# Patient Record
Sex: Female | Born: 1935 | Race: White | Hispanic: No | Smoking: Former smoker
Health system: Southern US, Community
[De-identification: ages and names within clinical notes are randomized; demographics above are authoritative.]

## PROBLEM LIST (undated history)

## (undated) DIAGNOSIS — R350 Frequency of micturition: Secondary | ICD-10-CM

## (undated) DIAGNOSIS — R112 Nausea with vomiting, unspecified: Secondary | ICD-10-CM

## (undated) DIAGNOSIS — T884XXA Failed or difficult intubation, initial encounter: Secondary | ICD-10-CM

## (undated) DIAGNOSIS — I10 Essential (primary) hypertension: Secondary | ICD-10-CM

## (undated) DIAGNOSIS — E079 Disorder of thyroid, unspecified: Secondary | ICD-10-CM

## (undated) DIAGNOSIS — Z9889 Other specified postprocedural states: Secondary | ICD-10-CM

## (undated) DIAGNOSIS — M1990 Unspecified osteoarthritis, unspecified site: Secondary | ICD-10-CM

## (undated) DIAGNOSIS — C801 Malignant (primary) neoplasm, unspecified: Secondary | ICD-10-CM

## (undated) DIAGNOSIS — K219 Gastro-esophageal reflux disease without esophagitis: Secondary | ICD-10-CM

## (undated) DIAGNOSIS — E039 Hypothyroidism, unspecified: Secondary | ICD-10-CM

## (undated) DIAGNOSIS — M810 Age-related osteoporosis without current pathological fracture: Secondary | ICD-10-CM

## (undated) HISTORY — PX: PAROTIDECTOMY: SUR1003

## (undated) HISTORY — PX: ABDOMINAL HYSTERECTOMY: SHX81

## (undated) HISTORY — DX: Unspecified osteoarthritis, unspecified site: M19.90

## (undated) HISTORY — PX: CAROTID BODY TUMOR EXCISION: SHX5156

## (undated) HISTORY — PX: COLONOSCOPY W/ POLYPECTOMY: SHX1380

## (undated) HISTORY — DX: Age-related osteoporosis without current pathological fracture: M81.0

## (undated) HISTORY — DX: Gastro-esophageal reflux disease without esophagitis: K21.9

## (undated) HISTORY — PX: EYE SURGERY: SHX253

## (undated) HISTORY — DX: Essential (primary) hypertension: I10

## (undated) HISTORY — DX: Disorder of thyroid, unspecified: E07.9

---

## 2000-02-19 ENCOUNTER — Other Ambulatory Visit: Admission: RE | Admit: 2000-02-19 | Discharge: 2000-02-19 | Payer: Self-pay | Admitting: Gastroenterology

## 2000-02-22 ENCOUNTER — Encounter (INDEPENDENT_AMBULATORY_CARE_PROVIDER_SITE_OTHER): Payer: Self-pay

## 2000-02-22 ENCOUNTER — Other Ambulatory Visit: Admission: RE | Admit: 2000-02-22 | Discharge: 2000-02-22 | Payer: Self-pay | Admitting: Gastroenterology

## 2000-11-16 ENCOUNTER — Other Ambulatory Visit: Admission: RE | Admit: 2000-11-16 | Discharge: 2000-11-16 | Payer: Self-pay | Admitting: Obstetrics and Gynecology

## 2001-01-23 ENCOUNTER — Other Ambulatory Visit: Admission: RE | Admit: 2001-01-23 | Discharge: 2001-01-23 | Payer: Self-pay | Admitting: Obstetrics and Gynecology

## 2001-01-23 ENCOUNTER — Encounter: Payer: Self-pay | Admitting: Obstetrics and Gynecology

## 2001-01-23 ENCOUNTER — Encounter: Admission: RE | Admit: 2001-01-23 | Discharge: 2001-01-23 | Payer: Self-pay | Admitting: Obstetrics and Gynecology

## 2001-08-11 ENCOUNTER — Other Ambulatory Visit: Admission: RE | Admit: 2001-08-11 | Discharge: 2001-08-11 | Payer: Self-pay | Admitting: *Deleted

## 2002-02-15 ENCOUNTER — Other Ambulatory Visit: Admission: RE | Admit: 2002-02-15 | Discharge: 2002-02-15 | Payer: Self-pay | Admitting: *Deleted

## 2002-12-26 ENCOUNTER — Encounter: Payer: Self-pay | Admitting: Gastroenterology

## 2003-01-16 ENCOUNTER — Encounter: Payer: Self-pay | Admitting: Gastroenterology

## 2003-03-05 ENCOUNTER — Other Ambulatory Visit: Admission: RE | Admit: 2003-03-05 | Discharge: 2003-03-05 | Payer: Self-pay | Admitting: *Deleted

## 2004-03-25 ENCOUNTER — Other Ambulatory Visit: Admission: RE | Admit: 2004-03-25 | Discharge: 2004-03-25 | Payer: Self-pay | Admitting: *Deleted

## 2004-05-10 HISTORY — PX: JOINT REPLACEMENT: SHX530

## 2004-07-16 ENCOUNTER — Other Ambulatory Visit: Admission: RE | Admit: 2004-07-16 | Discharge: 2004-07-16 | Payer: Self-pay | Admitting: *Deleted

## 2005-01-13 ENCOUNTER — Other Ambulatory Visit: Admission: RE | Admit: 2005-01-13 | Discharge: 2005-01-13 | Payer: Self-pay | Admitting: *Deleted

## 2005-01-18 ENCOUNTER — Inpatient Hospital Stay (HOSPITAL_COMMUNITY): Admission: RE | Admit: 2005-01-18 | Discharge: 2005-01-22 | Payer: Self-pay | Admitting: Orthopedic Surgery

## 2005-02-04 ENCOUNTER — Encounter: Payer: Self-pay | Admitting: Gastroenterology

## 2005-02-22 ENCOUNTER — Encounter: Admission: RE | Admit: 2005-02-22 | Discharge: 2005-04-23 | Payer: Self-pay | Admitting: Orthopedic Surgery

## 2005-08-04 ENCOUNTER — Other Ambulatory Visit: Admission: RE | Admit: 2005-08-04 | Discharge: 2005-08-04 | Payer: Self-pay | Admitting: *Deleted

## 2006-06-21 ENCOUNTER — Other Ambulatory Visit: Admission: RE | Admit: 2006-06-21 | Discharge: 2006-06-21 | Payer: Self-pay | Admitting: *Deleted

## 2006-07-05 ENCOUNTER — Ambulatory Visit: Payer: Self-pay | Admitting: Vascular Surgery

## 2007-03-29 ENCOUNTER — Encounter: Payer: Self-pay | Admitting: Gastroenterology

## 2007-07-12 ENCOUNTER — Other Ambulatory Visit: Admission: RE | Admit: 2007-07-12 | Discharge: 2007-07-12 | Payer: Self-pay | Admitting: *Deleted

## 2007-07-20 ENCOUNTER — Ambulatory Visit: Payer: Self-pay | Admitting: Surgery

## 2007-11-22 DIAGNOSIS — K219 Gastro-esophageal reflux disease without esophagitis: Secondary | ICD-10-CM | POA: Insufficient documentation

## 2007-11-22 DIAGNOSIS — Z8719 Personal history of other diseases of the digestive system: Secondary | ICD-10-CM | POA: Insufficient documentation

## 2007-11-22 DIAGNOSIS — K5730 Diverticulosis of large intestine without perforation or abscess without bleeding: Secondary | ICD-10-CM

## 2007-11-22 DIAGNOSIS — K2900 Acute gastritis without bleeding: Secondary | ICD-10-CM | POA: Insufficient documentation

## 2007-11-22 DIAGNOSIS — R1011 Right upper quadrant pain: Secondary | ICD-10-CM

## 2007-11-22 DIAGNOSIS — Z860100 Personal history of colon polyps, unspecified: Secondary | ICD-10-CM | POA: Insufficient documentation

## 2007-11-22 DIAGNOSIS — M810 Age-related osteoporosis without current pathological fracture: Secondary | ICD-10-CM | POA: Insufficient documentation

## 2007-11-22 DIAGNOSIS — D509 Iron deficiency anemia, unspecified: Secondary | ICD-10-CM

## 2007-11-22 DIAGNOSIS — K449 Diaphragmatic hernia without obstruction or gangrene: Secondary | ICD-10-CM

## 2007-11-22 DIAGNOSIS — M940 Chondrocostal junction syndrome [Tietze]: Secondary | ICD-10-CM | POA: Insufficient documentation

## 2007-11-22 DIAGNOSIS — N951 Menopausal and female climacteric states: Secondary | ICD-10-CM | POA: Insufficient documentation

## 2007-12-18 ENCOUNTER — Ambulatory Visit: Payer: Self-pay | Admitting: Gastroenterology

## 2007-12-18 DIAGNOSIS — R1319 Other dysphagia: Secondary | ICD-10-CM

## 2007-12-18 DIAGNOSIS — K828 Other specified diseases of gallbladder: Secondary | ICD-10-CM

## 2007-12-18 DIAGNOSIS — K649 Unspecified hemorrhoids: Secondary | ICD-10-CM

## 2007-12-20 ENCOUNTER — Telehealth: Payer: Self-pay | Admitting: Gastroenterology

## 2007-12-20 ENCOUNTER — Ambulatory Visit (HOSPITAL_COMMUNITY): Admission: RE | Admit: 2007-12-20 | Discharge: 2007-12-20 | Payer: Self-pay | Admitting: Gastroenterology

## 2007-12-22 ENCOUNTER — Ambulatory Visit: Payer: Self-pay | Admitting: Gastroenterology

## 2008-01-16 ENCOUNTER — Telehealth: Payer: Self-pay | Admitting: Gastroenterology

## 2008-01-18 ENCOUNTER — Encounter: Payer: Self-pay | Admitting: Gastroenterology

## 2008-08-14 ENCOUNTER — Ambulatory Visit (HOSPITAL_COMMUNITY): Admission: RE | Admit: 2008-08-14 | Discharge: 2008-08-15 | Payer: Self-pay | Admitting: Otolaryngology

## 2008-08-14 ENCOUNTER — Encounter (INDEPENDENT_AMBULATORY_CARE_PROVIDER_SITE_OTHER): Payer: Self-pay | Admitting: Otolaryngology

## 2009-03-06 ENCOUNTER — Ambulatory Visit (HOSPITAL_COMMUNITY): Admission: RE | Admit: 2009-03-06 | Discharge: 2009-03-06 | Payer: Self-pay | Admitting: Ophthalmology

## 2010-08-13 LAB — BASIC METABOLIC PANEL
Calcium: 9.4 mg/dL (ref 8.4–10.5)
Creatinine, Ser: 0.89 mg/dL (ref 0.4–1.2)
GFR calc Af Amer: >60 mL/min (ref >60)

## 2010-08-13 LAB — HEMOGLOBIN AND HEMATOCRIT, BLOOD
HCT: 34.8 % — ABNORMAL LOW (ref 36.0–46.0)
Hemoglobin: 11.9 g/dL — ABNORMAL LOW (ref 12.0–15.0)

## 2010-08-19 LAB — CBC
HCT: 37.4 % (ref 36.0–46.0)
Hemoglobin: 12.6 g/dL (ref 12.0–15.0)
MCHC: 33.9 g/dL (ref 30.0–36.0)
RBC: 4.17 MIL/uL (ref 3.87–5.11)
RDW: 14.7 % (ref 11.5–15.5)
WBC: 7.4 10*3/uL (ref 4.0–10.5)

## 2010-08-19 LAB — BASIC METABOLIC PANEL
BUN: 20 mg/dL (ref 6–23)
Calcium: 9.5 mg/dL (ref 8.4–10.5)
Creatinine, Ser: 0.98 mg/dL (ref 0.4–1.2)
GFR calc non Af Amer: 56 mL/min — ABNORMAL LOW (ref >60)

## 2010-09-22 NOTE — Op Note (Signed)
NAMEKRISTAIN, FILO                  ACCOUNT NO.:  [id number]   MEDICAL RECORD NO.:  [id number]          PATIENT TYPE:  AMB   LOCATION:  SDS                          FACILITY:  [facility]   PHYSICIAN:  Jefry H. Pollyann Kennedy, MD     DATE OF BIRTH:  Jul 26, 1933   DATE OF PROCEDURE:  DATE OF DISCHARGE:                               OPERATIVE REPORT   PREOPERATIVE DIAGNOSIS:  Left parotid mass.   POSTOPERATIVE DIAGNOSIS:  Left parotid mass.   PROCEDURE:  Left parotidectomy.   SURGEON:  Jefry H. Pollyann Kennedy, MD   ASSISTANT:  Gloris Manchester. Lazarus Salines, MD   ANESTHESIA:  General endotracheal anesthesia was used.   COMPLICATIONS:  No complications.   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  The tumor split out the lower division of the nerve,  but all branches were counted for and none were sacrificed.   REFERRING PHYSICIAN:  Molly Maduro Day, MD   HISTORY:  Several-month history of a left-sided facial mass that was  enlarging.  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  Risks, benefits, alternatives, and complications of the procedure was  explained to the patient who seemed to understand and agreed to surgery.   PROCEDURE:  The patient was taken to the operating room and placed on  the operating table in supine position.  Following induction of the  general endotracheal anesthesia, the table was turned, and the patient  was prepped and draped in the standard fashion for parotidectomy.  A  left preauricular incision was outlined with the marking pen with  continuation up around the lobule and down along the cervical skin  crease about 2 fingerbreadths below the angle of the mandible.  Electrocautery was used for skin incision and for the remainder of the  dissection.  Skin flap was brought anteriorly beyond the gland.  This  was secured in place with silk suture.  The lobule was  dissected off of  the gland as well and tacked back with the suture as well.  Great  auricular nerve was not preserved.  The parotid was dissected off of the  sternocleidomastoid muscle and off of the ear canal, cartilage, and  bone.  The main trunk of the facial nerve was identified and was  carefully followed out towards face using McCabe dissector and bipolar  cautery with scissor dissection.  The upper division was dissected  first, identifying 3 branches, and making sure that the gland was clear  of these branches prior to dissecting and bringing the specimen  inferiorly.  The lower division was as described above splayed out  partially by the tumor.  The lateral lobe of the gland was dissected out  first and then the tumor was dissected out.  All branches were followed  within the [facility] and dissected free of the gland and the tumor.  The  tumor was removed and sent for frozen section evaluation.  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  The  wound was checked for any bleeding with positive pressure ventilation  and bipolar cautery was used as needed for hemostasis.  The facial vein  was ligated between clamps and divided with silk ligature.  Facial nerve  was assessed using a battery operated stimulator and all branches had  good movement.  The wound was closed in layers using 4-0 chromic on the  deep layer and Dermabond on the skin.  A 7 [place] round JP drain was  left in the wound, taken through the inferior aspect of the incision,  secured in place with a nylon suture.  The patient was awakened from  anesthesia and extubated and transferred to recovery in stable  condition.      Jefry H. Pollyann Kennedy, MD  Electronically Signed     JHR/MEDQ  D:  08/14/2008  T:  08/15/2008  Job:  [id number]   cc:   Alfredia Client, MD

## 2010-09-25 NOTE — Op Note (Signed)
NAMELUJUANA, KAPLER                  ACCOUNT NO.:  [id number]   MEDICAL RECORD NO.:  [id number]          PATIENT TYPE:  INP   LOCATION:  5016                         FACILITY:  [facility]   PHYSICIAN:  Feliberto Gottron. Turner Daniels, M.D.   DATE OF BIRTH:  11/25/1933   DATE OF PROCEDURE:  01/18/2005  DATE OF DISCHARGE:                                 OPERATIVE REPORT   PREOPERATIVE DIAGNOSIS:  End-stage osteoarthritis of the right knee.   POSTOPERATIVE DIAGNOSIS:  End-stage osteoarthritis of the right knee.   PROCEDURE:  Right total knee arthroplasty using DePuy sigma RP cemented  components, number 2.5 right femur, number 3 tibia, 10 mm Sigma RP spacer  and a 38 mm patellar button.   SURGEON:  Feliberto Gottron. Turner Daniels, M.D.   ASSISTANT:  Skip Mayer, PA-C.   ANESTHETIC:  General endotracheal.   ESTIMATED BLOOD LOSS:  Minimal.   FLUID REPLACEMENT:  1500 mL crystalloid.   DRAINS PLACED:  Foley catheter and two medium hemostats.   TOURNIQUET TIME:  1 hour and 20 minutes   INDICATIONS FOR PROCEDURE:  77 year old woman followed in our office for end  stage arthritis of the right knee, primarily the patellofemoral joint also  the medial and lateral compartment with very prominent peripheral  osteophytes.  She has failed conservative treatment, anti-inflammatory  medicines, physical therapy, judicious use of narcotics and now desires  elective right total knee arthroplasty to decrease pain and increase  function.  Surgery discussed prior to surgical intervention and all  questions were answered.   DESCRIPTION OF PROCEDURE:  The patient identified by armband, taken the  operating room at [facility] main hospital where the appropriate anesthetic  monitors were attached and general endotracheal anesthesia was induced.  A  lateral post and a foot positioner applied to the table after first applying  a tourniquet high to the right thigh. The right lower extremity then prepped  and draped in the usual sterile fashion  from the ankle to the midthigh and  then using an Esmarch bandage, the limb was exsanguinated, the knee bent and  the tourniquet inflated 350 mmHg. Anterior midline incision about 20 cm in  length was then made from a handbreadth above the patella over the patella  and just distal to and medial to the tibial tubercle through the skin and  subcutaneous tissue. The transverse retinaculum over the patella was then  incised and reflected medially allowing medial parapatellar arthrotomy.  The  patella was everted, the prepatellar fat pad resected and the superficial  medial collateral ligament was elevated off the proximal tibia using the  electrocautery keeping it intact distally stripping over a distance of about  2 cm of the anterior to posterior towards the semimembranosus.  With the  patella everted, the knee was then hyperflexed allowing [place] remove some  exuberant peripheral osteophytes from about the femur and the notch area as  well as the patella which was down to bare bone and eroded and had very  large osteophytes. Once this had been accomplished the tibia was translated  anteriorly using a posterior medial Z  retractor a McHale retractor through  the notch and the lateral Homan retractor.  The proximal tibia was then  instrumented with the DePuy step drill followed by intramedullary rod and 0  degrees posterior slope cutting guide. This was pinned into place allowing  resection of 6 to 7 mm of bone medially and about 8 to 9 mm of bone  laterally. Posterior structures were protected with retractors and after the  tibial resection, we entered the distal femur 2 mm anterior to the PCL  origin with a step drill followed by the intramedullary rod and a 5 degrees  right distal femoral cutting guide set at 10 mm. This was pinned into place  along the epicondylar axis and the distal femoral cut accomplished.  We  measured for a number 2.5 DePuy Sigma RP distal femoral component and placed  the  distal femoral pins in neutral rotation. The chamfer cutting guide was  then brought up and the anterior, posterior and chamfer cuts accomplished  without difficulty followed by the Sigma RP box cut. The everted patella was  then grasped with a patellar cutting guide set at 15 which allowed [place] to  shave just a thin wafer of bone from where the erosion was of the patella  and performing a flat patellar cut. We sized for a 38 mm patellar button and  drilled.  At this point the knee was once again hyperflexed, the tibia  translated forward with a retractor as we sized for #3 tibial tray which was  then pinned into place followed by the smokestack and the reverse conical  ream.  The bullet and Delta fit keel punch was then hammered into place and  we performed trial with a right 2.5 femur, a #3 tibial baseplate, a 10-mm  Sigma RP spacer and 38 mm patellar button. All components fit nicely. The  knee did come to full extension and flexed about 130 degrees limited by  adipose tissue.  At this point the trial components were removed and all  bony surfaces were water picked clean and dried with suction and sponges.  Meanwhile a double batch of the DePuy high viscosity polymethyl methacrylate  cement with 1500 mg of Zinacef was mixed at the back table and applied to  all bony and metallic mating surfaces except for the posterior condyles of  the femur.  In order we then hammered into place the number 3 tibial  component and removed excess cement, the 2.5 right femoral component and  removed excess cement and 38 mm patellar button.  The rotating platform  bearing 10 mm was then inserted and the knee held in 5 degrees of flexion  with compression applied at the foot as the cement cured.  The wound was  then water picked clean once again.  Medium Hemovac drains were placed from  a lateral approach.  The parapatellar arthrotomy closed with running #1 Vicryl suture. The subcutaneous tissue  with 0 and 2-0  undyed Vicryl suture  and the skin with skin staples. A dressing of Xeroform, 4x4 dressing  sponges, Webril and Ace wrap applied. The patient was awakened and taken to  the recovery room without difficulty.      Feliberto Gottron. Turner Daniels, M.D.  Electronically Signed     FJR/MEDQ  D:  01/18/2005  T:  01/19/2005  Job:  [id number]

## 2010-09-25 NOTE — Discharge Summary (Signed)
Darlene Gregory, Darlene Gregory                  ACCOUNT NO.:  [id number]   MEDICAL RECORD NO.:  [id number]          PATIENT TYPE:  INP   LOCATION:  5016                         FACILITY:  [facility]   PHYSICIAN:  Feliberto Gottron. Turner Daniels, M.D.   DATE OF BIRTH:  10-14-1933   DATE OF ADMISSION:  01/18/2005  DATE OF DISCHARGE:  01/22/2005                                 DISCHARGE SUMMARY   PRIMARY DIAGNOSIS THIS ADMISSION:  End-stage degenerative joint disease of  the right knee.   SECONDARY DIAGNOSES:  1.  Urinary tract infection.  2.  History of diverticulitis.  3.  History of esophageal reflux.   HISTORY OF PRESENT ILLNESS:  The patient is a 77 year old woman followed by  Dr. Turner Daniels for end-stage arthritis of the right knee, primarily of the  patellofemoral joint as well as the medial and lateral compartments, with  very prominent peripheral osteophyte.  The patient failed conservative  treatment, anti-inflammatory medications, physical therapy, judicious use of  narcotics, and now desires elective right total knee arthroplasty to  decrease pain and increase function.  The surgery was discussed prior to  surgical intervention and all questions were answered and the risks and  benefits were discussed.   The patient is allergic to SULFA and PYRIDIUM.   Medications at the time of admission include omeprazole, Benicar, Fortical  and __________.   PAST MEDICAL HISTORY:  Usual childhood diseases as well as history of  hypertension, DJD, anemia, hiatal hernia.   SURGICAL HISTORY:  Hysterectomy, D&C, ORIF of both legs.  No difficulties  with GET.   SOCIAL HISTORY:  No tobacco, no ethanol, no IV drug abuse.  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   FAMILY HISTORY:  Her mother died at 95 with positive history of coronary  artery disease and hypertension.  Father died in his 26s with a history of  COPD.   REVIEW OF SYSTEMS:  Positive for glasses and difficulty swallowing.  Denies  any shortness of  breath, chest pain or recent illness.   PHYSICAL EXAMINATION:  VITAL SIGNS:  The patient's blood pressure is 160/90,  respirations are 18.  She is afebrile.  She is a 5 foot 4 inch, 180 pound  woman.  HEENT:  Head is normocephalic, atraumatic.  Ears:  TMs are clear.  Eyes:  Pupils equal, round, and reactive to light and accommodation.  Nose patent.  Throat benign.  NECK:  Supple, full range of motion.  CHEST:  Clear to auscultation and percussion.  CARDIAC:  Regular rate and rhythm, no murmurs.  ABDOMEN:  Soft, nontender, bowel sounds 2+.  EXTREMITIES:  Right knee range of motion 0-115 degrees with positive  crepitus, trace effusion.  Stable ligamentously, 10 degrees of varus  deformity.  SKIN:  No breaks in the skin.   X-rays showed bone-on-bone changes with some changes in all compartments.  Preoperative labs including CBC, CMET, chest x-ray, EKG, PT/PTT, were all  within normal limits except for sinus bradycardia in the EKG.   HOSPITAL COURSE:  On the day of admission the patient was taken to  the  operating room at [facility], where she underwent a right total knee  arthroplasty using DePuy Sigma components, a #2.5 right femur, #3 tibia, a  10 mm Sigma spacer and a 38 mm patellar button.  All components cemented,  and the patient was placed on perioperative antibiotics, placed on  postoperative Coumadin prophylaxis with a target INR of 1.5-2.  She had a  medium Hemovac, double-armed, placed into the knee and a Foley catheter  placed preoperatively.  She was placed on a postoperative PCA of Dilaudid  for pain control and physical therapy was begun in the postoperative care  area with CPM.  Postoperative day 1, the patient was awake and alert, no  nausea or vomiting, using incentive spirometer well.  Vital signs were  stable, hemoglobin 10.9.  She was afebrile.  PT 15.3.  Dressing was dry,  drain output was 250 mL.  PAS stockings in place.  She was otherwise stable  and  continued with physical therapy, weightbearing as tolerated with total  knee precautions.  Postoperative day 2, the patient was complaining of poor  appetite because of a sore throat but otherwise doing well.  Hemoglobin  10.7, INR 1.8, T-max 100.3, vital signs are stable, dressing is dry.  Drain  discontinued from the knee, Foley was discontinued as well.  Calf was soft  and nontender.  She continued with physical therapy.  PCA was discontinued  as well.  Postoperative day 3, the patient was feeling definite improvement.  T-max 99.2, hemoglobin 9.7, INR 1.7.  Dressing was dry, wound was benign.  Positive ecchymosis throughout the leg but otherwise calf was soft.  The  patient continued with physical therapy, continued with discharge planning.  Postoperative day 4, the patient was without complaints, was afebrile, vital  signs were stable.  She was alert and oriented.  INR of 1.8.  The dressing  was dry, wound was benign.  Calf was soft, nontender.  She was  neurovascularly intact.  She was stable medically and improved  orthopedically __________ postoperative course from her right total knee  arthroplasty.  She was discharged home in the care of her family, continued  with physical therapy with home health PT, home CPM, home health R.N.  through [facility], who will manage her Coumadin for the next two  weeks with a target INR of 1.5-2.   Medications at the time of discharge include Tylox and Coumadin,  prescriptions written for both.  She will resume her home medications.   Dressing changes daily.  Weightbearing as tolerated with walker and total  knee precautions.   Diet is regular.   She is to return to Dr. Turner Daniels for a follow-up check within one week, calling  [phone] for appointment, sooner if she should have any increase in  temperature, drainage from the wound or fevers greater than 101.      Laural Benes. Jannet Mantis.     Feliberto Gottron. Turner Daniels, M.D.  Electronically  Signed   JBR/MEDQ  D:  03/19/2005  T:  03/20/2005  Job:  [id number]

## 2012-05-09 ENCOUNTER — Other Ambulatory Visit: Payer: Self-pay | Admitting: Gynecology

## 2012-05-09 DIAGNOSIS — R921 Mammographic calcification found on diagnostic imaging of breast: Secondary | ICD-10-CM

## 2012-05-25 ENCOUNTER — Ambulatory Visit
Admission: RE | Admit: 2012-05-25 | Discharge: 2012-05-25 | Disposition: A | Discharge status: Home / Self Care | Payer: Medicare Other | Source: Ambulatory Visit | Attending: Gynecology | Admitting: Gynecology

## 2012-05-25 DIAGNOSIS — R921 Mammographic calcification found on diagnostic imaging of breast: Secondary | ICD-10-CM

## 2012-08-02 ENCOUNTER — Other Ambulatory Visit: Payer: Self-pay | Admitting: *Deleted

## 2012-08-02 MED ORDER — LEVOTHYROXINE SODIUM 25 MCG PO TABS: 25.0000 ug | ORAL_TABLET | Freq: Every day | ORAL | Status: DC

## 2012-08-03 ENCOUNTER — Other Ambulatory Visit: Payer: Self-pay

## 2012-08-03 MED ORDER — LEVOTHYROXINE SODIUM 25 MCG PO TABS: 25.0000 ug | ORAL_TABLET | Freq: Every day | ORAL | Status: DC

## 2012-08-17 ENCOUNTER — Ambulatory Visit: Payer: Medicare Other | Attending: Orthopedic Surgery | Admitting: Physical Therapy

## 2012-08-17 ENCOUNTER — Other Ambulatory Visit (INDEPENDENT_AMBULATORY_CARE_PROVIDER_SITE_OTHER): Payer: Medicare Other

## 2012-08-17 DIAGNOSIS — R5381 Other malaise: Secondary | ICD-10-CM | POA: Insufficient documentation

## 2012-08-17 DIAGNOSIS — E785 Hyperlipidemia, unspecified: Secondary | ICD-10-CM

## 2012-08-17 DIAGNOSIS — IMO0001 Reserved for inherently not codable concepts without codable children: Secondary | ICD-10-CM | POA: Insufficient documentation

## 2012-08-17 DIAGNOSIS — E039 Hypothyroidism, unspecified: Secondary | ICD-10-CM

## 2012-08-17 DIAGNOSIS — R7309 Other abnormal glucose: Secondary | ICD-10-CM

## 2012-08-17 DIAGNOSIS — M25559 Pain in unspecified hip: Secondary | ICD-10-CM | POA: Insufficient documentation

## 2012-08-17 DIAGNOSIS — I10 Essential (primary) hypertension: Secondary | ICD-10-CM

## 2012-08-17 LAB — COMPREHENSIVE METABOLIC PANEL
ALT: 11 U/L (ref 0–35)
AST: 12 U/L (ref 0–37)
Albumin: 4.6 g/dL (ref 3.5–5.2)
BUN: 20 mg/dL (ref 6–23)
Calcium: 10 mg/dL (ref 8.4–10.5)
Chloride: 100 mEq/L (ref 96–112)
Potassium: 3.9 mEq/L (ref 3.5–5.3)
Sodium: 138 mEq/L (ref 135–145)
Total Protein: 7.2 g/dL (ref 6.0–8.3)

## 2012-08-17 LAB — THYROID PANEL WITH TSH: T4, Total: 9.4 ug/dL (ref 5.0–12.5)

## 2012-08-17 NOTE — Progress Notes (Signed)
Patient came in for labs only.

## 2012-08-18 LAB — POCT GLYCOSYLATED HEMOGLOBIN (HGB A1C): Hemoglobin A1C: 5.8

## 2012-08-18 LAB — NMR LIPOPROFILE WITH LIPIDS
HDL Size: 10.1 nm (ref >=9.2)
HDL-C: 71 mg/dL (ref >=40)
LDL (calc): 84 mg/dL (ref <100)
LDL Size: 21.1 nm (ref >20.5)
LP-IR Score: <25 (ref <=45)
Large HDL-P: 14.9 umol/L (ref >=4.8)

## 2012-08-18 NOTE — Addendum Note (Signed)
Addended by: Orma Render F on: 08/18/2012 04:51 PM   Modules accepted: Orders

## 2012-08-21 NOTE — Progress Notes (Signed)
Patient aware of all labs 

## 2012-08-23 ENCOUNTER — Ambulatory Visit: Payer: Medicare Other | Admitting: Physical Therapy

## 2012-08-24 ENCOUNTER — Encounter: Payer: Self-pay | Admitting: Nurse Practitioner

## 2012-08-24 ENCOUNTER — Ambulatory Visit (INDEPENDENT_AMBULATORY_CARE_PROVIDER_SITE_OTHER): Payer: Medicare Other | Admitting: Nurse Practitioner

## 2012-08-24 VITALS — BP 146/74 | HR 63 | Temp 98.4°F | Ht 62.0 in | Wt 187.0 lb

## 2012-08-24 DIAGNOSIS — M810 Age-related osteoporosis without current pathological fracture: Secondary | ICD-10-CM

## 2012-08-24 DIAGNOSIS — E039 Hypothyroidism, unspecified: Secondary | ICD-10-CM

## 2012-08-24 DIAGNOSIS — I10 Essential (primary) hypertension: Secondary | ICD-10-CM

## 2012-08-24 DIAGNOSIS — D649 Anemia, unspecified: Secondary | ICD-10-CM

## 2012-08-24 MED ORDER — ALENDRONATE SODIUM 70 MG PO TABS: 70.0000 mg | ORAL_TABLET | ORAL | Status: DC

## 2012-08-24 NOTE — Progress Notes (Signed)
  Subjective:    Patient ID: Darlene Gregory, female    DOB: 12/10/1933, 79 y.o.   MRN: [id number]  Hypertension This is a chronic problem. The current episode started more than 1 year ago. The problem is unchanged. The problem is controlled. Pertinent negatives include no blurred vision, chest pain, palpitations, peripheral edema or shortness of breath. There are no associated agents to hypertension. There are no known risk factors for coronary artery disease. Past treatments include ACE inhibitors and diuretics. The current treatment provides moderate improvement. Compliance problems include diet and exercise.   Hypothyroidism  This ia a chronic problem. Patient currently on Levothyroxine . Doing well no C/O fatigue.  Pt is here today to discuss lab work and medications. Hemoglobin was 11.9 and Fortical has went up $40 and would like to change to something cheaper. Pt has no other complaints.   Review of Systems  Eyes: Negative for blurred vision.  Respiratory: Negative for shortness of breath.   Cardiovascular: Negative for chest pain and palpitations.  All other systems reviewed and are negative.       Objective:   Physical Exam  Constitutional: She is oriented to person, place, and time. She appears well-developed and well-nourished.  HENT:  Nose: Nose normal.  Mouth/Throat: Oropharynx is clear and moist.  Eyes: EOM are normal.  Neck: Trachea normal, normal range of motion and full passive range of motion without pain. Neck supple. No JVD present. Carotid bruit is not present. No thyromegaly present.  Cardiovascular: Normal rate, regular rhythm, normal heart sounds and intact distal pulses.  Exam reveals no gallop and no friction rub.   No murmur heard. Pulmonary/Chest: Effort normal and breath sounds normal.  Abdominal: Soft. Bowel sounds are normal. She exhibits no distension and no mass. There is no tenderness.  Musculoskeletal: Normal range of motion.  Lymphadenopathy:   She has no cervical adenopathy.  Neurological: She is alert and oriented to person, place, and time. She has normal reflexes.  Skin: Skin is warm and dry.  Psychiatric: She has a normal mood and affect. Her behavior is normal. Judgment and thought content normal.    BP 154/66  Pulse 63  Temp(Src) 98.4 F (36.9 C) (Oral)  Ht 5\' 2"  (1.575 m)  Wt 187 lb (84.823 kg)  BMI 34.19 kg/m2       Assessment & Plan:  Hypertension/ Hypothyroidism/ Osteoporosis/anemia  Low Na+ Diet  And exercise if can  Stop calcitrol spray- Alendronate Qwk as Rx  Labs reviewed at appointment  OTC multivitamin with iron.  Recheck HGB in 2 weeks  Mary-Margaret Daphine Deutscher, FNP

## 2012-08-24 NOTE — Patient Instructions (Addendum)

## 2012-08-24 NOTE — Addendum Note (Signed)
Addended by: Bennie Pierini on: 08/24/2012 04:24 PM   Modules accepted: Orders, Medications

## 2012-08-28 ENCOUNTER — Ambulatory Visit: Payer: Medicare Other | Admitting: Physical Therapy

## 2012-08-31 ENCOUNTER — Encounter: Payer: Medicare Other | Admitting: Physical Therapy

## 2012-09-01 ENCOUNTER — Ambulatory Visit: Payer: Medicare Other | Admitting: Physical Therapy

## 2012-09-04 ENCOUNTER — Ambulatory Visit: Payer: Medicare Other | Admitting: Physical Therapy

## 2012-09-08 ENCOUNTER — Other Ambulatory Visit (INDEPENDENT_AMBULATORY_CARE_PROVIDER_SITE_OTHER): Payer: Medicare Other

## 2012-09-08 DIAGNOSIS — D649 Anemia, unspecified: Secondary | ICD-10-CM

## 2012-09-08 NOTE — Progress Notes (Signed)
Patient came in for labs only.

## 2012-09-15 ENCOUNTER — Other Ambulatory Visit: Payer: Self-pay | Admitting: Family Medicine

## 2012-11-09 ENCOUNTER — Other Ambulatory Visit: Payer: Self-pay | Admitting: Family Medicine

## 2012-11-13 ENCOUNTER — Telehealth: Payer: Self-pay | Admitting: Family Medicine

## 2012-11-13 MED ORDER — LOSARTAN POTASSIUM-HCTZ 100-25 MG PO TABS: 1.0000 | ORAL_TABLET | Freq: Every day | ORAL | Status: DC

## 2012-11-13 NOTE — Telephone Encounter (Signed)
Appt given with MMM. Refilled BP x 1 because patient completely out

## 2012-11-16 ENCOUNTER — Ambulatory Visit (INDEPENDENT_AMBULATORY_CARE_PROVIDER_SITE_OTHER): Payer: Medicare Other | Admitting: Nurse Practitioner

## 2012-11-16 ENCOUNTER — Encounter: Payer: Self-pay | Admitting: Nurse Practitioner

## 2012-11-16 VITALS — BP 157/75 | HR 52 | Temp 97.4°F | Ht 62.0 in | Wt 184.0 lb

## 2012-11-16 DIAGNOSIS — M810 Age-related osteoporosis without current pathological fracture: Secondary | ICD-10-CM

## 2012-11-16 DIAGNOSIS — K219 Gastro-esophageal reflux disease without esophagitis: Secondary | ICD-10-CM

## 2012-11-16 DIAGNOSIS — R7309 Other abnormal glucose: Secondary | ICD-10-CM

## 2012-11-16 DIAGNOSIS — I10 Essential (primary) hypertension: Secondary | ICD-10-CM

## 2012-11-16 DIAGNOSIS — E039 Hypothyroidism, unspecified: Secondary | ICD-10-CM

## 2012-11-16 DIAGNOSIS — E785 Hyperlipidemia, unspecified: Secondary | ICD-10-CM

## 2012-11-16 DIAGNOSIS — D649 Anemia, unspecified: Secondary | ICD-10-CM

## 2012-11-16 LAB — POCT HEMOGLOBIN: Hemoglobin: 12.5 g/dL (ref 12.2–16.2)

## 2012-11-16 MED ORDER — MELOXICAM 7.5 MG PO TABS: 7.5000 mg | ORAL_TABLET | Freq: Every day | ORAL | Status: DC

## 2012-11-16 MED ORDER — LOSARTAN POTASSIUM-HCTZ 100-25 MG PO TABS: 1.0000 | ORAL_TABLET | Freq: Every day | ORAL | Status: DC

## 2012-11-16 MED ORDER — OMEPRAZOLE 40 MG PO CPDR: 40.0000 mg | DELAYED_RELEASE_CAPSULE | Freq: Every day | ORAL | Status: DC

## 2012-11-16 MED ORDER — LEVOTHYROXINE SODIUM 25 MCG PO TABS: 25.0000 ug | ORAL_TABLET | Freq: Every day | ORAL | Status: DC

## 2012-11-16 MED ORDER — ALENDRONATE SODIUM 70 MG PO TABS: 70.0000 mg | ORAL_TABLET | ORAL | Status: DC

## 2012-11-16 NOTE — Progress Notes (Signed)
Subjective:    Patient ID: Darlene Gregory, female    DOB: 1933/10/06, 79 y.o.   MRN: [id number]  HPI  Patient in today for follow up of chronic medical problems- She is doing quite well- Her hgb has been on and she has been on slowfe iron tablets which has caused constipation. SH ehas no other complaints today- It is actually to early for her to have her rountine lab work today so we will place a future order for her to come back and have done. Patient Active Problem List   Diagnosis Date Noted  . Essential hypertension, benign 08/24/2012  . HEMORRHOIDS 12/18/2007  . POLYP, GALLBLADDER 12/18/2007  . DYSPHAGIA 12/18/2007  . ANEMIA, IRON DEFICIENCY 11/22/2007  . GERD 11/22/2007  . GASTRITIS, ACUTE 11/22/2007  . HIATAL HERNIA 11/22/2007  . DIVERTICULOSIS, COLON 11/22/2007  . POSTMENOPAUSAL STATUS 11/22/2007  . OSTEOPOROSIS 11/22/2007  . COSTOCHONDRITIS 11/22/2007  . ABDOMINAL PAIN, RIGHT UPPER QUADRANT 11/22/2007  . COLONIC POLYPS, ADENOMATOUS, HX OF 11/22/2007  . SCHATZKI'S RING, HX OF 11/22/2007   Outpatient Encounter Prescriptions as of 11/16/2012  Medication Sig Dispense Refill  . alendronate (FOSAMAX) 70 MG tablet Take 1 tablet (70 mg total) by mouth every 7 (seven) days. Take with a full glass of water on an empty stomach.  4 tablet  11  . IRON PO Take by mouth.      . levothyroxine (LEVOTHROID) 25 MCG tablet Take 1 tablet (25 mcg total) by mouth daily.  90 tablet  2  . loratadine (CLARITIN) 10 MG tablet TAKE ONE TABLET BY MOUTH ONE TIME DAILY  90 tablet  1  . losartan-hydrochlorothiazide (HYZAAR) 100-25 MG per tablet Take 1 tablet by mouth daily.  30 tablet  0  . omeprazole (PRILOSEC) 40 MG capsule TAKE ONE CAPSULE BY MOUTH ONE TIME DAILY  30 capsule  0  . [DISCONTINUED] meloxicam (MOBIC) 15 MG tablet Take 15 mg by mouth daily.      . meloxicam (MOBIC) 7.5 MG tablet        No facility-administered encounter medications on file as of 11/16/2012.       Review of Systems   Constitutional: Negative for fever, activity change, appetite change and fatigue.  HENT: Negative.   Eyes: Negative.   Respiratory: Negative.   Cardiovascular: Negative.   Gastrointestinal: Negative.   Endocrine: Negative.   Genitourinary: Negative.   Musculoskeletal: Negative.   Allergic/Immunologic: Negative.   Neurological: Negative.   Hematological: Negative.   Psychiatric/Behavioral: Negative.        Objective:   Physical Exam  Constitutional: She is oriented to person, place, and time. She appears well-developed and well-nourished.  HENT:  Nose: Nose normal.  Mouth/Throat: Oropharynx is clear and moist.  Eyes: EOM are normal.  Neck: Trachea normal, normal range of motion and full passive range of motion without pain. Neck supple. No JVD present. Carotid bruit is not present. No thyromegaly present.  Cardiovascular: Normal rate, regular rhythm, normal heart sounds and intact distal pulses.  Exam reveals no gallop and no friction rub.   No murmur heard. Pulmonary/Chest: Effort normal and breath sounds normal.  Abdominal: Soft. Bowel sounds are normal. She exhibits no distension and no mass. There is no tenderness.  Musculoskeletal: Normal range of motion.  Lymphadenopathy:    She has no cervical adenopathy.  Neurological: She is alert and oriented to person, place, and time. She has normal reflexes.  Skin: Skin is warm and dry.  Psychiatric: She has a normal mood  and affect. Her behavior is normal. Judgment and thought content normal.    BP 157/75  Pulse 52  Temp(Src) 97.4 F (36.3 C) (Oral)  Ht 5\' 2"  (1.575 m)  Wt 184 lb (83.462 kg)  BMI 33.65 kg/m2       Assessment & Plan:   1. Anemia   2. Essential hypertension, benign   3. Unspecified hypothyroidism   4. Osteoporosis, unspecified   5. Other and unspecified hyperlipidemia   6. Other abnormal glucose    Orders Placed This Encounter  Procedures  . COMPLETE METABOLIC PANEL WITH GFR    Standing Status:  Future     Number of Occurrences:      Standing Expiration Date: 11/28/2012  . NMR Lipoprofile with Lipids    Standing Status: Future     Number of Occurrences:      Standing Expiration Date: 11/28/2012  . POCT hemoglobin   Meds ordered this encounter  Medications  . DISCONTD: meloxicam (MOBIC) 7.5 MG tablet    Sig:   . IRON PO    Sig: Take by mouth.  Marland Kitchen alendronate (FOSAMAX) 70 MG tablet    Sig: Take 1 tablet (70 mg total) by mouth every 7 (seven) days. Take with a full glass of water on an empty stomach.    Dispense:  12 tablet    Refill:  1    Order Specific Question:  Supervising Provider    Answer:  Ernestina Penna [[id number]]  . levothyroxine (LEVOTHROID) 25 MCG tablet    Sig: Take 1 tablet (25 mcg total) by mouth daily.    Dispense:  90 tablet    Refill:  1    Order Specific Question:  Supervising Provider    Answer:  Ernestina Penna [[id number]]  . losartan-hydrochlorothiazide (HYZAAR) 100-25 MG per tablet    Sig: Take 1 tablet by mouth daily.    Dispense:  90 tablet    Refill:  1    Order Specific Question:  Supervising Provider    Answer:  Ernestina Penna [[id number]]  . meloxicam (MOBIC) 7.5 MG tablet    Sig: Take 1 tablet (7.5 mg total) by mouth daily.    Dispense:  90 tablet    Refill:  1    Order Specific Question:  Supervising Provider    Answer:  Ernestina Penna [[id number]]  . omeprazole (PRILOSEC) 40 MG capsule    Sig: Take 1 capsule (40 mg total) by mouth daily.    Dispense:  90 capsule    Refill:  1    Order Specific Question:  Supervising Provider    Answer:  Deborra Medina   Continue all meds Labs to be done next week Diet and exercsie enciurgared fillow up in 3-4 months  Mary-Margaret Daphine Deutscher, FNP

## 2012-11-16 NOTE — Patient Instructions (Signed)

## 2012-11-24 ENCOUNTER — Other Ambulatory Visit: Payer: Medicare Other

## 2012-11-24 ENCOUNTER — Other Ambulatory Visit (INDEPENDENT_AMBULATORY_CARE_PROVIDER_SITE_OTHER): Payer: Medicare Other

## 2012-11-24 DIAGNOSIS — E785 Hyperlipidemia, unspecified: Secondary | ICD-10-CM

## 2012-11-24 DIAGNOSIS — I10 Essential (primary) hypertension: Secondary | ICD-10-CM

## 2012-11-24 LAB — COMPLETE METABOLIC PANEL WITH GFR
CO2: 30 mEq/L (ref 19–32)
Calcium: 9.5 mg/dL (ref 8.4–10.5)
Chloride: 104 mEq/L (ref 96–112)
Creat: 1.1 mg/dL (ref 0.50–1.10)
GFR, Est African American: 56 mL/min — ABNORMAL LOW
GFR, Est Non African American: 49 mL/min — ABNORMAL LOW
Glucose, Bld: 88 mg/dL (ref 70–99)
Total Bilirubin: 0.6 mg/dL (ref 0.3–1.2)
Total Protein: 6 g/dL (ref 6.0–8.3)

## 2012-11-24 NOTE — Progress Notes (Signed)
Patient came in for labs only.

## 2012-11-28 LAB — NMR LIPOPROFILE WITH LIPIDS
HDL Particle Number: 31.4 umol/L (ref >=30.5)
HDL Size: 9.9 nm (ref >=9.2)
HDL-C: 62 mg/dL (ref >=40)
LDL (calc): 68 mg/dL (ref <100)
LDL Size: 21.5 nm (ref >20.5)
Large HDL-P: 11.9 umol/L (ref >=4.8)
Small LDL Particle Number: 159 nmol/L (ref <=527)

## 2013-01-02 ENCOUNTER — Other Ambulatory Visit: Payer: Self-pay

## 2013-01-02 NOTE — Telephone Encounter (Signed)
Patient requesting this  Hasn't had since 2012 per pharmacy    Seen 11/16/12  Nothing noted about cough

## 2013-01-02 NOTE — Telephone Encounter (Signed)
NTBS to get codeine cough meds

## 2013-01-02 NOTE — Telephone Encounter (Signed)
NTBS.

## 2013-03-06 ENCOUNTER — Encounter: Payer: Self-pay | Admitting: Gastroenterology

## 2013-03-19 ENCOUNTER — Encounter: Payer: Self-pay | Admitting: *Deleted

## 2013-03-19 ENCOUNTER — Ambulatory Visit (AMBULATORY_SURGERY_CENTER): Payer: Medicare Other | Admitting: *Deleted

## 2013-03-19 ENCOUNTER — Telehealth: Payer: Self-pay | Admitting: *Deleted

## 2013-03-19 VITALS — Ht 63.5 in | Wt 186.8 lb

## 2013-03-19 DIAGNOSIS — Z86010 Personal history of colonic polyps: Secondary | ICD-10-CM

## 2013-03-19 MED ORDER — MOVIPREP 100 G PO SOLR: ORAL | Status: DC

## 2013-03-19 NOTE — Telephone Encounter (Signed)
Dr.  Jarold Motto: pt scheduled for recall colonoscopy 04/02/13.  She requests an EGD at time of colonoscopy.  She says she "feels like she has something in her throat".  She denies trouble swallowing or choking on her food.  No reflux symptoms.  Last EGD 2009 for chronic GERD.  Please advise.  Thanks, Olegario Messier

## 2013-03-19 NOTE — Telephone Encounter (Signed)
  John: pt is scheduled for colonoscopy and possible EGD on 11/24 with Dr Jarold Motto. She says that she had trouble with intubation with knee replacement in 2006. She was told she had a small airway. She had surgeries before 2006 and since 2006 with no problems with intubation.  Her phone number is: [phone] (M) if you want to talk with her.    Is she okay for L[facility]? Thanks, Olegario Messier

## 2013-03-19 NOTE — Progress Notes (Unsigned)
Patient ID: Darlene Gregory, female   DOB: 11-16-1933, 79 y.o.   MRN: [id number] John: pt is scheduled for colonoscopy and possible EGD on 11/24 with Dr Jarold Motto.  She says that she had trouble with intubation with knee replacement in 2006.  She was told she had a small airway.  She had surgeries before 2006 and since 2006 with no problems with intubation.  Is she okay for LEC?  Thanks, Olegario Messier

## 2013-03-19 NOTE — Telephone Encounter (Signed)
ok 

## 2013-03-20 ENCOUNTER — Encounter: Payer: Self-pay | Admitting: Gastroenterology

## 2013-03-21 NOTE — Telephone Encounter (Signed)
Pt scheduled for Propofol EGD/colon 11/24 Monday at 1:30.  Pt aware.  Updated prep instructions reviewed with pt

## 2013-03-22 NOTE — Telephone Encounter (Signed)
Phone call to patient, LM on VM okay for LEC. Reviewed time patient is to arrive and time of procedure.

## 2013-03-22 NOTE — Telephone Encounter (Signed)
Olegario Messier and French Ana,  I have reviewed this pt's chart and discussed her with Renae Fickle who will be providing her MAC.  She is approved for care at [facility].  Thanks,  Jonny Ruiz

## 2013-03-27 ENCOUNTER — Encounter: Payer: Self-pay | Admitting: Nurse Practitioner

## 2013-03-27 ENCOUNTER — Ambulatory Visit (INDEPENDENT_AMBULATORY_CARE_PROVIDER_SITE_OTHER): Payer: Medicare Other | Admitting: Nurse Practitioner

## 2013-03-27 VITALS — BP 156/66 | HR 61 | Temp 97.4°F | Ht 63.5 in | Wt 183.0 lb

## 2013-03-27 DIAGNOSIS — K219 Gastro-esophageal reflux disease without esophagitis: Secondary | ICD-10-CM

## 2013-03-27 DIAGNOSIS — K2900 Acute gastritis without bleeding: Secondary | ICD-10-CM

## 2013-03-27 DIAGNOSIS — M810 Age-related osteoporosis without current pathological fracture: Secondary | ICD-10-CM

## 2013-03-27 DIAGNOSIS — D649 Anemia, unspecified: Secondary | ICD-10-CM

## 2013-03-27 DIAGNOSIS — I10 Essential (primary) hypertension: Secondary | ICD-10-CM

## 2013-03-27 MED ORDER — HYDROCODONE-HOMATROPINE 5-1.5 MG/5ML PO SYRP: 5.0000 mL | ORAL_SOLUTION | Freq: Three times a day (TID) | ORAL | Status: DC | PRN

## 2013-03-27 MED ORDER — ALPRAZOLAM 0.25 MG PO TABS: 0.2500 mg | ORAL_TABLET | Freq: Two times a day (BID) | ORAL | Status: DC | PRN

## 2013-03-27 NOTE — Patient Instructions (Signed)

## 2013-03-27 NOTE — Progress Notes (Signed)
  Subjective:    Patient ID: Darlene Gregory, female    DOB: 1933-10-22, 79 y.o.   MRN: [id number]  Hypertension This is a chronic problem. The current episode started more than 1 year ago. The problem is unchanged. The problem is controlled. Pertinent negatives include no blurred vision, chest pain, palpitations, peripheral edema or shortness of breath. There are no associated agents to hypertension. There are no known risk factors for coronary artery disease. Past treatments include ACE inhibitors and diuretics. The current treatment provides moderate improvement. Compliance problems include diet and exercise.   Hypothyroidism  This ia a chronic problem. Patient currently on Levothyroxine . Doing well no C/O fatigue. Anemia Paient on slow feed iron tablet and it is expensive.- No constipation GAD Patient says that she is having episodes of anxiety and occasionally needs something to take the edge off.   Review of Systems  Eyes: Negative for blurred vision.  Respiratory: Negative for shortness of breath.   Cardiovascular: Negative for chest pain and palpitations.  All other systems reviewed and are negative.       Objective:   Physical Exam  Constitutional: She is oriented to person, place, and time. She appears well-developed and well-nourished.  HENT:  Nose: Nose normal.  Mouth/Throat: Oropharynx is clear and moist.  Eyes: EOM are normal.  Neck: Trachea normal, normal range of motion and full passive range of motion without pain. Neck supple. No JVD present. Carotid bruit is not present. No thyromegaly present.  Cardiovascular: Normal rate, regular rhythm, normal heart sounds and intact distal pulses.  Exam reveals no gallop and no friction rub.   No murmur heard. Pulmonary/Chest: Effort normal and breath sounds normal.  Abdominal: Soft. Bowel sounds are normal. She exhibits no distension and no mass. There is no tenderness.  Musculoskeletal: Normal range of motion.   Lymphadenopathy:    She has no cervical adenopathy.  Neurological: She is alert and oriented to person, place, and time. She has normal reflexes.  Skin: Skin is warm and dry.  Psychiatric: She has a normal mood and affect. Her behavior is normal. Judgment and thought content normal.    BP 156/66  Pulse 61  Temp(Src) 97.4 F (36.3 C) (Oral)  Ht 5' 3.5" (1.613 m)  Wt 183 lb (83.008 kg)  BMI 31.90 kg/m2       Assessment & Plan:   1. OSTEOPOROSIS   2. GERD   3. GASTRITIS, ACUTE   4. Essential hypertension, benign   5. Anemia    Orders Placed This Encounter  Procedures  . CMP14+EGFR  . NMR, lipoprofile  . Anemia Profile B   Meds ordered this encounter  Medications  . ALPRAZolam (XANAX) 0.25 MG tablet    Sig: Take 1 tablet (0.25 mg total) by mouth 2 (two) times daily as needed for anxiety.    Dispense:  20 tablet    Refill:  0    Order Specific Question:  Supervising Provider    Answer:  Deborra Medina    Continue all meds Labs pending Diet and exercise encouraged Health maintenance reviewed Follow up in 3 months  Mary-Margaret Daphine Deutscher, FNP

## 2013-03-30 LAB — ANEMIA PROFILE B
Basophils Absolute: 0 10*3/uL (ref 0.0–0.2)
Eos: 3 %
Ferritin: 42 ng/mL (ref 15–150)
HCT: 42.2 % (ref 34.0–46.6)
Iron Saturation: 33 % (ref 15–55)
Iron: 105 ug/dL (ref 35–155)
Lymphocytes Absolute: 1.6 10*3/uL (ref 0.7–3.1)
MCH: 32.7 pg (ref 26.6–33.0)
MCV: 100 fL — ABNORMAL HIGH (ref 79–97)
Monocytes Absolute: 0.5 10*3/uL (ref 0.1–0.9)
Monocytes: 7 %
Neutrophils Absolute: 4.4 10*3/uL (ref 1.4–7.0)
Platelets: 201 10*3/uL (ref 150–379)
RBC: 4.22 x10E6/uL (ref 3.77–5.28)
RDW: 13.8 % (ref 12.3–15.4)
TIBC: 314 ug/dL (ref 250–450)
UIBC: 209 ug/dL (ref 150–375)
Vitamin B-12: 812 pg/mL (ref 211–946)
WBC: 6.7 10*3/uL (ref 3.4–10.8)

## 2013-03-30 LAB — CMP14+EGFR
AST: 10 IU/L (ref 0–40)
Albumin/Globulin Ratio: 1.7 (ref 1.1–2.5)
Albumin: 4.1 g/dL (ref 3.5–4.8)
BUN: 18 mg/dL (ref 8–27)
Creatinine, Ser: 1.12 mg/dL — ABNORMAL HIGH (ref 0.57–1.00)
GFR calc Af Amer: 55 mL/min/{1.73_m2} — ABNORMAL LOW (ref >59)
GFR calc non Af Amer: 47 mL/min/{1.73_m2} — ABNORMAL LOW (ref >59)
Globulin, Total: 2.4 g/dL (ref 1.5–4.5)
Glucose: 98 mg/dL (ref 65–99)
Total Bilirubin: 0.5 mg/dL (ref 0.0–1.2)
Total Protein: 6.5 g/dL (ref 6.0–8.5)

## 2013-03-30 LAB — NMR, LIPOPROFILE
Cholesterol: 156 mg/dL (ref <200)
LDL Particle Number: 729 nmol/L (ref <1000)
LDL Size: 21.7 nm (ref >20.5)
Small LDL Particle Number: <90 nmol/L (ref <=527)
Triglycerides by NMR: 53 mg/dL (ref <150)

## 2013-03-30 LAB — SPECIMEN STATUS REPORT

## 2013-04-02 ENCOUNTER — Telehealth: Payer: Self-pay | Admitting: Nurse Practitioner

## 2013-04-02 ENCOUNTER — Ambulatory Visit (AMBULATORY_SURGERY_CENTER): Payer: Medicare Other | Admitting: Gastroenterology

## 2013-04-02 ENCOUNTER — Encounter: Payer: Self-pay | Admitting: Gastroenterology

## 2013-04-02 VITALS — BP 151/73 | HR 64 | Temp 96.9°F | Resp 28 | Ht 63.5 in | Wt 183.0 lb

## 2013-04-02 DIAGNOSIS — K317 Polyp of stomach and duodenum: Secondary | ICD-10-CM

## 2013-04-02 DIAGNOSIS — D126 Benign neoplasm of colon, unspecified: Secondary | ICD-10-CM

## 2013-04-02 DIAGNOSIS — D131 Benign neoplasm of stomach: Secondary | ICD-10-CM

## 2013-04-02 DIAGNOSIS — K219 Gastro-esophageal reflux disease without esophagitis: Secondary | ICD-10-CM

## 2013-04-02 DIAGNOSIS — R1310 Dysphagia, unspecified: Secondary | ICD-10-CM

## 2013-04-02 DIAGNOSIS — Z860100 Personal history of colon polyps, unspecified: Secondary | ICD-10-CM

## 2013-04-02 DIAGNOSIS — K2900 Acute gastritis without bleeding: Secondary | ICD-10-CM

## 2013-04-02 MED ORDER — SODIUM CHLORIDE 0.9 % IV SOLN: 500.0000 mL | INTRAVENOUS | Status: DC

## 2013-04-02 NOTE — Op Note (Signed)
[facility] [address] [place], [address]   ENDOSCOPY PROCEDURE REPORT  PATIENT: Darlene, Gregory  MR#: [id number] BIRTHDATE: [birth year] , 79  yrs. old GENDER: Female ENDOSCOPIST:David Hale Bogus, MD, [facility] REFERRED BY: PROCEDURE DATE:  04/02/2013 PROCEDURE:   EGD w/ biopsy and EGD w/ biopsy for H.pylori ASA CLASS:    Class II INDICATIONS: Chest pain, Epigastric pain, Nausea, and Follow up of esophageal reflux. MEDICATION: There was residual sedation effect present from prior procedure and Propofol (Diprivan) 80 mg IV TOPICAL ANESTHETIC:  DESCRIPTION OF PROCEDURE:   After the risks and benefits of the procedure were explained, informed consent was obtained.  The LB [id number]  endoscope was introduced through the mouth  and advanced to the second portion of the duodenum .  The instrument was slowly withdrawn as the mucosa was fully examined.      DUODENUM: The duodenal mucosa showed no abnormalities in the bulb and second portion of the duodenum.  STOMACH: An innumerable number of fungating semi-pedunculated polyps ranging between 3-56mm in size were found in the gastric body and entire examined stomach.  Multiple biopsies was performed.  Sample obtained for helicobacter pylori testing using PyloriTek test.  ESOPHAGUS: The mucosa of the esophagus appeared normal. Retroflexed views revealed a 5cm. hiatal hernia.    The scope was then withdrawn from the patient and the procedure completed.  COMPLICATIONS: There were no complications.   ENDOSCOPIC IMPRESSION: 1.   The duodenal mucosa showed no abnormalities in the bulb and second portion of the duodenum 2.   Innumerable number of semi-pedunculated polyps ranging between 3-36mm in size were found in the gastric body and entire examined stomach ...hyperplastic polyps,r/o H.pylori 3.   The mucosa of the esophagus appeared normal...large HH noted...treated GERD.  RECOMMENDATIONS: 1.  Await biopsy  results 2.  Continue PPI 3.  Continue current meds    _______________________________ eSigned:  Mardella Layman, MD, [facility] 04/02/2013 2:14 PM   antireflux   PATIENT NAME:  Darlene Gregory MR#: [id number]

## 2013-04-02 NOTE — Progress Notes (Signed)
Report to pacu rn, vss, bbs=clear 

## 2013-04-02 NOTE — Patient Instructions (Signed)

## 2013-04-02 NOTE — Op Note (Signed)
[facility] [address] [place], [address]   COLONOSCOPY PROCEDURE REPORT  PATIENT: Darlene Gregory, Darlene Gregory  MR#: [id number] BIRTHDATE: 01-22-1934 , 79  yrs. old GENDER: Female ENDOSCOPIST: Mardella Layman, MD, [facility] REFERRED BY: PROCEDURE DATE:  04/02/2013 PROCEDURE: First Screening Colonoscopy - Avg.  risk and is 50 yrs.  old or older - No.  Prior Negative Screening - Now for repeat screening. N/A  History of Adenoma - Now for follow-up colonoscopy & has been > or = to 3 yrs.  Yes hx of adenoma.  Has been 3 or more years since last colonoscopy.  Polyps Removed Today? Yes. ASA CLASS:   Class II INDICATIONS:Patient's personal history of adenomatous colon polyps.  MEDICATIONS: Propofol (Diprivan) 270 mg IV  DESCRIPTION OF PROCEDURE:   After the risks benefits and alternatives of the procedure were thoroughly explained, informed consent was obtained.  A digital rectal exam revealed no abnormalities of the rectum.   The LB [id number]  endoscope was introduced through the anus and advanced to the cecum, which was identified by both the appendix and ileocecal valve. No adverse events experienced.   The quality of the prep was adequate.  The instrument was then slowly withdrawn as the colon was fully examined.      COLON FINDINGS: There was moderate diverticulosis noted in the descending colon and sigmoid colon with associated muscular hypertrophy, luminal narrowing and inflammatory changes.   A small firm flat polyp was found in the transverse colon.  A polypectomy was performed using snare cautery.  The resection was complete and the polyp tissue was not retrieved.   A small flat polyp was found. in the base of a tic and was cold snare removed and not retrieved.A 1.2  cm cecal polyp was hot snare removed and recovered.      The time to cecum=3 minutes 53 seconds.  Withdrawal time=8 minutes 09 seconds.  The scope was withdrawn and the procedure  completed. COMPLICATIONS: There were no complications.  ENDOSCOPIC IMPRESSION: 1.   There was moderate diverticulosis noted in the descending colon and sigmoid colon 2.   Small flat polyp was found in the transverse colon; polypectomy was performed using snare cautery 3.   Small flat polyp was found in sigmoid 4.    Prominant cecal polyp snared and recovered  RECOMMENDATIONS: 1.  Await biopsy results 2.  Repeat colonoscopy in 5 years if polyp adenomatous; otherwise 10 years 3.  Continue current medications   eSigned:  Mardella Layman, MD, [facility] 04/02/2013 2:08 PM   cc:

## 2013-04-02 NOTE — Progress Notes (Signed)
Called to room to assist during endoscopic procedure.  Patient ID and intended procedure confirmed with present staff. Received instructions for my participation in the procedure from the performing physician.  

## 2013-04-02 NOTE — Progress Notes (Signed)
Patient did not experience any of the following events: a burn prior to discharge; a fall within the facility; wrong site/side/patient/procedure/implant event; or a hospital transfer or hospital admission upon discharge from the facility. (G8907) Patient did not have preoperative order for IV antibiotic SSI prophylaxis. (G8918)  

## 2013-04-03 ENCOUNTER — Telehealth: Payer: Self-pay

## 2013-04-03 NOTE — Telephone Encounter (Signed)
  Follow up Call-  Call back number 04/02/2013  Post procedure Call Back phone  # [phone]  Permission to leave phone message Yes     Patient questions:  Do you have a fever, pain , or abdominal swelling? no Pain Score  0 *  Have you tolerated food without any problems? yes  Have you been able to return to your normal activities? yes  Do you have any questions about your discharge instructions: Diet   no Medications  no Follow up visit  no  Do you have questions or concerns about your Care? no  Actions: * If pain score is 4 or above: No action needed, pain <4.

## 2013-04-04 ENCOUNTER — Encounter: Payer: Self-pay | Admitting: Gastroenterology

## 2013-04-09 ENCOUNTER — Encounter: Payer: Self-pay | Admitting: Gastroenterology

## 2013-04-12 ENCOUNTER — Telehealth: Payer: Self-pay | Admitting: Gastroenterology

## 2013-04-13 NOTE — Telephone Encounter (Signed)
Pt informed of path results and informed her letter is on the way; pt stated understanding.

## 2013-06-06 ENCOUNTER — Other Ambulatory Visit: Payer: Self-pay | Admitting: Nurse Practitioner

## 2013-06-28 ENCOUNTER — Other Ambulatory Visit: Payer: Self-pay | Admitting: Nurse Practitioner

## 2013-07-06 ENCOUNTER — Telehealth: Payer: Self-pay | Admitting: Nurse Practitioner

## 2013-07-06 NOTE — Telephone Encounter (Signed)
Rescheduled patient appt

## 2013-07-13 ENCOUNTER — Ambulatory Visit: Payer: Medicare Other | Admitting: Nurse Practitioner

## 2013-07-18 ENCOUNTER — Ambulatory Visit (INDEPENDENT_AMBULATORY_CARE_PROVIDER_SITE_OTHER): Payer: Medicare Other | Admitting: Nurse Practitioner

## 2013-07-18 ENCOUNTER — Encounter: Payer: Self-pay | Admitting: Nurse Practitioner

## 2013-07-18 VITALS — BP 158/71 | HR 59 | Temp 97.9°F | Ht 62.5 in | Wt 185.0 lb

## 2013-07-18 DIAGNOSIS — D509 Iron deficiency anemia, unspecified: Secondary | ICD-10-CM

## 2013-07-18 DIAGNOSIS — I10 Essential (primary) hypertension: Secondary | ICD-10-CM

## 2013-07-18 DIAGNOSIS — M810 Age-related osteoporosis without current pathological fracture: Secondary | ICD-10-CM

## 2013-07-18 DIAGNOSIS — E039 Hypothyroidism, unspecified: Secondary | ICD-10-CM

## 2013-07-18 DIAGNOSIS — K219 Gastro-esophageal reflux disease without esophagitis: Secondary | ICD-10-CM

## 2013-07-18 NOTE — Patient Instructions (Signed)

## 2013-07-18 NOTE — Progress Notes (Signed)
  Subjective:    Patient ID: Darlene Gregory, female    DOB: October 31, 1933, 80 y.o.   MRN: [id number]  Patient presents today for follow-up of chronic medical problems. Denies no complaints or any changes.   Hypertension This is a chronic problem. The current episode started more than 1 year ago. The problem is unchanged. The problem is controlled. Pertinent negatives include no blurred vision, chest pain, palpitations, peripheral edema or shortness of breath. There are no associated agents to hypertension. There are no known risk factors for coronary artery disease. Past treatments include ACE inhibitors and diuretics. The current treatment provides moderate improvement. Compliance problems include diet and exercise.   Monitors BP at least once a week, usually 150/78. Considering returning to [employer] class at [facility]. Hypothyroidism  This ia a chronic problem. Patient currently on Levothyroxine 25Mcg.  Anemia Paient on slow feed iron tablet .- No constipation.  GAD Patient states she has not had any episodes of anxiety recently but when she does she works through them. Has not had Xanax at all.   Review of Systems  Constitutional: Negative for fatigue.  Eyes: Negative for blurred vision.  Respiratory: Negative for shortness of breath.   Cardiovascular: Negative for chest pain and palpitations.  Gastrointestinal: Negative for constipation.  Psychiatric/Behavioral: Negative for sleep disturbance.  All other systems reviewed and are negative.      Objective:   Physical Exam  Constitutional: She is oriented to person, place, and time. She appears well-developed and well-nourished.  HENT:  Nose: Nose normal.  Mouth/Throat: Oropharynx is clear and moist.  Cerumen impaction bil   Eyes: EOM are normal.  Neck: Trachea normal, normal range of motion and full passive range of motion without pain. Neck supple. No JVD present. Carotid bruit is not present. No thyromegaly present.   Cardiovascular: Normal rate, regular rhythm and normal heart sounds.  Exam reveals no gallop and no friction rub.   No murmur heard. Pulmonary/Chest: Effort normal and breath sounds normal.  Abdominal: Soft. Bowel sounds are normal. She exhibits no distension and no mass. There is no tenderness.  Musculoskeletal: Normal range of motion.  Lymphadenopathy:    She has no cervical adenopathy.  Neurological: She is alert and oriented to person, place, and time. She has normal reflexes.  Skin: Skin is warm and dry.  Psychiatric: She has a normal mood and affect. Her behavior is normal. Judgment and thought content normal.    BP 158/71  Pulse 59  Temp(Src) 97.9 F (36.6 C) (Oral)  Ht 5' 2.5" (1.588 m)  Wt 185 lb (83.915 kg)  BMI 33.28 kg/m2       Assessment & Plan:   1. OSTEOPOROSIS   2. ANEMIA, IRON DEFICIENCY   3. GERD   4. Essential hypertension, benign   5. Unspecified hypothyroidism    Orders Placed This Encounter  Procedures  . CMP14+EGFR  . NMR, lipoprofile  . Thyroid Panel With TSH  . Anemia Profile B   Labs pending Health maintenance reviewed Diet and exercise encouraged Continue all meds Follow up  In [place], [place]

## 2013-07-20 LAB — ANEMIA PROFILE B
BASOS ABS: 0 10*3/uL (ref 0.0–0.2)
Basos: 1 %
EOS: 3 %
Eosinophils Absolute: 0.2 10*3/uL (ref 0.0–0.4)
Ferritin: 50 ng/mL (ref 15–150)
HCT: 41.6 % (ref 34.0–46.6)
Hemoglobin: 13.8 g/dL (ref 11.1–15.9)
IRON SATURATION: 41 % (ref 15–55)
Immature Grans (Abs): 0 10*3/uL (ref 0.0–0.1)
Immature Granulocytes: 0 %
Iron: 140 ug/dL (ref 35–155)
LYMPHS ABS: 1.5 10*3/uL (ref 0.7–3.1)
LYMPHS: 25 %
MCH: 31.9 pg (ref 26.6–33.0)
MCHC: 33.2 g/dL (ref 31.5–35.7)
MCV: 96 fL (ref 79–97)
MONOCYTES: 9 %
Monocytes Absolute: 0.5 10*3/uL (ref 0.1–0.9)
Neutrophils Absolute: 3.8 10*3/uL (ref 1.4–7.0)
Neutrophils Relative %: 62 %
Platelets: 185 10*3/uL (ref 150–379)
RBC: 4.32 x10E6/uL (ref 3.77–5.28)
RDW: 13.4 % (ref 12.3–15.4)
RETIC CT PCT: 1 % (ref 0.6–2.6)
TIBC: 345 ug/dL (ref 250–450)
UIBC: 205 ug/dL (ref 150–375)
Vitamin B-12: 900 pg/mL (ref 211–946)
WBC: 6.1 10*3/uL (ref 3.4–10.8)

## 2013-07-20 LAB — NMR, LIPOPROFILE
Cholesterol: 180 mg/dL (ref <200)
HDL CHOLESTEROL BY NMR: 71 mg/dL (ref >=40)
HDL Particle Number: 34.5 umol/L (ref >=30.5)
LDL Particle Number: 948 nmol/L (ref <1000)
LDL Size: 21.6 nm (ref >20.5)
LDLC SERPL CALC-MCNC: 96 mg/dL (ref <100)
Small LDL Particle Number: <90 nmol/L (ref <=527)
Triglycerides by NMR: 63 mg/dL (ref <150)

## 2013-07-20 LAB — CMP14+EGFR
ALT: 17 IU/L (ref 0–32)
AST: 19 IU/L (ref 0–40)
Albumin/Globulin Ratio: 1.8 (ref 1.1–2.5)
Albumin: 4.2 g/dL (ref 3.5–4.8)
Alkaline Phosphatase: 100 IU/L (ref 39–117)
BUN/Creatinine Ratio: 15 (ref 11–26)
BUN: 16 mg/dL (ref 8–27)
CALCIUM: 9.7 mg/dL (ref 8.7–10.3)
CHLORIDE: 103 mmol/L (ref 97–108)
CO2: 29 mmol/L (ref 18–29)
CREATININE: 1.05 mg/dL — AB (ref 0.57–1.00)
GFR calc Af Amer: 59 mL/min/{1.73_m2} — ABNORMAL LOW (ref >59)
GFR calc non Af Amer: 51 mL/min/{1.73_m2} — ABNORMAL LOW (ref >59)
Globulin, Total: 2.3 g/dL (ref 1.5–4.5)
Glucose: 98 mg/dL (ref 65–99)
POTASSIUM: 4.9 mmol/L (ref 3.5–5.2)
SODIUM: 146 mmol/L — AB (ref 134–144)
Total Bilirubin: 0.6 mg/dL (ref 0.0–1.2)
Total Protein: 6.5 g/dL (ref 6.0–8.5)

## 2013-07-20 LAB — THYROID PANEL WITH TSH
FREE THYROXINE INDEX: 2.3 (ref 1.2–4.9)
T3 Uptake Ratio: 31 % (ref 24–39)
T4 TOTAL: 7.5 ug/dL (ref 4.5–12.0)
TSH: 4.54 u[IU]/mL — AB (ref 0.450–4.500)

## 2013-08-06 ENCOUNTER — Other Ambulatory Visit: Payer: Self-pay | Admitting: Nurse Practitioner

## 2013-09-05 ENCOUNTER — Other Ambulatory Visit: Payer: Self-pay | Admitting: Nurse Practitioner

## 2013-09-26 ENCOUNTER — Other Ambulatory Visit: Payer: Self-pay | Admitting: Nurse Practitioner

## 2013-11-01 ENCOUNTER — Other Ambulatory Visit: Payer: Self-pay | Admitting: Nurse Practitioner

## 2013-11-05 ENCOUNTER — Ambulatory Visit (INDEPENDENT_AMBULATORY_CARE_PROVIDER_SITE_OTHER): Payer: Medicare Other | Admitting: Nurse Practitioner

## 2013-11-05 ENCOUNTER — Ambulatory Visit: Payer: Medicare Other | Admitting: Nurse Practitioner

## 2013-11-05 ENCOUNTER — Encounter: Payer: Self-pay | Admitting: Nurse Practitioner

## 2013-11-05 VITALS — BP 124/90 | HR 56 | Temp 97.9°F | Ht 62.5 in | Wt 186.6 lb

## 2013-11-05 DIAGNOSIS — Z0000 Encounter for general adult medical examination without abnormal findings: Secondary | ICD-10-CM

## 2013-11-05 DIAGNOSIS — Z23 Encounter for immunization: Secondary | ICD-10-CM

## 2013-11-05 DIAGNOSIS — I10 Essential (primary) hypertension: Secondary | ICD-10-CM

## 2013-11-05 DIAGNOSIS — D509 Iron deficiency anemia, unspecified: Secondary | ICD-10-CM

## 2013-11-05 MED ORDER — HYDROCODONE-HOMATROPINE 5-1.5 MG/5ML PO SYRP: 5.0000 mL | ORAL_SOLUTION | Freq: Three times a day (TID) | ORAL | Status: DC | PRN

## 2013-11-05 NOTE — Addendum Note (Signed)
Addended by: Chevis Pretty on: 11/05/2013 09:21 AM   Modules accepted: Orders

## 2013-11-05 NOTE — Progress Notes (Signed)
  Subjective:    Patient ID: Darlene Gregory, female    DOB: 22-Sep-1933, 80 y.o.   MRN: [id number]  Patient in today for annual physical exam for medicare. She also needs a wellness visit that will need to be scheduled at a later time with our clinical pharmacist- [facility] is doing well today without complaints.  Hypertension This is a chronic problem. The current episode started more than 1 year ago. The problem is unchanged. The problem is controlled. Pertinent negatives include no blurred vision, chest pain, palpitations, peripheral edema or shortness of breath. There are no associated agents to hypertension. There are no known risk factors for coronary artery disease. Past treatments include ACE inhibitors and diuretics. The current treatment provides moderate improvement. Compliance problems include diet and exercise.   Monitors BP at least once a week, usually 150/78. Considering returning to [employer] class at [facility]. Hypothyroidism  This ia a chronic problem. Patient currently on Levothyroxine 25Mcg.  Anemia Paient on slow feed iron tablet .- No constipation.  GAD Patient states she has not had any episodes of anxiety recently but when she does she works through them. Has not had Xanax at all.   Review of Systems  Constitutional: Negative for fatigue.  Eyes: Negative for blurred vision.  Respiratory: Negative for shortness of breath.   Cardiovascular: Negative for chest pain and palpitations.  Gastrointestinal: Negative for constipation.  Psychiatric/Behavioral: Negative for sleep disturbance.  All other systems reviewed and are negative.     Objective:   Physical Exam  Constitutional: She is oriented to person, place, and time. She appears well-developed and well-nourished.  HENT:  Nose: Nose normal.  Mouth/Throat: Oropharynx is clear and moist.  Cerumen impaction bil   Eyes: EOM are normal.  Neck: Trachea normal, normal range of motion and full passive range of motion without  pain. Neck supple. No JVD present. Carotid bruit is not present. No thyromegaly present.  Cardiovascular: Normal rate, regular rhythm and normal heart sounds.  Exam reveals no gallop and no friction rub.   No murmur heard. Pulmonary/Chest: Effort normal and breath sounds normal.  Abdominal: Soft. Bowel sounds are normal. She exhibits no distension and no mass. There is no tenderness.  Musculoskeletal: Normal range of motion.  Lymphadenopathy:    She has no cervical adenopathy.  Neurological: She is alert and oriented to person, place, and time. She has normal reflexes.  Skin: Skin is warm and dry.  Psychiatric: She has a normal mood and affect. Her behavior is normal. Judgment and thought content normal.    BP 124/90  Pulse 56  Temp(Src) 97.9 F (36.6 C) (Oral)  Ht 5' 2.5" (1.588 m)  Wt 186 lb 9.6 oz (84.641 kg)  BMI 33.56 kg/m2  EKG- normal-[name], FNP      Assessment & Plan:   1. Encounter for annual physical exam   2. Essential hypertension, benign   3. OSTEOPOROSIS   4. Gastroesophageal reflux disease without esophagitis   5. ANEMIA, IRON DEFICIENCY    Orders Placed This Encounter  Procedures  . Tdap vaccine greater than or equal to 7yo IM  . CMP14+EGFR  . NMR, lipoprofile  . Anemia Profile B  . EKG 12-Lead   Patient to make appointment with clinical pharmacist for wellness visit. Labs pending Health maintenance reviewed Diet and exercise encouraged Continue all meds Follow up  In 3 months   [place], FNP

## 2013-11-06 LAB — ANEMIA PROFILE B
BASOS: 1 %
Basophils Absolute: 0 10*3/uL (ref 0.0–0.2)
EOS: 4 %
Eosinophils Absolute: 0.2 10*3/uL (ref 0.0–0.4)
Ferritin: 59 ng/mL (ref 15–150)
HCT: 41 % (ref 34.0–46.6)
HEMOGLOBIN: 13.6 g/dL (ref 11.1–15.9)
IMMATURE GRANS (ABS): 0 10*3/uL (ref 0.0–0.1)
Immature Granulocytes: 0 %
Iron Saturation: 25 % (ref 15–55)
Iron: 81 ug/dL (ref 35–155)
LYMPHS: 27 %
Lymphocytes Absolute: 1.4 10*3/uL (ref 0.7–3.1)
MCH: 32.1 pg (ref 26.6–33.0)
MCHC: 33.2 g/dL (ref 31.5–35.7)
MCV: 97 fL (ref 79–97)
Monocytes Absolute: 0.5 10*3/uL (ref 0.1–0.9)
Monocytes: 10 %
Neutrophils Absolute: 3.1 10*3/uL (ref 1.4–7.0)
Neutrophils Relative %: 58 %
Platelets: 184 10*3/uL (ref 150–379)
RBC: 4.24 x10E6/uL (ref 3.77–5.28)
RDW: 13.3 % (ref 12.3–15.4)
RETIC CT PCT: 1 % (ref 0.6–2.6)
TIBC: 326 ug/dL (ref 250–450)
UIBC: 245 ug/dL (ref 150–375)
Vitamin B-12: 767 pg/mL (ref 211–946)
WBC: 5.3 10*3/uL (ref 3.4–10.8)

## 2013-11-06 LAB — CMP14+EGFR
ALK PHOS: 94 IU/L (ref 39–117)
ALT: 14 IU/L (ref 0–32)
AST: 19 IU/L (ref 0–40)
Albumin/Globulin Ratio: 2.1 (ref 1.1–2.5)
Albumin: 4.4 g/dL (ref 3.5–4.8)
BILIRUBIN TOTAL: 0.5 mg/dL (ref 0.0–1.2)
BUN / CREAT RATIO: 18 (ref 11–26)
BUN: 21 mg/dL (ref 8–27)
CO2: 26 mmol/L (ref 18–29)
Calcium: 9.8 mg/dL (ref 8.7–10.3)
Chloride: 101 mmol/L (ref 97–108)
Creatinine, Ser: 1.16 mg/dL — ABNORMAL HIGH (ref 0.57–1.00)
GFR calc non Af Amer: 45 mL/min/{1.73_m2} — ABNORMAL LOW (ref >59)
GFR, EST AFRICAN AMERICAN: 52 mL/min/{1.73_m2} — AB (ref >59)
Globulin, Total: 2.1 g/dL (ref 1.5–4.5)
Glucose: 91 mg/dL (ref 65–99)
POTASSIUM: 4.5 mmol/L (ref 3.5–5.2)
Sodium: 142 mmol/L (ref 134–144)
Total Protein: 6.5 g/dL (ref 6.0–8.5)

## 2013-11-06 LAB — NMR, LIPOPROFILE
CHOLESTEROL: 173 mg/dL (ref 100–199)
HDL Cholesterol by NMR: 72 mg/dL (ref >39)
HDL Particle Number: 34.2 umol/L (ref >=30.5)
LDL Particle Number: 871 nmol/L (ref <1000)
LDL SIZE: 21.7 nm (ref >20.5)
LDLC SERPL CALC-MCNC: 89 mg/dL (ref 0–99)
Small LDL Particle Number: 155 nmol/L (ref <=527)
Triglycerides by NMR: 61 mg/dL (ref 0–149)

## 2013-11-23 ENCOUNTER — Ambulatory Visit: Payer: Medicare Other

## 2013-11-30 ENCOUNTER — Other Ambulatory Visit: Payer: Self-pay | Admitting: Nurse Practitioner

## 2013-12-19 ENCOUNTER — Encounter: Payer: Self-pay | Admitting: Gastroenterology

## 2013-12-28 ENCOUNTER — Other Ambulatory Visit: Payer: Self-pay | Admitting: Nurse Practitioner

## 2014-01-08 ENCOUNTER — Telehealth: Payer: Self-pay | Admitting: *Deleted

## 2014-01-08 MED ORDER — ALPRAZOLAM 0.25 MG PO TABS: 0.2500 mg | ORAL_TABLET | Freq: Two times a day (BID) | ORAL | Status: DC | PRN

## 2014-01-08 NOTE — Telephone Encounter (Signed)
Received request from pharmacy for a refill on alprazolam 0.25mg  tab. Take one tablet po BID prn. Do not seen on current med list. Please advise.  If approved please route to pool B so nurse can phone in to [place]

## 2014-01-08 NOTE — Telephone Encounter (Signed)
Please call in xanax 0.25 1 po BID prn #[phone]  with 0 refills

## 2014-02-01 ENCOUNTER — Other Ambulatory Visit: Payer: Self-pay | Admitting: *Deleted

## 2014-02-01 MED ORDER — LEVOTHYROXINE SODIUM 25 MCG PO TABS: ORAL_TABLET | ORAL | Status: DC

## 2014-02-01 NOTE — Telephone Encounter (Signed)
Patient NTBS for follow up and lab work  

## 2014-02-01 NOTE — Telephone Encounter (Signed)
Last tsh was 07/2013

## 2014-02-13 ENCOUNTER — Other Ambulatory Visit: Payer: Self-pay | Admitting: Orthopedic Surgery

## 2014-02-13 ENCOUNTER — Encounter: Payer: Self-pay | Admitting: Nurse Practitioner

## 2014-02-13 ENCOUNTER — Ambulatory Visit (INDEPENDENT_AMBULATORY_CARE_PROVIDER_SITE_OTHER): Payer: Medicare Other | Admitting: Nurse Practitioner

## 2014-02-13 VITALS — BP 186/60 | HR 60 | Temp 97.8°F | Ht 62.5 in | Wt 186.0 lb

## 2014-02-13 DIAGNOSIS — E039 Hypothyroidism, unspecified: Secondary | ICD-10-CM | POA: Insufficient documentation

## 2014-02-13 DIAGNOSIS — Z23 Encounter for immunization: Secondary | ICD-10-CM

## 2014-02-13 DIAGNOSIS — M810 Age-related osteoporosis without current pathological fracture: Secondary | ICD-10-CM

## 2014-02-13 DIAGNOSIS — E038 Other specified hypothyroidism: Secondary | ICD-10-CM

## 2014-02-13 DIAGNOSIS — I10 Essential (primary) hypertension: Secondary | ICD-10-CM

## 2014-02-13 DIAGNOSIS — K219 Gastro-esophageal reflux disease without esophagitis: Secondary | ICD-10-CM

## 2014-02-13 DIAGNOSIS — K649 Unspecified hemorrhoids: Secondary | ICD-10-CM

## 2014-02-13 DIAGNOSIS — D509 Iron deficiency anemia, unspecified: Secondary | ICD-10-CM

## 2014-02-13 DIAGNOSIS — E034 Atrophy of thyroid (acquired): Secondary | ICD-10-CM

## 2014-02-13 LAB — POCT CBC
GRANULOCYTE PERCENT: 65.9 % (ref 37–80)
HEMATOCRIT: 41.1 % (ref 37.7–47.9)
HEMOGLOBIN: 13.1 g/dL (ref 12.2–16.2)
Lymph, poc: 1.5 (ref 0.6–3.4)
MCH, POC: 29.6 pg (ref 27–31.2)
MCHC: 31.9 g/dL (ref 31.8–35.4)
MCV: 92.7 fL (ref 80–97)
MPV: 7.7 fL (ref 0–99.8)
POC GRANULOCYTE: 3.5 (ref 2–6.9)
POC LYMPH %: 28.8 % (ref 10–50)
Platelet Count, POC: 170 10*3/uL (ref 142–424)
RBC: 4.4 M/uL (ref 4.04–5.48)
RDW, POC: 13.1 %
WBC: 5.3 10*3/uL (ref 4.6–10.2)

## 2014-02-13 NOTE — Progress Notes (Signed)
  Subjective:    Patient ID: Darlene Gregory, female    DOB: 01-Sep-1933, 80 y.o.   MRN: [id number]  Patient is here for chronic disease follow up. No acute complaint today.   Hypertension This is a chronic problem. The current episode started more than 1 year ago. The problem is unchanged. The problem is controlled. Pertinent negatives include no blurred vision, chest pain, palpitations, peripheral edema or shortness of breath. There are no associated agents to hypertension. There are no known risk factors for coronary artery disease. Past treatments include ACE inhibitors and diuretics. The current treatment provides moderate improvement. Compliance problems include diet and exercise.   Monitors BP at least once a week, usually 150/78. Considering returning to [employer] class at [facility] Hypothyroidism  This ia a chronic problem. Patient currently on Levothyroxine 25Mcg.  Anemia Paient on slow feed iron tablet .- No constipation.  GAD Patient states she has not had any episodes of anxiety recently but when she does she works through them. Has not had Xanax at all.   *She has a left knee replacement schedule for Friday 02/22/14.   Review of Systems  Constitutional: Negative for fatigue.  Eyes: Negative for blurred vision.  Respiratory: Negative for shortness of breath.   Cardiovascular: Negative for chest pain and palpitations.  Gastrointestinal: Negative for constipation.  Psychiatric/Behavioral: Negative for sleep disturbance.  All other systems reviewed and are negative.     Objective:   Physical Exam  Constitutional: She is oriented to person, place, and time. She appears well-developed and well-nourished.  HENT:  Nose: Nose normal.  Mouth/Throat: Oropharynx is clear and moist.  Cerumen impaction bil   Eyes: EOM are normal.  Neck: Trachea normal, normal range of motion and full passive range of motion without pain. Neck supple. No JVD present. Carotid bruit is not present. No  thyromegaly present.  Cardiovascular: Normal rate, regular rhythm and normal heart sounds.  Exam reveals no gallop and no friction rub.   No murmur heard. Pulmonary/Chest: Effort normal and breath sounds normal.  Abdominal: Soft. Bowel sounds are normal. She exhibits no distension and no mass. There is no tenderness.  Musculoskeletal: Normal range of motion.  Lymphadenopathy:    She has no cervical adenopathy.  Neurological: She is alert and oriented to person, place, and time. She has normal reflexes.  Skin: Skin is warm and dry.  Psychiatric: She has a normal mood and affect. Her behavior is normal. Judgment and thought content normal.    BP 186/60  Pulse 60  Temp(Src) 97.8 F (36.6 C) (Oral)  Ht 5' 2.5" (1.588 m)  Wt 186 lb (84.369 kg)  BMI 33.46 kg/m2       Assessment & Plan:   1. Osteoporosis  2. Hemorrhoids, unspecified hemorrhoid type  3. Gastroesophageal reflux disease without esophagitis  4. Essential hypertension, benign - NMR, lipoprofile - CMP14+EGFR  5. Hypothyroidism due to acquired atrophy of thyroid - Thyroid Panel With TSH  6. Iron deficiency anemia - POCT CBC    Labs pending Health maintenance reviewed Diet and exercise encouraged Continue all meds Follow up  In 3 months   [place], FNP

## 2014-02-13 NOTE — Patient Instructions (Signed)

## 2014-02-14 ENCOUNTER — Telehealth: Payer: Self-pay | Admitting: Nurse Practitioner

## 2014-02-14 ENCOUNTER — Other Ambulatory Visit: Payer: Self-pay | Admitting: Nurse Practitioner

## 2014-02-14 LAB — CMP14+EGFR
A/G RATIO: 2 (ref 1.1–2.5)
ALK PHOS: 95 IU/L (ref 39–117)
ALT: 14 IU/L (ref 0–32)
AST: 16 IU/L (ref 0–40)
Albumin: 4.3 g/dL (ref 3.5–4.8)
BILIRUBIN TOTAL: 0.6 mg/dL (ref 0.0–1.2)
BUN/Creatinine Ratio: 17 (ref 11–26)
BUN: 20 mg/dL (ref 8–27)
CO2: 27 mmol/L (ref 18–29)
Calcium: 9.5 mg/dL (ref 8.7–10.3)
Chloride: 96 mmol/L — ABNORMAL LOW (ref 97–108)
Creatinine, Ser: 1.21 mg/dL — ABNORMAL HIGH (ref 0.57–1.00)
GFR, EST AFRICAN AMERICAN: 50 mL/min/{1.73_m2} — AB (ref >59)
GFR, EST NON AFRICAN AMERICAN: 43 mL/min/{1.73_m2} — AB (ref >59)
GLUCOSE: 96 mg/dL (ref 65–99)
Globulin, Total: 2.2 g/dL (ref 1.5–4.5)
Potassium: 4.3 mmol/L (ref 3.5–5.2)
SODIUM: 138 mmol/L (ref 134–144)
Total Protein: 6.5 g/dL (ref 6.0–8.5)

## 2014-02-14 LAB — THYROID PANEL WITH TSH
Free Thyroxine Index: 3 (ref 1.2–4.9)
T3 Uptake Ratio: 32 % (ref 24–39)
T4, Total: 9.3 ug/dL (ref 4.5–12.0)
TSH: 5.34 u[IU]/mL — AB (ref 0.450–4.500)

## 2014-02-14 LAB — NMR, LIPOPROFILE
Cholesterol: 154 mg/dL (ref 100–199)
HDL CHOLESTEROL BY NMR: 77 mg/dL (ref >39)
HDL PARTICLE NUMBER: 31.5 umol/L (ref >=30.5)
LDL Particle Number: 585 nmol/L (ref <1000)
LDL Size: 21.1 nm (ref >20.5)
LDLC SERPL CALC-MCNC: 70 mg/dL (ref 0–99)
LP-IR Score: <25 (ref <=45)
Triglycerides by NMR: 36 mg/dL (ref 0–149)

## 2014-02-14 MED ORDER — LEVOTHYROXINE SODIUM 50 MCG PO TABS: 50.0000 ug | ORAL_TABLET | Freq: Every day | ORAL | Status: DC

## 2014-02-14 NOTE — Telephone Encounter (Signed)
Disregard message to take levothyroxine every othe rday- had to increase dose to 26mcg daily and recheck in 6 weeks- rx sent to pharmacy

## 2014-02-15 ENCOUNTER — Telehealth: Payer: Self-pay | Admitting: Nurse Practitioner

## 2014-02-15 NOTE — Telephone Encounter (Signed)
Labs printed and placed up front.

## 2014-02-15 NOTE — Telephone Encounter (Signed)
Left detailed message with instructions.

## 2014-02-21 ENCOUNTER — Telehealth: Payer: Self-pay | Admitting: Nurse Practitioner

## 2014-02-21 NOTE — Telephone Encounter (Signed)
Patient was confused about levothyroxine dose.  TSH was elevated and dose was increase to 37mcg daily.  Patient understands.  Will recheck thyroid panel in 6-8 weeks.  Patient is having knee surgery in 6 weeks.  She states that home health will probably come out for therapy and we might be able to order blood draw.  Otherwise will check when patient about to come in.

## 2014-02-25 ENCOUNTER — Telehealth: Payer: Self-pay | Admitting: *Deleted

## 2014-02-25 NOTE — Telephone Encounter (Signed)
Aware. 

## 2014-02-27 ENCOUNTER — Other Ambulatory Visit: Payer: Self-pay | Admitting: Nurse Practitioner

## 2014-02-28 ENCOUNTER — Encounter (HOSPITAL_COMMUNITY): Payer: Self-pay | Admitting: Pharmacy Technician

## 2014-03-07 ENCOUNTER — Other Ambulatory Visit: Payer: Self-pay | Admitting: Nurse Practitioner

## 2014-03-08 ENCOUNTER — Encounter (HOSPITAL_COMMUNITY)
Admission: RE | Admit: 2014-03-08 | Discharge: 2014-03-08 | Disposition: A | Discharge status: Home / Self Care | Payer: Medicare Other | Source: Ambulatory Visit | Attending: Orthopedic Surgery | Admitting: Orthopedic Surgery

## 2014-03-08 ENCOUNTER — Encounter (HOSPITAL_COMMUNITY): Payer: Self-pay

## 2014-03-08 ENCOUNTER — Ambulatory Visit (HOSPITAL_COMMUNITY)
Admission: RE | Admit: 2014-03-08 | Discharge: 2014-03-08 | Disposition: A | Discharge status: Home / Self Care | Payer: Medicare Other | Source: Ambulatory Visit | Attending: Orthopedic Surgery | Admitting: Orthopedic Surgery

## 2014-03-08 DIAGNOSIS — M129 Arthropathy, unspecified: Secondary | ICD-10-CM | POA: Diagnosis not present

## 2014-03-08 DIAGNOSIS — Z01818 Encounter for other preprocedural examination: Secondary | ICD-10-CM

## 2014-03-08 HISTORY — DX: Failed or difficult intubation, initial encounter: T88.4XXA

## 2014-03-08 HISTORY — DX: Hypothyroidism, unspecified: E03.9

## 2014-03-08 HISTORY — DX: Frequency of micturition: R35.0

## 2014-03-08 LAB — CBC WITH DIFFERENTIAL/PLATELET
BASOS PCT: 1 % (ref 0–1)
Basophils Absolute: 0 10*3/uL (ref 0.0–0.1)
EOS ABS: 0.1 10*3/uL (ref 0.0–0.7)
Eosinophils Relative: 2 % (ref 0–5)
HCT: 40 % (ref 36.0–46.0)
HEMOGLOBIN: 13.5 g/dL (ref 12.0–15.0)
Lymphocytes Relative: 27 % (ref 12–46)
Lymphs Abs: 1.6 10*3/uL (ref 0.7–4.0)
MCH: 31.2 pg (ref 26.0–34.0)
MCHC: 33.8 g/dL (ref 30.0–36.0)
MCV: 92.4 fL (ref 78.0–100.0)
Monocytes Absolute: 0.5 10*3/uL (ref 0.1–1.0)
Monocytes Relative: 8 % (ref 3–12)
NEUTROS PCT: 62 % (ref 43–77)
Neutro Abs: 3.7 10*3/uL (ref 1.7–7.7)
Platelets: 179 10*3/uL (ref 150–400)
RBC: 4.33 MIL/uL (ref 3.87–5.11)
RDW: 12.7 % (ref 11.5–15.5)
WBC: 6 10*3/uL (ref 4.0–10.5)

## 2014-03-08 LAB — COMPREHENSIVE METABOLIC PANEL
ALBUMIN: 3.9 g/dL (ref 3.5–5.2)
ALT: 13 U/L (ref 0–35)
ANION GAP: 12 (ref 5–15)
AST: 17 U/L (ref 0–37)
Alkaline Phosphatase: 94 U/L (ref 39–117)
BUN: 20 mg/dL (ref 6–23)
CO2: 27 mEq/L (ref 19–32)
CREATININE: 1.05 mg/dL (ref 0.50–1.10)
Calcium: 9.6 mg/dL (ref 8.4–10.5)
Chloride: 100 mEq/L (ref 96–112)
GFR calc Af Amer: 57 mL/min — ABNORMAL LOW (ref >90)
GFR calc non Af Amer: 50 mL/min — ABNORMAL LOW (ref >90)
GLUCOSE: 91 mg/dL (ref 70–99)
Potassium: 4.2 mEq/L (ref 3.7–5.3)
Sodium: 139 mEq/L (ref 137–147)
TOTAL PROTEIN: 7 g/dL (ref 6.0–8.3)
Total Bilirubin: 0.5 mg/dL (ref 0.3–1.2)

## 2014-03-08 LAB — ABO/RH: ABO/RH(D): A NEG

## 2014-03-08 LAB — SURGICAL PCR SCREEN
MRSA, PCR: NEGATIVE
STAPHYLOCOCCUS AUREUS: NEGATIVE

## 2014-03-08 LAB — PROTIME-INR
INR: 1.01 (ref 0.00–1.49)
Prothrombin Time: 13.4 seconds (ref 11.6–15.2)

## 2014-03-08 LAB — TYPE AND SCREEN
ABO/RH(D): A NEG
ANTIBODY SCREEN: NEGATIVE

## 2014-03-08 LAB — APTT: aPTT: 36 seconds (ref 24–37)

## 2014-03-09 LAB — URINE CULTURE

## 2014-03-11 ENCOUNTER — Encounter (HOSPITAL_COMMUNITY): Payer: Self-pay

## 2014-03-11 NOTE — Progress Notes (Signed)
Anesthesia Chart Review:  Patient is a 80 year old female scheduled for left TKA on 03/13/14 by Dr. Mayer Camel.  Case is posted for [facility].  She has a history of DIFFICULT INTUBATION.    Other history includes non-smoker, HTN, hypothyroidism, GERD, osteoporosis, arthritis, carotid body tumor excision, right TKA 01/18/05, left parotidectomy 08/14/08. PCP is listed as Darlene Pretty, FNP.  In regards to DIFFICULT INTUBATION history: According to 01/18/05 anesthesia records: DL with [facility] ?] unable to visualize cords. DL with glidescope with III view unable to insert intubating stylet, inserted fast track LMA with blind passage of 7.5 ETT.  Per anesthesiologist Dr. Al Corpus remarks, "Used glidescope -- almost too anterior to see base of glottic opening -- unable to pass stylet."  Awake intubation after topical anesthesia recommended at that time.  However, on 08/14/08, she underwent intravenous induction, mask ventilation without difficulty, direct laryngoscopy with glide scope, grade III view to insert 7.0 ETT.    EKG on 11/05/13 (PCP) is documented as "normal" by Darlene Done, FNP; however, I am currently unable to view image in Epic.  I have contacted medical records in hopes that can scan or fax a copy of that EKG prior to her surgery date.  Preoperative CXR and labs noted.   Based on currently available information, I anticipate that she can proceed as planned.  Chart left for nurse follow-up regarding her 11/05/13 EKG and to have me review if meets anesthesia criteria.  Definitive anesthesia plan following evaluation on the day of surgery.  Darlene Gregory Short Stay Gregory/Anesthesiology Phone [phone] 03/11/2014 12:13 PM

## 2014-03-12 MED ORDER — CEFAZOLIN SODIUM-DEXTROSE 2-3 GM-% IV SOLR
2.0000 g | INTRAVENOUS | Status: AC
  Administered 2014-03-13: 2 g via INTRAVENOUS
  Filled 2014-03-12: qty 50

## 2014-03-12 NOTE — H&P (Signed)
TOTAL KNEE ADMISSION H&P  Patient is being admitted for left total knee arthroplasty.  Subjective:  Chief Complaint:left knee pain.  HPI: Darlene Gregory, 80 y.o. female, has a history of pain and functional disability in the left knee due to arthritis and has failed non-surgical conservative treatments for greater than 12 weeks to includeNSAID's and/or analgesics, corticosteriod injections, viscosupplementation injections, use of assistive devices, weight reduction as appropriate and activity modification.  Onset of symptoms was gradual, starting 5 years ago with gradually worsening course since that time. The patient noted no past surgery on the left knee(s).  Patient currently rates pain in the left knee(s) at 10 out of 10 with activity. Patient has night pain, worsening of pain with activity and weight bearing, pain that interferes with activities of daily living, pain with passive range of motion, crepitus and joint swelling.  Patient has evidence of joint space narrowing by imaging studies.  There is no active infection.  Patient Active Problem List   Diagnosis Date Noted  . Hypothyroidism 02/13/2014  . Essential hypertension, benign 08/24/2012  . Hemorrhoids 12/18/2007  . POLYP, GALLBLADDER 12/18/2007  . DYSPHAGIA 12/18/2007  . Iron deficiency anemia 11/22/2007  . GERD 11/22/2007  . GASTRITIS, ACUTE 11/22/2007  . HIATAL HERNIA 11/22/2007  . DIVERTICULOSIS, COLON 11/22/2007  . POSTMENOPAUSAL STATUS 11/22/2007  . Osteoporosis 11/22/2007  . COSTOCHONDRITIS 11/22/2007  . ABDOMINAL PAIN, RIGHT UPPER QUADRANT 11/22/2007  . COLONIC POLYPS, ADENOMATOUS, HX OF 11/22/2007  . SCHATZKI'S RING, HX OF 11/22/2007   Past Medical History  Diagnosis Date  . Hypertension   . DJD (degenerative joint disease)   . GERD (gastroesophageal reflux disease)   . Osteoporosis   . Thyroid disease   . Frequent urination   . Hypothyroidism   . Difficult intubation     "small trachea"; 01/18/05: glidescope  but unable to pass stylet -->fast track LMA with blind passage for ETT, consider awake intubation; 08/14/08: IV induction and DL with glidesecope for ETT    Past Surgical History  Procedure Laterality Date  . Abdominal hysterectomy    . Carotid body tumor excision    . Joint replacement Right 2006    knee  . Parotidectomy Left   . Colonoscopy w/ polypectomy    . Eye surgery Bilateral     cataract removed    No prescriptions prior to admission   Allergies  Allergen Reactions  . Hyoscyamine Sulfate     REACTION: rash  . Phenazopyridine Hcl Rash  . Sulfonamide Derivatives Rash    History  Substance Use Topics  . Smoking status: Never Smoker   . Smokeless tobacco: Never Used  . Alcohol Use: No    Family History  Problem Relation Age of Onset  . Heart disease Mother   . Emphysema Father      Review of Systems  Constitutional: Positive for weight loss.  HENT: Negative.   Eyes: Negative.   Respiratory: Negative.   Cardiovascular: Negative.   Gastrointestinal: Negative.   Genitourinary: Positive for frequency.  Musculoskeletal: Positive for joint pain.  Skin: Negative.   Neurological: Negative.   Endo/Heme/Allergies: Negative.   Psychiatric/Behavioral: Negative.     Objective:  Physical Exam  Constitutional: She is oriented to person, place, and time. She appears well-developed and well-nourished.  HENT:  Head: Normocephalic and atraumatic.  Eyes: Pupils are equal, round, and reactive to light.  Neck: Normal range of motion. Neck supple.  Cardiovascular: Normal rate and regular rhythm.   Respiratory: Effort normal.  Musculoskeletal:  She exhibits tenderness.  She is 5 foot 3 inches and weighs 185 pounds.  Today, the patient has good strength good range of motion in the right knee.  No noticeable effusion.  The patient's left knee has minimal swelling.  She has obvious crepitus with range of motion.  She has a valgus deformity.  She has a range from 10 to 110.  No  instability.  Her calves are soft and nontender.  She is neurovascularly intact distally.  Neurological: She is alert and oriented to person, place, and time.  Skin: Skin is warm and dry.  Psychiatric: She has a normal mood and affect. Her behavior is normal. Judgment and thought content normal.    Vital signs in last 24 hours:    Labs:   Estimated body mass index is 33.46 kg/(m^2) as calculated from the following:   Height as of 02/13/14: 5' 2.5" (1.588 m).   Weight as of 02/13/14: 84.369 kg (186 lb).   Imaging Review Multiple views of the left knee and ankle are reviewed.  These are brought in from an outside source.  Patient has end-stage arthritis of the left knee with a valgus deformity.  She has bone-on-bone arthritis of the lateral compartment and also the patellofemoral joint.  Patient's x-rays do show an old proximal fibula fracture.  The patient's ankle x-rays do show a healed distal tibia fracture.  Assessment/Plan:  End stage arthritis, left knee   The patient history, physical examination, clinical judgment of the provider and imaging studies are consistent with end stage degenerative joint disease of the left knee(s) and total knee arthroplasty is deemed medically necessary. The treatment options including medical management, injection therapy arthroscopy and arthroplasty were discussed at length. The risks and benefits of total knee arthroplasty were presented and reviewed. The risks due to aseptic loosening, infection, stiffness, patella tracking problems, thromboembolic complications and other imponderables were discussed. The patient acknowledged the explanation, agreed to proceed with the plan and consent was signed. Patient is being admitted for inpatient treatment for surgery, pain control, PT, OT, prophylactic antibiotics, VTE prophylaxis, progressive ambulation and ADL's and discharge planning. The patient is planning to be discharged to skilled nursing facility

## 2014-03-13 ENCOUNTER — Encounter (HOSPITAL_COMMUNITY)
Admission: RE | Disposition: A | Discharge status: Home Health | Payer: Self-pay | Source: Ambulatory Visit | Attending: Orthopedic Surgery

## 2014-03-13 ENCOUNTER — Inpatient Hospital Stay (HOSPITAL_COMMUNITY)
Admission: RE | Admit: 2014-03-13 | Discharge: 2014-03-15 | DRG: 470 | Disposition: A | Discharge status: Home Health | Payer: Medicare Other | Source: Ambulatory Visit | Attending: Orthopedic Surgery | Admitting: Orthopedic Surgery

## 2014-03-13 ENCOUNTER — Inpatient Hospital Stay (HOSPITAL_COMMUNITY): Payer: Medicare Other | Admitting: Anesthesiology

## 2014-03-13 ENCOUNTER — Inpatient Hospital Stay (HOSPITAL_COMMUNITY): Payer: Medicare Other | Admitting: Vascular Surgery

## 2014-03-13 ENCOUNTER — Encounter (HOSPITAL_COMMUNITY): Payer: Self-pay | Admitting: *Deleted

## 2014-03-13 DIAGNOSIS — Z8249 Family history of ischemic heart disease and other diseases of the circulatory system: Secondary | ICD-10-CM | POA: Diagnosis not present

## 2014-03-13 DIAGNOSIS — D62 Acute posthemorrhagic anemia: Secondary | ICD-10-CM | POA: Diagnosis not present

## 2014-03-13 DIAGNOSIS — M810 Age-related osteoporosis without current pathological fracture: Secondary | ICD-10-CM | POA: Diagnosis present

## 2014-03-13 DIAGNOSIS — I10 Essential (primary) hypertension: Secondary | ICD-10-CM | POA: Diagnosis present

## 2014-03-13 DIAGNOSIS — E039 Hypothyroidism, unspecified: Secondary | ICD-10-CM | POA: Diagnosis present

## 2014-03-13 DIAGNOSIS — Z825 Family history of asthma and other chronic lower respiratory diseases: Secondary | ICD-10-CM | POA: Diagnosis not present

## 2014-03-13 DIAGNOSIS — M1712 Unilateral primary osteoarthritis, left knee: Principal | ICD-10-CM | POA: Diagnosis present

## 2014-03-13 DIAGNOSIS — M25562 Pain in left knee: Secondary | ICD-10-CM | POA: Diagnosis present

## 2014-03-13 DIAGNOSIS — K219 Gastro-esophageal reflux disease without esophagitis: Secondary | ICD-10-CM | POA: Diagnosis present

## 2014-03-13 DIAGNOSIS — M1710 Unilateral primary osteoarthritis, unspecified knee: Secondary | ICD-10-CM | POA: Diagnosis present

## 2014-03-13 HISTORY — PX: TOTAL KNEE ARTHROPLASTY: SHX125

## 2014-03-13 SURGERY — ARTHROPLASTY, KNEE, TOTAL
Anesthesia: General | Site: Knee | Laterality: Left

## 2014-03-13 MED ORDER — DEXAMETHASONE SODIUM PHOSPHATE 4 MG/ML IJ SOLN
INTRAMUSCULAR | Status: DC | PRN
  Administered 2014-03-13: 4 mg via INTRAVENOUS

## 2014-03-13 MED ORDER — DOCUSATE SODIUM 100 MG PO CAPS
100.0000 mg | ORAL_CAPSULE | Freq: Two times a day (BID) | ORAL | Status: DC
  Administered 2014-03-13 – 2014-03-15 (×4): 100 mg via ORAL
  Filled 2014-03-13 (×5): qty 1

## 2014-03-13 MED ORDER — METOCLOPRAMIDE HCL 10 MG PO TABS: 5.0000 mg | ORAL_TABLET | Freq: Three times a day (TID) | ORAL | Status: DC | PRN

## 2014-03-13 MED ORDER — FENTANYL CITRATE 0.05 MG/ML IJ SOLN
INTRAMUSCULAR | Status: DC | PRN
  Administered 2014-03-13 (×5): 25 ug via INTRAVENOUS
  Administered 2014-03-13: 50 ug via INTRAVENOUS
  Administered 2014-03-13: 25 ug via INTRAVENOUS
  Administered 2014-03-13: 50 ug via INTRAVENOUS
  Administered 2014-03-13: 25 ug via INTRAVENOUS

## 2014-03-13 MED ORDER — ONDANSETRON HCL 4 MG/2ML IJ SOLN
INTRAMUSCULAR | Status: AC
  Filled 2014-03-13: qty 2

## 2014-03-13 MED ORDER — PROPOFOL 10 MG/ML IV BOLUS
INTRAVENOUS | Status: DC | PRN
  Administered 2014-03-13: 200 mg via INTRAVENOUS

## 2014-03-13 MED ORDER — KCL IN DEXTROSE-NACL 20-5-0.45 MEQ/L-%-% IV SOLN
INTRAVENOUS | Status: DC
  Administered 2014-03-13 – 2014-03-14 (×2): via INTRAVENOUS
  Filled 2014-03-13 (×7): qty 1000

## 2014-03-13 MED ORDER — ZOLPIDEM TARTRATE 5 MG PO TABS: 5.0000 mg | ORAL_TABLET | Freq: Every evening | ORAL | Status: DC | PRN

## 2014-03-13 MED ORDER — PHENOL 1.4 % MT LIQD: 1.0000 | OROMUCOSAL | Status: DC | PRN

## 2014-03-13 MED ORDER — KCL IN DEXTROSE-NACL 20-5-0.2 MEQ/L-%-% IV SOLN
INTRAVENOUS | Status: DC
  Filled 2014-03-13 (×2): qty 1000

## 2014-03-13 MED ORDER — METHOCARBAMOL 1000 MG/10ML IJ SOLN: 500.0000 mg | Freq: Four times a day (QID) | INTRAVENOUS | Status: DC | PRN

## 2014-03-13 MED ORDER — LIDOCAINE HCL (CARDIAC) 10 MG/ML IV SOLN
INTRAVENOUS | Status: DC | PRN
  Administered 2014-03-13: 100 mg via INTRAVENOUS

## 2014-03-13 MED ORDER — HYDROMORPHONE HCL 1 MG/ML IJ SOLN
INTRAMUSCULAR | Status: AC
  Administered 2014-03-13: 0.5 mg via INTRAVENOUS
  Filled 2014-03-13: qty 1

## 2014-03-13 MED ORDER — GLYCOPYRROLATE 0.2 MG/ML IJ SOLN
INTRAMUSCULAR | Status: AC
  Filled 2014-03-13: qty 1

## 2014-03-13 MED ORDER — PANTOPRAZOLE SODIUM 40 MG PO TBEC
80.0000 mg | DELAYED_RELEASE_TABLET | Freq: Every day | ORAL | Status: DC
  Administered 2014-03-14 – 2014-03-15 (×2): 80 mg via ORAL
  Filled 2014-03-13 (×2): qty 2

## 2014-03-13 MED ORDER — GLYCOPYRROLATE 0.2 MG/ML IJ SOLN
INTRAMUSCULAR | Status: DC | PRN
  Administered 2014-03-13: 0.2 mg via INTRAVENOUS

## 2014-03-13 MED ORDER — HYDROMORPHONE HCL 1 MG/ML IJ SOLN
0.5000 mg | INTRAMUSCULAR | Status: DC | PRN
  Administered 2014-03-13: 1 mg via INTRAVENOUS
  Filled 2014-03-13: qty 1

## 2014-03-13 MED ORDER — ASPIRIN EC 325 MG PO TBEC: 325.0000 mg | DELAYED_RELEASE_TABLET | Freq: Two times a day (BID) | ORAL | Status: DC

## 2014-03-13 MED ORDER — SENNOSIDES-DOCUSATE SODIUM 8.6-50 MG PO TABS: 1.0000 | ORAL_TABLET | Freq: Every evening | ORAL | Status: DC | PRN

## 2014-03-13 MED ORDER — FENTANYL CITRATE 0.05 MG/ML IJ SOLN
INTRAMUSCULAR | Status: AC
  Filled 2014-03-13: qty 5

## 2014-03-13 MED ORDER — ONDANSETRON HCL 4 MG/2ML IJ SOLN
INTRAMUSCULAR | Status: DC | PRN
  Administered 2014-03-13: 4 mg via INTRAVENOUS

## 2014-03-13 MED ORDER — OXYCODONE-ACETAMINOPHEN 5-325 MG PO TABS: 1.0000 | ORAL_TABLET | ORAL | Status: DC | PRN

## 2014-03-13 MED ORDER — BUPIVACAINE LIPOSOME 1.3 % IJ SUSP
INTRAMUSCULAR | Status: DC | PRN
  Administered 2014-03-13: 20 mL

## 2014-03-13 MED ORDER — ASPIRIN EC 325 MG PO TBEC
325.0000 mg | DELAYED_RELEASE_TABLET | Freq: Every day | ORAL | Status: DC
  Administered 2014-03-14 – 2014-03-15 (×2): 325 mg via ORAL
  Filled 2014-03-13 (×3): qty 1

## 2014-03-13 MED ORDER — ONDANSETRON HCL 4 MG/2ML IJ SOLN
INTRAMUSCULAR | Status: AC
  Administered 2014-03-13: 4 mg via INTRAVENOUS
  Filled 2014-03-13: qty 2

## 2014-03-13 MED ORDER — LACTATED RINGERS IV SOLN
INTRAVENOUS | Status: DC
  Administered 2014-03-13 (×2): via INTRAVENOUS

## 2014-03-13 MED ORDER — LOSARTAN POTASSIUM 50 MG PO TABS
100.0000 mg | ORAL_TABLET | Freq: Every day | ORAL | Status: DC
  Administered 2014-03-14 – 2014-03-15 (×2): 100 mg via ORAL
  Filled 2014-03-13 (×2): qty 2

## 2014-03-13 MED ORDER — HYDROMORPHONE HCL 1 MG/ML IJ SOLN
0.2500 mg | INTRAMUSCULAR | Status: DC | PRN
  Administered 2014-03-13 (×2): 0.5 mg via INTRAVENOUS

## 2014-03-13 MED ORDER — METHOCARBAMOL 500 MG PO TABS
500.0000 mg | ORAL_TABLET | Freq: Four times a day (QID) | ORAL | Status: DC | PRN
  Administered 2014-03-13 – 2014-03-14 (×2): 500 mg via ORAL
  Filled 2014-03-13 (×2): qty 1

## 2014-03-13 MED ORDER — CEFUROXIME SODIUM 1.5 G IJ SOLR
INTRAMUSCULAR | Status: DC | PRN
  Administered 2014-03-13: 1.5 g

## 2014-03-13 MED ORDER — HYDROCHLOROTHIAZIDE 25 MG PO TABS
25.0000 mg | ORAL_TABLET | Freq: Every day | ORAL | Status: DC
  Administered 2014-03-14 – 2014-03-15 (×2): 25 mg via ORAL
  Filled 2014-03-13 (×2): qty 1

## 2014-03-13 MED ORDER — ALUM & MAG HYDROXIDE-SIMETH 200-200-20 MG/5ML PO SUSP: 30.0000 mL | ORAL | Status: DC | PRN

## 2014-03-13 MED ORDER — TIZANIDINE HCL 2 MG PO CAPS: 2.0000 mg | ORAL_CAPSULE | Freq: Three times a day (TID) | ORAL | Status: DC

## 2014-03-13 MED ORDER — HYDRALAZINE HCL 20 MG/ML IJ SOLN
INTRAMUSCULAR | Status: DC | PRN
  Administered 2014-03-13: 5 mg via INTRAVENOUS
  Administered 2014-03-13: 10 mg via INTRAVENOUS

## 2014-03-13 MED ORDER — TRANEXAMIC ACID 100 MG/ML IV SOLN
1000.0000 mg | INTRAVENOUS | Status: AC
  Administered 2014-03-13: 1000 mg via INTRAVENOUS
  Filled 2014-03-13: qty 10

## 2014-03-13 MED ORDER — BUPIVACAINE LIPOSOME 1.3 % IJ SUSP: 20.0000 mL | Freq: Once | INTRAMUSCULAR | Status: DC

## 2014-03-13 MED ORDER — ONDANSETRON HCL 4 MG PO TABS: 4.0000 mg | ORAL_TABLET | Freq: Four times a day (QID) | ORAL | Status: DC | PRN

## 2014-03-13 MED ORDER — LEVOTHYROXINE SODIUM 50 MCG PO TABS
50.0000 ug | ORAL_TABLET | Freq: Every day | ORAL | Status: DC
  Administered 2014-03-14 – 2014-03-15 (×2): 50 ug via ORAL
  Filled 2014-03-13 (×3): qty 1

## 2014-03-13 MED ORDER — ONDANSETRON HCL 4 MG/2ML IJ SOLN
4.0000 mg | Freq: Four times a day (QID) | INTRAMUSCULAR | Status: DC | PRN
  Administered 2014-03-13: 4 mg via INTRAVENOUS

## 2014-03-13 MED ORDER — DEXAMETHASONE SODIUM PHOSPHATE 4 MG/ML IJ SOLN
INTRAMUSCULAR | Status: AC
  Filled 2014-03-13: qty 1

## 2014-03-13 MED ORDER — ACETAMINOPHEN 325 MG PO TABS: 650.0000 mg | ORAL_TABLET | Freq: Four times a day (QID) | ORAL | Status: DC | PRN

## 2014-03-13 MED ORDER — 0.9 % SODIUM CHLORIDE (POUR BTL) OPTIME
TOPICAL | Status: DC | PRN
  Administered 2014-03-13: 1000 mL

## 2014-03-13 MED ORDER — BUPIVACAINE LIPOSOME 1.3 % IJ SUSP
20.0000 mL | INTRAMUSCULAR | Status: AC
  Administered 2014-03-13: 20 mL
  Filled 2014-03-13: qty 20

## 2014-03-13 MED ORDER — ACETAMINOPHEN 650 MG RE SUPP
650.0000 mg | Freq: Four times a day (QID) | RECTAL | Status: DC | PRN
Start: 2014-03-13 — End: 2014-03-15

## 2014-03-13 MED ORDER — MIDAZOLAM HCL 2 MG/2ML IJ SOLN
INTRAMUSCULAR | Status: AC
  Filled 2014-03-13: qty 2

## 2014-03-13 MED ORDER — LOSARTAN POTASSIUM-HCTZ 100-25 MG PO TABS: 1.0000 | ORAL_TABLET | Freq: Every day | ORAL | Status: DC

## 2014-03-13 MED ORDER — OXYCODONE HCL 5 MG PO TABS
5.0000 mg | ORAL_TABLET | ORAL | Status: DC | PRN
  Administered 2014-03-13 – 2014-03-14 (×4): 5 mg via ORAL
  Administered 2014-03-15: 10 mg via ORAL
  Filled 2014-03-13 (×2): qty 1
  Filled 2014-03-13: qty 2
  Filled 2014-03-13 (×2): qty 1

## 2014-03-13 MED ORDER — ALPRAZOLAM 0.25 MG PO TABS: 0.2500 mg | ORAL_TABLET | Freq: Two times a day (BID) | ORAL | Status: DC | PRN

## 2014-03-13 MED ORDER — SODIUM CHLORIDE 0.9 % IV SOLN: 1000.0000 mg | INTRAVENOUS | Status: AC

## 2014-03-13 MED ORDER — CEFUROXIME SODIUM 1.5 G IJ SOLR
INTRAMUSCULAR | Status: AC
  Filled 2014-03-13: qty 1.5

## 2014-03-13 MED ORDER — METOCLOPRAMIDE HCL 5 MG/ML IJ SOLN
5.0000 mg | Freq: Three times a day (TID) | INTRAMUSCULAR | Status: DC | PRN
  Administered 2014-03-13: 10 mg via INTRAVENOUS
  Filled 2014-03-13: qty 2

## 2014-03-13 MED ORDER — MENTHOL 3 MG MT LOZG: 1.0000 | LOZENGE | OROMUCOSAL | Status: DC | PRN

## 2014-03-13 MED ORDER — FENTANYL CITRATE 0.05 MG/ML IJ SOLN
INTRAMUSCULAR | Status: AC
  Administered 2014-03-13: 100 ug
  Filled 2014-03-13: qty 2

## 2014-03-13 MED ORDER — SODIUM CHLORIDE 0.9 % IJ SOLN
INTRAMUSCULAR | Status: DC | PRN
  Administered 2014-03-13: 40 mL via INTRAVENOUS

## 2014-03-13 MED ORDER — BISACODYL 5 MG PO TBEC: 5.0000 mg | DELAYED_RELEASE_TABLET | Freq: Every day | ORAL | Status: DC | PRN

## 2014-03-13 MED ORDER — SODIUM CHLORIDE 0.9 % IR SOLN
Status: DC | PRN
  Administered 2014-03-13: 1

## 2014-03-13 MED ORDER — DIPHENHYDRAMINE HCL 12.5 MG/5ML PO ELIX: 12.5000 mg | ORAL_SOLUTION | ORAL | Status: DC | PRN

## 2014-03-13 MED ORDER — ONDANSETRON HCL 4 MG/2ML IJ SOLN: 4.0000 mg | Freq: Once | INTRAMUSCULAR | Status: DC | PRN

## 2014-03-13 MED ORDER — DIPHENHYDRAMINE HCL 25 MG PO CAPS: 25.0000 mg | ORAL_CAPSULE | Freq: Every day | ORAL | Status: DC

## 2014-03-13 MED ORDER — FLEET ENEMA 7-19 GM/118ML RE ENEM: 1.0000 | ENEMA | Freq: Once | RECTAL | Status: AC | PRN

## 2014-03-13 SURGICAL SUPPLY — 61 items
BANDAGE ELASTIC 6 VELCRO ST LF (GAUZE/BANDAGES/DRESSINGS) ×3 IMPLANT
BANDAGE ESMARK 6X9 LF (GAUZE/BANDAGES/DRESSINGS) ×1 IMPLANT
BLADE SAG 18X100X1.27 (BLADE) ×3 IMPLANT
BLADE SAW SGTL 13X75X1.27 (BLADE) ×3 IMPLANT
BLADE SURG ROTATE 9660 (MISCELLANEOUS) IMPLANT
BNDG ELASTIC 6X10 VLCR STRL LF (GAUZE/BANDAGES/DRESSINGS) ×3 IMPLANT
BNDG ESMARK 6X9 LF (GAUZE/BANDAGES/DRESSINGS) ×3
BOWL SMART MIX CTS (DISPOSABLE) ×3 IMPLANT
CAPT RP KNEE ×3 IMPLANT
CEMENT HV SMART SET (Cement) ×6 IMPLANT
COVER SURGICAL LIGHT HANDLE (MISCELLANEOUS) ×3 IMPLANT
CUFF TOURNIQUET SINGLE 34IN LL (TOURNIQUET CUFF) IMPLANT
CUFF TOURNIQUET SINGLE 44IN (TOURNIQUET CUFF) IMPLANT
DRAPE EXTREMITY T 121X128X90 (DRAPE) ×3 IMPLANT
DRAPE IMP U-DRAPE 54X76 (DRAPES) ×3 IMPLANT
DRAPE U-SHAPE 47X51 STRL (DRAPES) ×3 IMPLANT
DURAPREP 26ML APPLICATOR (WOUND CARE) ×6 IMPLANT
ELECT REM PT RETURN 9FT ADLT (ELECTROSURGICAL) ×3
ELECTRODE REM PT RTRN 9FT ADLT (ELECTROSURGICAL) ×1 IMPLANT
EVACUATOR 1/8 PVC DRAIN (DRAIN) ×3 IMPLANT
GAUZE SPONGE 4X4 12PLY STRL (GAUZE/BANDAGES/DRESSINGS) ×6 IMPLANT
GAUZE XEROFORM 1X8 LF (GAUZE/BANDAGES/DRESSINGS) ×3 IMPLANT
GLOVE BIO SURGEON STRL SZ7.5 (GLOVE) ×3 IMPLANT
GLOVE BIO SURGEON STRL SZ8.5 (GLOVE) ×3 IMPLANT
GLOVE BIOGEL PI IND STRL 8 (GLOVE) ×1 IMPLANT
GLOVE BIOGEL PI IND STRL 9 (GLOVE) ×1 IMPLANT
GLOVE BIOGEL PI INDICATOR 8 (GLOVE) ×2
GLOVE BIOGEL PI INDICATOR 9 (GLOVE) ×2
GOWN STRL REUS W/ TWL LRG LVL3 (GOWN DISPOSABLE) ×1 IMPLANT
GOWN STRL REUS W/ TWL XL LVL3 (GOWN DISPOSABLE) ×2 IMPLANT
GOWN STRL REUS W/TWL LRG LVL3 (GOWN DISPOSABLE) ×2
GOWN STRL REUS W/TWL XL LVL3 (GOWN DISPOSABLE) ×4
HANDPIECE INTERPULSE COAX TIP (DISPOSABLE) ×2
HOOD PEEL AWAY FACE SHEILD DIS (HOOD) ×6 IMPLANT
KIT BASIN OR (CUSTOM PROCEDURE TRAY) ×3 IMPLANT
KIT ROOM TURNOVER OR (KITS) ×3 IMPLANT
MANIFOLD NEPTUNE II (INSTRUMENTS) ×3 IMPLANT
NDL SAFETY ECLIPSE 18X1.5 (NEEDLE) IMPLANT
NEEDLE 22X1 1/2 (OR ONLY) (NEEDLE) ×3 IMPLANT
NEEDLE HYPO 18GX1.5 SHARP (NEEDLE)
NEEDLE SPNL 18GX3.5 QUINCKE PK (NEEDLE) IMPLANT
NS IRRIG 1000ML POUR BTL (IV SOLUTION) ×3 IMPLANT
PACK TOTAL JOINT (CUSTOM PROCEDURE TRAY) ×3 IMPLANT
PACK UNIVERSAL I (CUSTOM PROCEDURE TRAY) ×3 IMPLANT
PAD ARMBOARD 7.5X6 YLW CONV (MISCELLANEOUS) ×6 IMPLANT
PADDING CAST COTTON 6X4 STRL (CAST SUPPLIES) ×3 IMPLANT
SET HNDPC FAN SPRY TIP SCT (DISPOSABLE) ×1 IMPLANT
SPONGE GAUZE 4X4 12PLY STER LF (GAUZE/BANDAGES/DRESSINGS) ×3 IMPLANT
STAPLER VISISTAT 35W (STAPLE) ×3 IMPLANT
SUCTION FRAZIER TIP 10 FR DISP (SUCTIONS) ×3 IMPLANT
SUT VIC AB 0 CTX 36 (SUTURE) ×2
SUT VIC AB 0 CTX36XBRD ANTBCTR (SUTURE) ×1 IMPLANT
SUT VIC AB 1 CTX 36 (SUTURE) ×2
SUT VIC AB 1 CTX36XBRD ANBCTR (SUTURE) ×1 IMPLANT
SUT VIC AB 2-0 CT1 27 (SUTURE) ×4
SUT VIC AB 2-0 CT1 TAPERPNT 27 (SUTURE) ×2 IMPLANT
SYR 30ML LL (SYRINGE) ×3 IMPLANT
SYR 50ML LL SCALE MARK (SYRINGE) ×3 IMPLANT
TOWEL OR 17X24 6PK STRL BLUE (TOWEL DISPOSABLE) ×3 IMPLANT
TOWEL OR 17X26 10 PK STRL BLUE (TOWEL DISPOSABLE) ×3 IMPLANT
WATER STERILE IRR 1000ML POUR (IV SOLUTION) ×6 IMPLANT

## 2014-03-13 NOTE — Op Note (Signed)
PATIENT ID:      Darlene Gregory  MRN:     [id number] DOB/AGE:    [birth year] / 80 y.o.       OPERATIVE REPORT    DATE OF PROCEDURE:  03/13/2014       PREOPERATIVE DIAGNOSIS:   LEFT KNEE ARTHRITIS       Estimated body mass index is 32.43 kg/(m^2) as calculated from the following:   Height as of 03/08/14: 5' 3.5" (1.613 m).   Weight as of this encounter: 84.369 kg (186 lb).                                                        POSTOPERATIVE DIAGNOSIS:   LEFT KNEE ARTHRITIS                                                                       PROCEDURE:  Procedure(s): LEFT TOTAL KNEE ARTHROPLASTY Using Depuy Sigma RP implants #3L Femur, #3Tibia, 66mm Sigma RP bearing, 38 Patella     SURGEON: Darlene Gregory    ASSISTANT:   Darlene Gregory   (Present and scrubbed throughout the case, critical for assistance with exposure, retraction, instrumentation, and closure.)         ANESTHESIA: GET, ACB, Exparel  DRAINS: 2 medium hemovac in knee   TOURNIQUET TIME: 29HBZ   COMPLICATIONS:  None     SPECIMENS: None   INDICATIONS FOR PROCEDURE: The patient has  LEFT KNEE ARTHRITIS , valgus deformities, XR shows bone on bone arthritis. Patient has failed all conservative measures including anti-inflammatory medicines, narcotics, attempts at  exercise and weight loss, cortisone injections and viscosupplementation.  Risks and benefits of surgery have been discussed, questions answered.   DESCRIPTION OF PROCEDURE: The patient identified by armband, received  IV antibiotics, in the holding area at [facility]. Patient taken to the operating room, appropriate anesthetic  monitors were attached, and general endotracheal anesthesia induced with  the patient in supine position, Foley catheter was inserted. Tourniquet  applied high to the operative thigh. Lateral post and foot positioner  applied to the table, the lower extremity was then prepped and draped  in usual sterile fashion from the ankle to  the tourniquet. Time-out procedure was performed. The limb was wrapped with an Esmarch bandage and the tourniquet inflated to 350 mmHg. We began the operation by making the anterior midline incision starting at handbreadth above the patella going over the patella 1 cm medial to and  4 cm distal to the tibial tubercle. Small bleeders in the skin and the  subcutaneous tissue identified and cauterized. Transverse retinaculum was incised and reflected medially and a medial parapatellar arthrotomy was accomplished. the patella was everted and theprepatellar fat pad resected. The superficial medial collateral  ligament was then elevated from anterior to posterior along the proximal  flare of the tibia and anterior half of the menisci resected. The knee was hyperflexed exposing bone on bone arthritis. Peripheral and notch osteophytes as well as the cruciate ligaments were then resected. We continued to  work our  way around posteriorly along the proximal tibia, and externally  rotated the tibia subluxing it out from underneath the femur. A McHale  retractor was placed through the notch and a lateral Hohmann retractor  placed, and we then drilled through the proximal tibia in line with the  axis of the tibia followed by an intramedullary guide rod and 2-degree  posterior slope cutting guide. The tibial cutting guide was pinned into place  allowing resection of 8 mm of bone medially and about 2 mm of bone  laterally because of her varus deformity. Satisfied with the tibial resection, we then  entered the distal femur 2 mm anterior to the PCL origin with the  intramedullary guide rod and applied the distal femoral cutting guide  set at 43mm, with 5 degrees of valgus. This was pinned along the  epicondylar axis. At this point, the distal femoral cut was accomplished without difficulty. We then sized for a #3L femoral component and pinned the guide in 0 degrees of external rotation.The chamfer cutting guide was  pinned into place. The anterior, posterior, and chamfer cuts were accomplished without difficulty followed by  the box cutting guide and the box cut. We also removed posterior osteophytes from the posterior femoral condyles. At this  time, the knee was brought into full extension. We checked our  extension and flexion gaps and found them symmetric at 54mm.  The patella thickness measured at 25 mm. We set the cutting guide at 15 and removed the posterior 10 mm  of the patella, sized for a 38 button and drilled the lollipop. The knee  was then once again hyperflexed exposing the proximal tibia. We sized for a #3 tibial base plate, applied the smokestack and the conical reamer followed by the the Delta fin keel punch. We then hammered into place the Sigma RP trial femoral component, inserted a 10-mm trial bearing, trial patellar button, and took the knee through range of motion from 0-130 degrees. No thumb pressure was required for patellar  tracking. At this point, all trial components were removed, a double batch of DePuy HV cement with 1500 mg of Zinacef was mixed and applied to all bony metallic mating surfaces except for the posterior condyles of the femur itself. In order, we  hammered into place the tibial tray and removed excess cement, the femoral component and removed excess cement, a 10-mm Sigma RP bearing  was inserted, and the knee brought to full extension with compression.  The patellar button was clamped into place, and excess cement  removed. While the cement cured the wound was irrigated out with normal saline solution pulse lavage, and medium Hemovac drains were placed from an anterolateral  approach. Ligament stability and patellar tracking were checked and found to be excellent. The parapatellar arthrotomy was closed with  running #1 Vicryl suture. The subcutaneous tissue with 0 and 2-0 undyed  Vicryl suture, and the skin with skin staples. A dressing of Xeroform,  4 x 4, dressing  sponges, Webril, and Ace wrap applied. The patient  awakened, extubated, and taken to recovery room without difficulty.   Darlene Gregory 03/13/2014, 1:39 PM  PATIENT ID:      Darlene Gregory  MRN:     [id number] DOB/AGE:    04-Sep-1933 / 80 y.o.

## 2014-03-13 NOTE — Anesthesia Postprocedure Evaluation (Signed)
  Anesthesia Post-op Note  Patient: Darlene Gregory  Procedure(s) Performed: Procedure(s): LEFT TOTAL KNEE ARTHROPLASTY (Left)  Patient Location: PACU  Anesthesia Type: General   Level of Consciousness: awake, alert  and oriented  Airway and Oxygen Therapy: Patient Spontanous Breathing  Post-op Pain: mild  Post-op Assessment: Post-op Vital signs reviewed  Post-op Vital Signs: Reviewed  Last Vitals:  Filed Vitals:   03/13/14 1548  BP: 145/58  Pulse: 80  Temp:   Resp: 10    Complications: No apparent anesthesia complications

## 2014-03-13 NOTE — Anesthesia Procedure Notes (Addendum)
Procedure Name: LMA Insertion Date/Time: 03/13/2014 12:28 PM Performed by: Carola Frost Pre-anesthesia Checklist: Patient identified, Timeout performed, Emergency Drugs available, Suction available and Patient being monitored Patient Re-evaluated:Patient Re-evaluated prior to inductionOxygen Delivery Method: Circle system utilized Preoxygenation: Pre-oxygenation with 100% oxygen Intubation Type: IV induction LMA: LMA inserted LMA Size: 4.0 Placement Confirmation: breath sounds checked- equal and bilateral and positive ETCO2 Tube secured with: Tape Dental Injury: Teeth and Oropharynx as per pre-operative assessment    Anesthesia Regional Block:  Adductor canal block  Pre-Anesthetic Checklist: ,, timeout performed, Correct Patient, Correct Site, Correct Laterality, Correct Procedure, Correct Position, site marked, Risks and benefits discussed,  Surgical consent,  Pre-op evaluation,  At surgeon's request and post-op pain management  Laterality: Left  Prep: chloraprep and alcohol swabs       Needles:  Injection technique: Single-shot  Needle Type: Stimulator Needle - 80        Needle insertion depth: 6 cm   Additional Needles: Adductor canal block Narrative:  Start time: 03/13/2014 12:00 PM End time: 03/13/2014 12:10 PM Injection made incrementally with aspirations every 5 mL.  Performed by: Personally   Additional Notes: Pt accepts procedure w/ risks. 20 cc 0.5% Marcaine w/ epi w/ mild difficulty w/o discomfort. GES

## 2014-03-13 NOTE — Plan of Care (Signed)
Problem: Consults Goal: Diagnosis- Total Joint Replacement Primary Total Knee  Problem: Phase I Progression Outcomes Goal: CMS/Neurovascular status WDL Outcome: Completed/Met Date Met:  03/13/14 Goal: Pain controlled with appropriate interventions Outcome: Completed/Met Date Met:  03/13/14

## 2014-03-13 NOTE — Anesthesia Preprocedure Evaluation (Addendum)
Anesthesia Evaluation  Patient identified by MRN, date of birth, ID band Patient awake    Reviewed: Allergy & Precautions, H&P , NPO status , Patient's Chart, lab work & pertinent test results  History of Anesthesia Complications (+) DIFFICULT AIRWAY  Airway        Dental   Pulmonary          Cardiovascular hypertension, Pt. on medications     Neuro/Psych    GI/Hepatic GERD-  Medicated,  Endo/Other  Hypothyroidism   Renal/GU Renal disease: Gfr 55.     Musculoskeletal  (+) Arthritis -,   Abdominal   Peds  Hematology  (+) anemia , H/H 13/40   Anesthesia Other Findings   Reproductive/Obstetrics                            Anesthesia Physical Anesthesia Plan  ASA: III  Anesthesia Plan: General   Post-op Pain Management:    Induction: Intravenous  Airway Management Planned: LMA and Oral ETT  Additional Equipment:   Intra-op Plan:   Post-operative Plan:   Informed Consent: I have reviewed the patients History and Physical, chart, labs and discussed the procedure including the risks, benefits and alternatives for the proposed anesthesia with the patient or authorized representative who has indicated his/her understanding and acceptance.     Plan Discussed with: Anesthesiologist, CRNA and Surgeon  Anesthesia Plan Comments: (Hx of Difficult airway 2006, but successful intubation 2010 with Glide and 7.0 tube.  Can use LMA, or I be glad to intubate.  Reported easy mask vent 2010)       Anesthesia Quick Evaluation

## 2014-03-13 NOTE — Transfer of Care (Signed)
Immediate Anesthesia Transfer of Care Note  Patient: Darlene Gregory  Procedure(s) Performed: Procedure(s): LEFT TOTAL KNEE ARTHROPLASTY (Left)  Patient Location: PACU  Anesthesia Type:GA combined with regional for post-op pain  Level of Consciousness: awake and alert   Airway & Oxygen Therapy: Patient Spontanous Breathing and Patient connected to nasal cannula oxygen  Post-op Assessment: Report given to PACU RN, Post -op Vital signs reviewed and stable and Patient moving all extremities X 4  Post vital signs: Reviewed and stable  Complications: No apparent anesthesia complications

## 2014-03-13 NOTE — Progress Notes (Signed)
Orthopedic Tech Progress Note Patient Details:  Darlene Gregory 1933/12/20 [id number] Start time 3:40 pm CPM Left Knee CPM Left Knee: On Left Knee Flexion (Degrees): 40 Left Knee Extension (Degrees): 0 Additional Comments: applied ohf to bed   Braulio Bosch 03/13/2014, 3:59 PM

## 2014-03-14 ENCOUNTER — Encounter (HOSPITAL_COMMUNITY): Payer: Self-pay | Admitting: Orthopedic Surgery

## 2014-03-14 LAB — CBC
HCT: 33.5 % — ABNORMAL LOW (ref 36.0–46.0)
Hemoglobin: 11.3 g/dL — ABNORMAL LOW (ref 12.0–15.0)
MCH: 31.7 pg (ref 26.0–34.0)
MCHC: 33.7 g/dL (ref 30.0–36.0)
MCV: 93.8 fL (ref 78.0–100.0)
Platelets: 164 10*3/uL (ref 150–400)
RBC: 3.57 MIL/uL — ABNORMAL LOW (ref 3.87–5.11)
RDW: 13.1 % (ref 11.5–15.5)
WBC: 9.6 10*3/uL (ref 4.0–10.5)

## 2014-03-14 LAB — BASIC METABOLIC PANEL
ANION GAP: 11 (ref 5–15)
BUN: 18 mg/dL (ref 6–23)
CALCIUM: 8.7 mg/dL (ref 8.4–10.5)
CO2: 26 mEq/L (ref 19–32)
CREATININE: 0.97 mg/dL (ref 0.50–1.10)
Chloride: 102 mEq/L (ref 96–112)
GFR calc non Af Amer: 55 mL/min — ABNORMAL LOW (ref >90)
GFR, EST AFRICAN AMERICAN: 63 mL/min — AB (ref >90)
Glucose, Bld: 180 mg/dL — ABNORMAL HIGH (ref 70–99)
Potassium: 4.5 mEq/L (ref 3.7–5.3)
Sodium: 139 mEq/L (ref 137–147)

## 2014-03-14 NOTE — Evaluation (Signed)
Physical Therapy Evaluation Patient Details Name: Darlene Gregory MRN: [id number] DOB: [birth year] Today's Date: 03/14/2014   History of Present Illness  s/p L TKA  Clinical Impression  Pt presents with the below listed impairments and will benefit from skilled PT intervention to increase functional independence with mobility. Plan to d/c home with husband to provide A as needed and HHPT for follow up. Pt overall min to mod A for transfers at this time using RW. Pt expressed desire to have a SPC at d/c but educated on level of A at this time and energy conservation and recommending RW from evaluation this AM. Pt verbalized understanding.    Follow Up Recommendations Home health PT;Supervision for mobility/OOB    Equipment Recommendations  None recommended by PT (Pt already has RW per report)    Recommendations for Other Services OT consult     Precautions / Restrictions Precautions Precautions: Fall Restrictions Weight Bearing Restrictions: Yes LLE Weight Bearing: Weight bearing as tolerated      Mobility  Bed Mobility Overal bed mobility: Needs Assistance Bed Mobility: Supine to Sit     Supine to sit: HOB elevated;Min assist     General bed mobility comments: cues for technique and encouragement to do as much as possible with LLE  Transfers Overall transfer level: Needs assistance Equipment used: Rolling walker (2 wheeled) Transfers: Sit to/from Stand Sit to Stand: Mod assist;Min assist         General transfer comment: mod A initially for sit to stand from bed and min A from [facility]. Cues for hand placement and technique. Encouraged WB through LLE as transferring  Ambulation/Gait Ambulation/Gait assistance: Min assist Ambulation Distance (Feet): 4 Feet Assistive device: Rolling walker (2 wheeled) Gait Pattern/deviations: Step-to pattern;Decreased step length - right;Decreased stance time - left     General Gait Details: Only performed short distance gait to the  recliner this AM  Stairs            Wheelchair Mobility    Modified Rankin (Stroke Patients Only)       Balance Overall balance assessment: Needs assistance   Sitting balance-Leahy Scale: Good       Standing balance-Leahy Scale: Poor Standing balance comment: requires UE support to maintain balance                             Pertinent Vitals/Pain Pain Assessment: 0-10 Pain Score: 5  Pain Location: L knee Pain Descriptors / Indicators: Operative site guarding;Aching Pain Intervention(s): Monitored during session;Other (comment) (RN to give pain medication after therapy)    Home Living Family/patient expects to be discharged to:: Private residence Living Arrangements: Spouse/significant other Available Help at Discharge: Family;Available 24 hours/day Type of Home: House Home Access: Stairs to enter Entrance Stairs-Rails: None Entrance Stairs-Number of Steps: 1 Home Layout: One level Home Equipment: Walker - 2 wheels      Prior Function Level of Independence: Independent               Hand Dominance        Extremity/Trunk Assessment   Upper Extremity Assessment: Defer to OT evaluation           Lower Extremity Assessment: LLE deficits/detail   LLE Deficits / Details: post op pain and weakness; decreased knee ROM and edema noted  Cervical / Trunk Assessment: Normal  Communication   Communication: No difficulties  Cognition Arousal/Alertness: Awake/alert Behavior During Therapy: WFL for tasks assessed/performed Overall Cognitive  Status: Within Functional Limits for tasks assessed (extra time and cues needed for functional mobility)                      General Comments General comments (skin integrity, edema, etc.): educated on elevation and use of ice for LLE edema control and importance of ROM exercises for both flexion and extension. Used extra towels under ankle until knee foam ordered.    Exercises Total Joint  Exercises Ankle Circles/Pumps: AROM;Strengthening;Both;15 reps Quad Sets: Left;Strengthening;10 reps Straight Leg Raises: Strengthening;Left;10 reps;AAROM Long Arc Quad: AROM;Strengthening;Left;15 reps Knee Flexion: AROM;Strengthening;Left;15 reps Goniometric ROM: ~60 degrees seated EOB      Assessment/Plan    PT Assessment Patient needs continued PT services  PT Diagnosis Abnormality of gait;Acute pain   PT Problem List Decreased strength;Decreased range of motion;Decreased activity tolerance;Decreased balance;Decreased mobility;Decreased knowledge of use of DME;Pain  PT Treatment Interventions DME instruction;Gait training;Stair training;Functional mobility training;Therapeutic activities;Therapeutic exercise;Balance training;Neuromuscular re-education;Patient/family education;Modalities   PT Goals (Current goals can be found in the Care Plan section) Acute Rehab PT Goals Patient Stated Goal: go home PT Goal Formulation: With patient Time For Goal Achievement: 03/21/14 Potential to Achieve Goals: Good    Frequency 7X/week (BID)   Barriers to discharge        Co-evaluation               End of Session Equipment Utilized During Treatment: Gait belt Activity Tolerance: Patient tolerated treatment well Patient left: in chair;with call bell/phone within reach Nurse Communication: Mobility status;Weight bearing status;Other (comment) (Pt able to urinate on [facility])         Time: 9211-9417 PT Time Calculation (min): 29 min   Charges:   PT Evaluation $Initial PT Evaluation Tier I: 1 Procedure PT Treatments $Therapeutic Exercise: 8-22 mins   PT G Codes:          Allayne Gitelman 03/14/2014, 9:24 AM

## 2014-03-14 NOTE — Plan of Care (Signed)
Problem: Consults Goal: Total Joint Replacement Patient Education See Patient Education Module for education specifics.  Outcome: Completed/Met Date Met:  03/14/14 Goal: Skin Care Protocol Initiated - if Braden Score 18 or less If consults are not indicated, leave blank or document N/A  Outcome: Completed/Met Date Met:  03/14/14 Goal: Nutrition Consult-if indicated Outcome: Not Applicable Date Met:  45/03/88 Goal: Diabetes Guidelines if Diabetic/Glucose > 140 If diabetic or lab glucose is > 140 mg/dl - Initiate Diabetes/Hyperglycemia Guidelines & Document Interventions  Outcome: Not Applicable Date Met:  82/80/03  Problem: Phase I Progression Outcomes Goal: Dangle or out of bed evening of surgery Outcome: Completed/Met Date Met:  03/14/14

## 2014-03-14 NOTE — Progress Notes (Signed)
Patient ID: Darlene Gregory, female   DOB: 05-09-1934, 80 y.o.   MRN: [id number] PATIENT ID: Darlene Gregory  MRN: [id number]  DOB/AGE:  [birth year] / 80 y.o.  1 Day Post-Op Procedure(s) (LRB): LEFT TOTAL KNEE ARTHROPLASTY (Left)    PROGRESS NOTE Subjective: Patient is alert, oriented, x1 Nausea, no Vomiting, yes passing gas, no Bowel Movement. Taking PO well, eating eggs and bacon. Denies SOB, Chest or Calf Pain. Using Incentive Spirometer, PAS in place. Ambulate WBAT, CPM 0-40 Patient reports pain as 2 on 0-10 scale and 3 on 0-10 scale  .    Objective: Vital signs in last 24 hours: Filed Vitals:   03/14/14 0000 03/14/14 0131 03/14/14 0333 03/14/14 0538  BP:  134/67  136/61  Pulse:  79  86  Temp:  97.8 F (36.6 C)  98.4 F (36.9 C)  TempSrc:      Resp: 14 16 12 14   Height:      Weight:      SpO2: 100% 99% 98% 99%      Intake/Output from previous day: I/O last 3 completed shifts: In: 3718.8 [P.O.:450; I.V.:2593.8; Other:675] Out: 481 [Urine:550; Drains:275; Blood:50]   Intake/Output this shift:     LABORATORY DATA:  Recent Labs  03/14/14 0500  WBC 9.6  HGB 11.3*  HCT 33.5*  PLT 164  NA 139  K 4.5  CL 102  CO2 26  BUN 18  CREATININE 0.97  GLUCOSE 180*  CALCIUM 8.7    Examination: Neurologically intact ABD soft Neurovascular intact Sensation intact distally Intact pulses distally Dorsiflexion/Plantar flexion intact Incision: dressing C/D/I No cellulitis present Compartment soft} Blood and plasma separated in drain indicating minimal recent drainage, drain pulled without difficulty.     Assessment:   1 Day Post-Op Procedure(s) (LRB): LEFT TOTAL KNEE ARTHROPLASTY (Left) ADDITIONAL DIAGNOSIS: Expected Acute Blood Loss Anemia, Hypertension  Plan: PT/OT WBAT, CPM 5/hrs day until ROM 0-90 degrees, then D/C CPM DVT Prophylaxis:  SCDx72hrs, ASA 325 mg BID x 2 weeks DISCHARGE PLAN: Home, when passes PT DISCHARGE NEEDS: HHPT, HHRN, CPM, Walker and 3-in-1 comode  seat     Laveda Demedeiros J 03/14/2014, 7:59 AM

## 2014-03-14 NOTE — Care Management Note (Signed)
CARE MANAGEMENT NOTE 03/14/2014  Patient:  CORINN, STOLTZFUS   Account Number:  [id number]  Date Initiated:  03/14/2014  Documentation initiated by:  Ricki Miller  Subjective/Objective Assessment:   80 yr old female admitted with left knee arthritis. Patient had a left total knee arthroplasty.     Action/Plan:   Case manager spoke with patient and her husband concerning home health and DME needs. Choice offered. Referral called to Amy, [place] Liaison. Patient has 3in1.   Anticipated DC Date:  03/15/2014   Anticipated DC Plan:  [place]  CM consult      [facility] [place] Choice  HOME HEALTH  DURABLE MEDICAL EQUIPMENT   Choice offered to / List presented to:  C-1 Patient   DME arranged  [place]      DME agency  [employer]     [place] arranged  HH-2 PT      [place].   Status of service:  Completed, signed off Medicare Important Message given?  NA - LOS <3 / Initial given by admissions (If response is "NO", the following Medicare IM given date fields will be blank) Date Medicare IM given:   Medicare IM given by:   Date Additional Medicare IM given:   Additional Medicare IM given by:    Discharge Disposition:  [place]  Per UR Regulation:  Reviewed for med. necessity/level of care/duration of stay  If discussed at [place] of Stay Meetings, dates discussed:

## 2014-03-14 NOTE — Progress Notes (Signed)
Utilization review completed.  

## 2014-03-14 NOTE — Evaluation (Signed)
Occupational Therapy Evaluation Patient Details Name: Darlene Gregory MRN: [id number] DOB: Jan 15, 1934 Today's Date: 03/14/2014    History of Present Illness s/p L TKA   Clinical Impression   Pt was functioning independently prior to admission.  She had a R TKA last year and is aware of what to expect.  Pt requires mod assist to stand from low surface of the recliner and min from the [facility].  Instructed pt in multiple uses of 3 in 1 and in use of AE for LB bathing and dressing.  Pt with plans to wait to shower until she is able to stand as it is very small, will practice with HHPT.  Pt will rely on her husband as she did with her last TKA for assist with LB bathing and dressing until she is able to resume on her own.  No further OT needs.     Follow Up Recommendations  No OT follow up    Equipment Recommendations  None recommended by OT    Recommendations for Other Services       Precautions / Restrictions Precautions Precautions: Fall Restrictions Weight Bearing Restrictions: Yes LLE Weight Bearing: Weight bearing as tolerated      Mobility Bed Mobility Pt up in chair.     Transfers Overall transfer level: Needs assistance Equipment used: Rolling walker (2 wheeled) Transfers: Sit to/from [facility] Sit to Stand: Mod assist;Min assist Stand pivot transfers: Min assist       General transfer comment: mod A initially for sit to stand from recliner and min A from [facility]. Cues for hand placement and technique. Encouraged WB through LLE as transferring    Balance Overall balance assessment: Needs assistance   Sitting balance-Leahy Scale: Good       Standing balance-Leahy Scale: Poor Standing balance comment: requires UE support to maintain balance                            ADL Overall ADL's : Needs assistance/impaired Eating/Feeding: Independent;Sitting   Grooming: Wash/dry hands;Wash/dry face;Brushing hair;Sitting;Set up   Upper Body  Bathing: Set up;Sitting   Lower Body Bathing: Moderate assistance;Sit to/from stand   Upper Body Dressing : Set up;Sitting   Lower Body Dressing: Moderate assistance;Sit to/from stand   Toilet Transfer: Minimal assistance;Cueing for sequencing;Stand-pivot;BSC;RW   Toileting- Clothing Manipulation and Hygiene: Supervision/safety;Sitting/lateral lean       Functional mobility during ADLs: Minimal assistance;Rolling walker       Vision                     Perception     Praxis      Pertinent Vitals/Pain Pain Assessment: Faces Pain Score: 5  Faces Pain Scale: Hurts little more Pain Location: L knee Pain Descriptors / Indicators: Sore Pain Intervention(s): Monitored during session;Limited activity within patient's tolerance;Repositioned;Premedicated before session     Hand Dominance Right   Extremity/Trunk Assessment Upper Extremity Assessment Upper Extremity Assessment: Overall WFL for tasks assessed   Lower Extremity Assessment Lower Extremity Assessment: Defer to PT evaluation LLE Deficits / Details: post op pain and weakness; decreased knee ROM and edema noted   Cervical / Trunk Assessment Cervical / Trunk Assessment: Normal   Communication Communication Communication: No difficulties   Cognition Arousal/Alertness: Awake/alert Behavior During Therapy: WFL for tasks assessed/performed Overall Cognitive Status: Within Functional Limits for tasks assessed  General Comments       Exercises Exercises: Total Joint     Shoulder Instructions      Home Living Family/patient expects to be discharged to:: Private residence Living Arrangements: Spouse/significant other Available Help at Discharge: Family;Available 24 hours/day Type of Home: House Home Access: Stairs to enter [employer] of Steps: 1 Entrance Stairs-Rails: None Home Layout: One level     Bathroom Shower/Tub: Occupational psychologist:  Standard Bathroom Accessibility: Yes How Accessible: Accessible via walker Home Equipment: Bedside commode          Prior Functioning/Environment Level of Independence: Independent             OT Diagnosis:     OT Problem List:     OT Treatment/Interventions:      OT Goals(Current goals can be found in the care plan section) Acute Rehab OT Goals Patient Stated Goal: go home  OT Frequency:     Barriers to D/C:            Co-evaluation              End of Session CPM Left Knee CPM Left Knee: Off  Activity Tolerance: Patient limited by pain;Patient limited by fatigue Patient left: in chair;with call bell/phone within reach;with family/visitor present   Time: 5003-7048 OT Time Calculation (min): 32 min Charges:  OT General Charges $OT Visit: 1 Procedure OT Evaluation $Initial OT Evaluation Tier I: 1 Procedure OT Treatments $Self Care/Home Management : 8-22 mins G-Codes:    Malka So 03/14/2014, 10:29 AM  [phone]

## 2014-03-14 NOTE — Addendum Note (Signed)
Addendum  created 03/14/14 0844 by Kate Sable, MD   Modules edited: Anesthesia Events, Narrator   Narrator:  Narrator: Event Log Edited

## 2014-03-14 NOTE — Progress Notes (Signed)
Physical Therapy Treatment Patient Details Name: Darlene Gregory MRN: [id number] DOB: [birth year] Today's Date: 03/14/2014    History of Present Illness s/p L TKA    PT Comments    Pt demonstrating improved gait this PM using RW. Pt's husband present to observe and both are hoping to d/c home tomorrow. Recommend to practice 1 step for home entry prior to d/c but would be ok from PT standpoint. Pt's husband is familiar with A from pt's h/o of R TKA.    Follow Up Recommendations  Home health PT;Supervision for mobility/OOB     Equipment Recommendations   (RW already ordered)    Recommendations for Other Services       Precautions / Restrictions Precautions Precautions: Fall Restrictions Weight Bearing Restrictions: Yes LLE Weight Bearing: Weight bearing as tolerated    Mobility  Bed Mobility               General bed mobility comments: up in recliner  Transfers Overall transfer level: Needs assistance Equipment used: Rolling walker (2 wheeled) Transfers: Sit to/from Stand Sit to Stand: Min assist;Mod assist Stand pivot transfers: Min assist       General transfer comment: mod A for sit to stand from low recliner with cues for hand placement and technique  Ambulation/Gait Ambulation/Gait assistance: Min guard;Min assist Ambulation Distance (Feet): 30 Feet Assistive device: Rolling walker (2 wheeled) Gait Pattern/deviations: Step-to pattern;Decreased stance time - left;Shuffle;Antalgic     General Gait Details: cues for posture and increasing step length   Stairs            Wheelchair Mobility    Modified Rankin (Stroke Patients Only)       Balance     Sitting balance-Leahy Scale: Good       Standing balance-Leahy Scale: Poor                      Cognition Arousal/Alertness: Awake/alert Behavior During Therapy: WFL for tasks assessed/performed Overall Cognitive Status: Within Functional Limits for tasks assessed                      Exercises Total Joint Exercises Quad Sets: Left;Strengthening;10 reps Long Arc Quad: AROM;Strengthening;Left;10 reps;Seated Knee Flexion: AAROM;Strengthening;Left;10 reps    General Comments        Pertinent Vitals/Pain Pain Assessment: Faces Pain Score: 4  Faces Pain Scale: Hurts little more Pain Location: L knee Pain Descriptors / Indicators: Aching Pain Intervention(s): Monitored during session;Repositioned    Home Living Family/patient expects to be discharged to:: Private residence Living Arrangements: Spouse/significant other Available Help at Discharge: Family;Available 24 hours/day Type of Home: House Home Access: Stairs to enter Entrance Stairs-Rails: None Home Layout: One level Home Equipment: Bedside commode      Prior Function Level of Independence: Independent          PT Goals (current goals can now be found in the care plan section) Acute Rehab PT Goals Patient Stated Goal: go home tomorrow PT Goal Formulation: With patient/family Time For Goal Achievement: 03/21/14 Potential to Achieve Goals: Good Progress towards PT goals: Progressing toward goals    Frequency  7X/week (BID)    PT Plan Current plan remains appropriate    Co-evaluation             End of Session Equipment Utilized During Treatment: Gait belt Activity Tolerance: Patient tolerated treatment well Patient left: in chair;with call bell/phone within reach;with family/visitor present     Time: 4098-1191  PT Time Calculation (min): 18 min  Charges:  $Therapeutic Exercise: 8-22 mins                    G Codes:      Allayne Gitelman 03/14/2014, 1:31 PM

## 2014-03-14 NOTE — Progress Notes (Signed)
Orthopedic Tech Progress Note Patient Details:  Darlene Gregory [birth year] [id number] On cpm at 8:20 pm increased 10 degrees 0-50 Patient ID: Darlene Gregory, female   DOB: Apr 29, 1934, 80 y.o.   MRN: [id number]   Braulio Bosch 03/14/2014, 8:23 PM

## 2014-03-15 LAB — CBC
HCT: 30.6 % — ABNORMAL LOW (ref 36.0–46.0)
Hemoglobin: 10.2 g/dL — ABNORMAL LOW (ref 12.0–15.0)
MCH: 32.1 pg (ref 26.0–34.0)
MCHC: 33.3 g/dL (ref 30.0–36.0)
MCV: 96.2 fL (ref 78.0–100.0)
Platelets: 141 10*3/uL — ABNORMAL LOW (ref 150–400)
RBC: 3.18 MIL/uL — ABNORMAL LOW (ref 3.87–5.11)
RDW: 13.3 % (ref 11.5–15.5)
WBC: 9 10*3/uL (ref 4.0–10.5)

## 2014-03-15 NOTE — Progress Notes (Signed)
PATIENT ID: Darlene Gregory  MRN: [id number]  DOB/AGE:  78-May-1935 / 80 y.o.  2 Days Post-Op Procedure(s) (LRB): LEFT TOTAL KNEE ARTHROPLASTY (Left)    PROGRESS NOTE Subjective: Patient is alert, oriented, mild Nausea, no Vomiting, yes passing gas, no Bowel Movement. Taking PO sips, pt not very [place] and does not like the food. Denies SOB, Chest or Calf Pain. Using Incentive Spirometer, PAS in place. Ambulate WBAT, CPM 0-50.   Patient reports pain as mild  .    Objective: Vital signs in last 24 hours: Filed Vitals:   03/14/14 2200 03/15/14 0000 03/15/14 0400 03/15/14 0527  BP: 123/51   140/53  Pulse: 96   101  Temp: 99.6 F (37.6 C)   99.9 F (37.7 C)  TempSrc:      Resp: 14 12 14 14   Height:      Weight:      SpO2: 94% 95% 96% 94%      Intake/Output from previous day: I/O last 3 completed shifts: In: 3623.8 [P.O.:830; I.V.:2118.8; Other:675] Out: 175 [Drains:175]   Intake/Output this shift:     LABORATORY DATA:  Recent Labs  03/14/14 0500 03/15/14 0608  WBC 9.6 9.0  HGB 11.3* 10.2*  HCT 33.5* 30.6*  PLT 164 141*  NA 139  --   K 4.5  --   CL 102  --   CO2 26  --   BUN 18  --   CREATININE 0.97  --   GLUCOSE 180*  --   CALCIUM 8.7  --     Examination: Neurologically intact Neurovascular intact Sensation intact distally Intact pulses distally Dorsiflexion/Plantar flexion intact Incision: dressing C/D/I No cellulitis present Compartment soft}  Assessment:   2 Days Post-Op Procedure(s) (LRB): LEFT TOTAL KNEE ARTHROPLASTY (Left) ADDITIONAL DIAGNOSIS: Expected Acute Blood Loss Anemia, Hypertension  Plan: PT/OT WBAT, CPM 5/hrs day until ROM 0-90 degrees, then D/C CPM DVT Prophylaxis:  SCDx72hrs, ASA 325 mg BID x 2 weeks DISCHARGE PLAN: Home, once pt passes therapy goals. DISCHARGE NEEDS: HHPT, HHRN, CPM, Walker and 3-in-1 comode seat     Sonoma Firkus R 03/15/2014, 8:05 AM

## 2014-03-15 NOTE — Discharge Summary (Signed)
Patient ID: Darlene Gregory MRN: [id number] DOB/AGE: 1933-06-04 80 y.o.  Admit date: 03/13/2014 Discharge date: 03/15/2014  Admission Diagnoses:  Active Problems:   Arthritis of knee   Discharge Diagnoses:  Same  Past Medical History  Diagnosis Date  . Hypertension   . DJD (degenerative joint disease)   . GERD (gastroesophageal reflux disease)   . Osteoporosis   . Thyroid disease   . Frequent urination   . Hypothyroidism   . Difficult intubation     "small trachea"; 01/18/05: glidescope but unable to pass stylet -->fast track LMA with blind passage for ETT, consider awake intubation; 08/14/08: IV induction and DL with glidesecope for ETT    Surgeries: Procedure(s): LEFT TOTAL KNEE ARTHROPLASTY on 03/13/2014   Consultants:    Discharged Condition: Improved  Hospital Course: Darlene Gregory is an 80 y.o. female who was admitted 03/13/2014 for operative treatment of<principal problem not specified>. Patient has severe unremitting pain that affects sleep, daily activities, and work/hobbies. After pre-op clearance the patient was taken to the operating room on 03/13/2014 and underwent  Procedure(s): LEFT TOTAL KNEE ARTHROPLASTY.    Patient was given perioperative antibiotics: Anti-infectives    Start     Dose/Rate Route Frequency Ordered Stop   03/13/14 1315  cefUROXime (ZINACEF) injection  Status:  Discontinued       As needed 03/13/14 1320 03/13/14 1415   03/13/14 0600  ceFAZolin (ANCEF) IVPB 2 g/50 mL premix     2 g100 mL/hr over 30 Minutes Intravenous On call to O.R. 03/12/14 1346 03/13/14 1229       Patient was given sequential compression devices, early ambulation, and chemoprophylaxis to prevent DVT.  Patient benefited maximally from hospital stay and there were no complications.    Recent vital signs: Patient Vitals for the past 24 hrs:  BP Temp Temp src Pulse Resp SpO2  03/15/14 0527 (!) 140/53 mmHg 99.9 F (37.7 C) - (!) 101 14 94 %  03/15/14 0400 - - - - 14 96 %   03/15/14 0000 - - - - 12 95 %  03/14/14 2200 (!) 123/51 mmHg 99.6 F (37.6 C) - 96 14 94 %  03/14/14 2000 - - - - 12 98 %  03/14/14 1443 (!) 135/48 mmHg 98.7 F (37.1 C) Oral 85 16 98 %  03/14/14 1040 (!) 141/53 mmHg 98.6 F (37 C) Oral 73 14 97 %     Recent laboratory studies:  Recent Labs  03/14/14 0500 03/15/14 0608  WBC 9.6 9.0  HGB 11.3* 10.2*  HCT 33.5* 30.6*  PLT 164 141*  NA 139  --   K 4.5  --   CL 102  --   CO2 26  --   BUN 18  --   CREATININE 0.97  --   GLUCOSE 180*  --   CALCIUM 8.7  --      Discharge Medications:     Medication List    TAKE these medications        ALPRAZolam 0.25 MG tablet  Commonly known as:  XANAX  Take 1 tablet (0.25 mg total) by mouth 2 (two) times daily as needed for anxiety.     aspirin EC 325 MG tablet  Take 1 tablet (325 mg total) by mouth 2 (two) times daily.     BENADRYL PO  Take 0.5 tablets by mouth at bedtime.     FIBER PO  Take 2 each by mouth daily.     fish oil-omega-3 fatty acids 1000  MG capsule  Take 1 g by mouth daily.     ICAPS PO  Take 1 tablet by mouth daily.     levothyroxine 50 MCG tablet  Commonly known as:  SYNTHROID, LEVOTHROID  Take 1 tablet (50 mcg total) by mouth daily.     losartan-hydrochlorothiazide 100-25 MG per tablet  Commonly known as:  HYZAAR  TAKE ONE TABLET BY MOUTH ONE TIME DAILY     meloxicam 7.5 MG tablet  Commonly known as:  MOBIC  Take 7.5 mg by mouth daily.     omeprazole 40 MG capsule  Commonly known as:  PRILOSEC  Take 40 mg by mouth daily.     oxyCODONE-acetaminophen 5-325 MG per tablet  Commonly known as:  ROXICET  Take 1 tablet by mouth every 4 (four) hours as needed.     tizanidine 2 MG capsule  Commonly known as:  ZANAFLEX  Take 1 capsule (2 mg total) by mouth 3 (three) times daily.     Vitamin D 2000 UNITS Caps  Take by mouth daily.        Diagnostic Studies: Dg Chest 2 View  03/08/2014   CLINICAL DATA:  Preop left knee replacement.  EXAM: CHEST   2 VIEW  COMPARISON:  08/09/2008  FINDINGS: Cardiomediastinal silhouette is within normal limits. Thoracic aortic calcification is again seen. Lungs are hyperinflated with increased AP diameter of the chest and increased thoracic kyphosis, unchanged. Lungs are clear. No pleural effusion or pneumothorax is seen. Multilevel bridging thoracic osteophytosis is noted.  IMPRESSION: No active cardiopulmonary disease.   Electronically Signed   By: Logan Bores   On: 03/08/2014 13:30    Disposition:       Discharge Instructions    CPM    Complete by:  As directed   Continuous passive motion machine (CPM):      Use the CPM from 0 to 60  for 5 hours per day.      You may increase by 10 degrees per day.  You may break it up into 2 or 3 sessions per day.      Use CPM for 2 weeks or until you are told to stop.     Call MD / Call 911    Complete by:  As directed   If you experience chest pain or shortness of breath, CALL 911 and be transported to the hospital emergency room.  If you develope a fever above 101 F, pus (white drainage) or increased drainage or redness at the wound, or calf pain, call your surgeon's office.     Change dressing    Complete by:  As directed   Change dressing on 5, then change the dressing daily with sterile 4 x 4 inch gauze dressing and apply TED hose.  You may clean the incision with alcohol prior to redressing.     Constipation Prevention    Complete by:  As directed   Drink plenty of fluids.  Prune juice may be helpful.  You may use a stool softener, such as Colace (over the counter) 100 mg twice a day.  Use MiraLax (over the counter) for constipation as needed.     Diet - low sodium heart healthy    Complete by:  As directed      Discharge instructions    Complete by:  As directed   Follow up in office with Dr. Mayer Camel in 2 weeks.     Driving restrictions    Complete by:  As directed  No driving for 2 weeks     Increase activity slowly as tolerated    Complete by:  As  directed      Patient may shower    Complete by:  As directed   You may shower without a dressing once there is no drainage.  Do not wash over the wound.  If drainage remains, cover wound with plastic wrap and then shower.           Follow-up Information    Follow up with Kerin Salen, MD In 2 weeks.   Specialty:  Orthopedic Surgery   Contact information:   [place] [address] [phone]       Follow up with [facility].   Why:  Someone from [facility] will contact you concerning start date and time for physical therapy.   Contact information:   [place] [address] [phone]        Signed: Hardin Negus, Trygve Thal R 03/15/2014, 8:09 AM

## 2014-03-15 NOTE — Plan of Care (Signed)
Problem: Phase III Progression Outcomes Goal: Incision clean - minimal/no drainage Outcome: Completed/Met Date Met:  03/15/14  Problem: Discharge Progression Outcomes Goal: Barriers To Progression Addressed/Resolved Outcome: Completed/Met Date Met:  03/15/14 Goal: CMS/Neurovascular status at or above baseline Outcome: Completed/Met Date Met:  03/15/14 Goal: Anticoagulant follow-up in place Outcome: Completed/Met Date Met:  03/15/14 Goal: Pain controlled with appropriate interventions Outcome: Completed/Met Date Met:  03/15/14 Goal: Hemodynamically stable Outcome: Completed/Met Date Met:  28/00/34 Goal: Complications resolved/controlled Outcome: Completed/Met Date Met:  03/15/14 Goal: Tolerates diet Outcome: Completed/Met Date Met:  03/15/14 Goal: Activity appropriate for discharge plan Outcome: Completed/Met Date Met:  03/15/14 Goal: Ambulates safely using assistive device Outcome: Completed/Met Date Met:  03/15/14 Goal: Follows weight - bearing limitations Outcome: Completed/Met Date Met:  03/15/14 Goal: Discharge plan in place and appropriate Outcome: Completed/Met Date Met:  03/15/14 Goal: Negotiates stairs Outcome: Completed/Met Date Met:  03/15/14 Goal: Demonstrates ADLs as appropriate Outcome: Completed/Met Date Met:  03/15/14 Goal: Incision without S/S infection Outcome: Completed/Met Date Met:  03/15/14 Goal: Other Discharge Outcomes/Goals Outcome: Not Applicable Date Met:  91/79/15

## 2014-03-15 NOTE — Plan of Care (Signed)
Problem: Phase III Progression Outcomes Goal: Pain controlled on oral analgesia Outcome: Completed/Met Date Met:  03/15/14 Goal: Ambulates Outcome: Completed/Met Date Met:  03/15/14 Goal: Discharge plan remains appropriate-arrangements made Outcome: Completed/Met Date Met:  03/15/14 Goal: Anticoagulant follow-up in place Outcome: Completed/Met Date Met:  03/15/14 Goal: Other Phase III Outcomes/Goals Outcome: Not Applicable Date Met:  35/78/97

## 2014-03-15 NOTE — Progress Notes (Signed)
Physical Therapy Treatment Patient Details Name: Darlene Gregory MRN: [id number] DOB: 02/11/1934 Today's Date: 03/15/2014    History of Present Illness s/p L TKA    PT Comments    Pt very motivated to D/C home today. Session addressed education on stair management technique and HEP. Pt safe from mobility standpoint to D/C home with husband at this time.   Follow Up Recommendations  Home health PT;Supervision for mobility/OOB     Equipment Recommendations  None recommended by PT    Recommendations for Other Services       Precautions / Restrictions Precautions Precautions: Knee Precaution Comments: reviewed no pillow/ice pack, etc under knee Restrictions Weight Bearing Restrictions: Yes LLE Weight Bearing: Weight bearing as tolerated    Mobility  Bed Mobility               General bed mobility comments: verbally reviewed technique; pt up in recliner  Transfers Overall transfer level: Needs assistance Equipment used: Rolling walker (2 wheeled) Transfers: Sit to/from Stand Sit to Stand: Supervision         General transfer comment: min cues for hand placement  Ambulation/Gait Ambulation/Gait assistance: Supervision Ambulation Distance (Feet): 100 Feet Assistive device: Rolling walker (2 wheeled) Gait Pattern/deviations: Step-to pattern;Decreased stance time - left;Decreased step length - right;Antalgic Gait velocity: decreased Gait velocity interpretation: Below normal speed for age/gender General Gait Details: cues for step through gt and to equalize step length; pt with very slow gt speed but no overt LOB noted; supervision for safety   Stairs Stairs: Yes Stairs assistance: Min guard Stair Management: No rails;Step to pattern;Backwards;With walker Number of Stairs: 1 General stair comments: cues for technique and min guard to steady RW  Wheelchair Mobility    Modified Rankin (Stroke Patients Only)       Balance Overall balance assessment: No  apparent balance deficits (not formally assessed)             Standing balance comment: pt standing period of time without UE support                    Cognition Arousal/Alertness: Awake/alert Behavior During Therapy: WFL for tasks assessed/performed Overall Cognitive Status: Within Functional Limits for tasks assessed                      Exercises Total Joint Exercises Ankle Circles/Pumps: AROM;Strengthening;Both;15 reps Quad Sets: Left;Strengthening;10 reps Hip ABduction/ADduction: AAROM;Strengthening;Left;10 reps;Seated Long Arc Quad: AROM;Strengthening;Left;10 reps;Seated Goniometric ROM: AROM in sitting -4 to 65 degrees; limited by ACE wrap    General Comments General comments (skin integrity, edema, etc.): reviewed car transfer technique and HEP       Pertinent Vitals/Pain Pain Assessment: 0-10 Pain Score: 5  Pain Location: Lt knee  Pain Descriptors / Indicators: Sore Pain Intervention(s): Monitored during session;Patient requesting pain meds-RN notified;Repositioned    Home Living                      Prior Function            PT Goals (current goals can now be found in the care plan section) Acute Rehab PT Goals Patient Stated Goal: home today PT Goal Formulation: With patient/family Time For Goal Achievement: 03/21/14 Potential to Achieve Goals: Good Progress towards PT goals: Progressing toward goals    Frequency  7X/week    PT Plan Current plan remains appropriate    Co-evaluation  End of Session Equipment Utilized During Treatment: Gait belt Activity Tolerance: Patient tolerated treatment well Patient left: in chair;with call bell/phone within reach     Time: 0800-0829 PT Time Calculation (min): 29 min  Charges:  $Gait Training: 8-22 mins $Therapeutic Exercise: 8-22 mins                    G Codes:      Darlene Gregory, [place] 03/15/2014, 9:19 AM

## 2014-03-18 ENCOUNTER — Inpatient Hospital Stay (HOSPITAL_COMMUNITY): Admission: RE | Admit: 2014-03-18 | Payer: Medicare Other | Source: Ambulatory Visit

## 2014-04-02 ENCOUNTER — Other Ambulatory Visit: Payer: Self-pay | Admitting: Nurse Practitioner

## 2014-04-09 ENCOUNTER — Ambulatory Visit: Payer: Medicare Other | Attending: Orthopedic Surgery | Admitting: Physical Therapy

## 2014-04-09 DIAGNOSIS — M25562 Pain in left knee: Secondary | ICD-10-CM | POA: Diagnosis present

## 2014-04-09 DIAGNOSIS — M25662 Stiffness of left knee, not elsewhere classified: Secondary | ICD-10-CM | POA: Insufficient documentation

## 2014-04-10 ENCOUNTER — Ambulatory Visit: Payer: Medicare Other | Admitting: Physical Therapy

## 2014-04-10 DIAGNOSIS — M25562 Pain in left knee: Secondary | ICD-10-CM | POA: Diagnosis not present

## 2014-04-12 ENCOUNTER — Ambulatory Visit: Payer: Medicare Other | Admitting: Physical Therapy

## 2014-04-15 ENCOUNTER — Ambulatory Visit: Payer: Medicare Other | Admitting: Physical Therapy

## 2014-04-15 DIAGNOSIS — M25562 Pain in left knee: Secondary | ICD-10-CM | POA: Diagnosis not present

## 2014-04-17 ENCOUNTER — Ambulatory Visit: Payer: Medicare Other | Admitting: Physical Therapy

## 2014-04-17 DIAGNOSIS — M25562 Pain in left knee: Secondary | ICD-10-CM | POA: Diagnosis not present

## 2014-04-18 ENCOUNTER — Encounter: Payer: Medicare Other | Admitting: Physical Therapy

## 2014-04-19 ENCOUNTER — Ambulatory Visit: Payer: Medicare Other | Admitting: Physical Therapy

## 2014-04-19 ENCOUNTER — Encounter: Payer: Self-pay | Admitting: *Deleted

## 2014-04-19 DIAGNOSIS — M25562 Pain in left knee: Secondary | ICD-10-CM | POA: Diagnosis not present

## 2014-04-22 ENCOUNTER — Ambulatory Visit: Payer: Medicare Other | Admitting: Physical Therapy

## 2014-04-22 ENCOUNTER — Telehealth: Payer: Self-pay | Admitting: Nurse Practitioner

## 2014-04-22 DIAGNOSIS — M25562 Pain in left knee: Secondary | ICD-10-CM | POA: Diagnosis not present

## 2014-04-22 NOTE — Telephone Encounter (Signed)
Spoke with patient and she wanted to know if she can come for her thyroid rck tomorrow and patient aware she can.

## 2014-04-23 ENCOUNTER — Other Ambulatory Visit (INDEPENDENT_AMBULATORY_CARE_PROVIDER_SITE_OTHER): Payer: Medicare Other

## 2014-04-23 ENCOUNTER — Ambulatory Visit: Payer: Medicare Other | Admitting: Physical Therapy

## 2014-04-23 DIAGNOSIS — R799 Abnormal finding of blood chemistry, unspecified: Secondary | ICD-10-CM

## 2014-04-23 DIAGNOSIS — M25562 Pain in left knee: Secondary | ICD-10-CM | POA: Diagnosis not present

## 2014-04-24 LAB — THYROID PANEL WITH TSH
Free Thyroxine Index: 2.4 (ref 1.2–4.9)
T3 Uptake Ratio: 31 % (ref 24–39)
T4 TOTAL: 7.7 ug/dL (ref 4.5–12.0)
TSH: 4.6 u[IU]/mL — AB (ref 0.450–4.500)

## 2014-04-25 ENCOUNTER — Telehealth: Payer: Self-pay

## 2014-04-25 NOTE — Telephone Encounter (Signed)
-----   Message from [facility] [place] sent at 04/24/2014  4:28 PM EST ----- TSH elevated- decrease levothyroxin to 1/2 daily instead of 1 and recheck in 6 months

## 2014-04-25 NOTE — Telephone Encounter (Signed)
She will cut her 50 mcg pills to make 25 mcg.

## 2014-04-25 NOTE — Telephone Encounter (Signed)
Letter sent with results

## 2014-04-26 ENCOUNTER — Ambulatory Visit: Payer: Medicare Other | Admitting: Physical Therapy

## 2014-04-26 DIAGNOSIS — M25562 Pain in left knee: Secondary | ICD-10-CM | POA: Diagnosis not present

## 2014-04-29 ENCOUNTER — Ambulatory Visit: Payer: Medicare Other | Admitting: Physical Therapy

## 2014-04-29 DIAGNOSIS — M25562 Pain in left knee: Secondary | ICD-10-CM | POA: Diagnosis not present

## 2014-05-01 ENCOUNTER — Ambulatory Visit: Payer: Medicare Other | Admitting: Physical Therapy

## 2014-05-01 DIAGNOSIS — M25562 Pain in left knee: Secondary | ICD-10-CM | POA: Diagnosis not present

## 2014-05-06 ENCOUNTER — Ambulatory Visit: Payer: Medicare Other | Admitting: Physical Therapy

## 2014-05-06 DIAGNOSIS — M25562 Pain in left knee: Secondary | ICD-10-CM | POA: Diagnosis not present

## 2014-05-09 ENCOUNTER — Ambulatory Visit: Payer: Medicare Other | Admitting: Physical Therapy

## 2014-05-09 DIAGNOSIS — M25562 Pain in left knee: Secondary | ICD-10-CM | POA: Diagnosis not present

## 2014-05-26 ENCOUNTER — Other Ambulatory Visit: Payer: Self-pay | Admitting: Nurse Practitioner

## 2014-05-27 ENCOUNTER — Encounter: Payer: Self-pay | Admitting: Nurse Practitioner

## 2014-05-27 ENCOUNTER — Ambulatory Visit (INDEPENDENT_AMBULATORY_CARE_PROVIDER_SITE_OTHER): Payer: 59 | Admitting: Nurse Practitioner

## 2014-05-27 VITALS — BP 132/70 | HR 85 | Temp 97.4°F | Ht 63.0 in | Wt 183.0 lb

## 2014-05-27 DIAGNOSIS — E038 Other specified hypothyroidism: Secondary | ICD-10-CM

## 2014-05-27 DIAGNOSIS — D509 Iron deficiency anemia, unspecified: Secondary | ICD-10-CM

## 2014-05-27 DIAGNOSIS — I10 Essential (primary) hypertension: Secondary | ICD-10-CM

## 2014-05-27 DIAGNOSIS — E034 Atrophy of thyroid (acquired): Secondary | ICD-10-CM

## 2014-05-27 DIAGNOSIS — K219 Gastro-esophageal reflux disease without esophagitis: Secondary | ICD-10-CM

## 2014-05-27 MED ORDER — OMEPRAZOLE 40 MG PO CPDR: 40.0000 mg | DELAYED_RELEASE_CAPSULE | Freq: Every day | ORAL | Status: DC

## 2014-05-27 MED ORDER — HYDROCODONE-HOMATROPINE 5-1.5 MG/5ML PO SYRP: 5.0000 mL | ORAL_SOLUTION | Freq: Three times a day (TID) | ORAL | Status: DC | PRN

## 2014-05-27 NOTE — Patient Instructions (Signed)

## 2014-05-27 NOTE — Addendum Note (Signed)
Addended by: Chevis Pretty on: 05/27/2014 11:56 AM   Modules accepted: Orders

## 2014-05-27 NOTE — Progress Notes (Signed)
  Subjective:    Patient ID: Darlene Gregory, female    DOB: 1933/07/17, 81 y.o.   MRN: [id number]  Patient is here for chronic disease follow up. No acute complaint today.   Hypertension This is a chronic problem. The current episode started more than 1 year ago. The problem is unchanged. The problem is controlled. Pertinent negatives include no chest pain, palpitations or shortness of breath. Risk factors for coronary artery disease include dyslipidemia, family history, obesity and post-menopausal state. Past treatments include diuretics and angiotensin blockers. The current treatment provides no improvement. Compliance problems include diet and exercise.   Monitors BP at least once a week, usually 150/78. Considering returning to [employer] class at [facility]. Hypothyroidism  This ia a chronic problem. Patient currently on Levothyroxine 25Mcg.  Anemia Paient on slow feed iron tablet .- No constipation.  GAD Patient states she has not had any episodes of anxiety recently but when she does she works through them. Has not had Xanax at all.     Review of Systems  Constitutional: Negative for fatigue.  Respiratory: Negative for shortness of breath.   Cardiovascular: Negative for chest pain and palpitations.  Gastrointestinal: Negative for constipation.  Psychiatric/Behavioral: Negative for sleep disturbance.  All other systems reviewed and are negative.     Objective:   Physical Exam  Constitutional: She is oriented to person, place, and time. She appears well-developed and well-nourished.  HENT:  Nose: Nose normal.  Mouth/Throat: Oropharynx is clear and moist.  Cerumen impaction bil   Eyes: EOM are normal.  Neck: Trachea normal, normal range of motion and full passive range of motion without pain. Neck supple. No JVD present. Carotid bruit is not present. No thyromegaly present.  Cardiovascular: Normal rate, regular rhythm and normal heart sounds.  Exam reveals no gallop and no  friction rub.   No murmur heard. Pulmonary/Chest: Effort normal and breath sounds normal.  Abdominal: Soft. Bowel sounds are normal. She exhibits no distension and no mass. There is no tenderness.  Musculoskeletal: Normal range of motion. She exhibits edema (1+ edema bil).  Lymphadenopathy:    She has no cervical adenopathy.  Neurological: She is alert and oriented to person, place, and time. She has normal reflexes.  Skin: Skin is warm and dry.  Psychiatric: She has a normal mood and affect. Her behavior is normal. Judgment and thought content normal.    BP 132/70 mmHg  Pulse 85  Temp(Src) 97.4 F (36.3 C) (Oral)  Ht $R'5\' 3"'Nh$  (1.6 m)  Wt 183 lb (83.008 kg)  BMI 32.43 kg/m2       Assessment & Plan:    1. Essential hypertension, benign Do not add salt to deit - CMP14+EGFR - NMR, lipoprofile  2. Gastroesophageal reflux disease without esophagitis Avoid spicy foods - omeprazole (PRILOSEC) 40 MG capsule; Take 1 capsule (40 mg total) by mouth daily.  Dispense: 90 capsule; Refill: 1  3. Hypothyroidism due to acquired atrophy of thyroid - Thyroid Panel With TSH  4. Iron deficiency anemia - Anemia Profile B    Labs pending Health maintenance reviewed Diet and exercise encouraged Continue all meds Follow up  In 3 months   [place], FNP

## 2014-05-28 LAB — CMP14+EGFR
ALT: 12 IU/L (ref 0–32)
AST: 13 IU/L (ref 0–40)
Albumin/Globulin Ratio: 1.7 (ref 1.1–2.5)
Albumin: 4.2 g/dL (ref 3.5–4.8)
Alkaline Phosphatase: 116 IU/L (ref 39–117)
BILIRUBIN TOTAL: 0.5 mg/dL (ref 0.0–1.2)
BUN/Creatinine Ratio: 16 (ref 11–26)
BUN: 19 mg/dL (ref 8–27)
CHLORIDE: 100 mmol/L (ref 97–108)
CO2: 27 mmol/L (ref 18–29)
Calcium: 9.7 mg/dL (ref 8.7–10.3)
Creatinine, Ser: 1.18 mg/dL — ABNORMAL HIGH (ref 0.57–1.00)
GFR calc Af Amer: 51 mL/min/{1.73_m2} — ABNORMAL LOW (ref >59)
GFR, EST NON AFRICAN AMERICAN: 44 mL/min/{1.73_m2} — AB (ref >59)
GLOBULIN, TOTAL: 2.5 g/dL (ref 1.5–4.5)
Glucose: 104 mg/dL — ABNORMAL HIGH (ref 65–99)
Potassium: 4.1 mmol/L (ref 3.5–5.2)
SODIUM: 141 mmol/L (ref 134–144)
Total Protein: 6.7 g/dL (ref 6.0–8.5)

## 2014-05-28 LAB — ANEMIA PROFILE B
BASOS: 1 %
Basophils Absolute: 0 10*3/uL (ref 0.0–0.2)
EOS ABS: 0.2 10*3/uL (ref 0.0–0.4)
EOS: 4 %
Ferritin: 59 ng/mL (ref 15–150)
Folate: >20 ng/mL (ref >3.0)
HCT: 40.5 % (ref 34.0–46.6)
HEMOGLOBIN: 13.1 g/dL (ref 11.1–15.9)
IMMATURE GRANS (ABS): 0 10*3/uL (ref 0.0–0.1)
IMMATURE GRANULOCYTES: 0 %
IRON SATURATION: 27 % (ref 15–55)
IRON: 88 ug/dL (ref 27–139)
LYMPHS ABS: 1.2 10*3/uL (ref 0.7–3.1)
LYMPHS: 22 %
MCH: 30.8 pg (ref 26.6–33.0)
MCHC: 32.3 g/dL (ref 31.5–35.7)
MCV: 95 fL (ref 79–97)
Monocytes Absolute: 0.4 10*3/uL (ref 0.1–0.9)
Monocytes: 8 %
NEUTROS ABS: 3.6 10*3/uL (ref 1.4–7.0)
Neutrophils Relative %: 65 %
PLATELETS: 211 10*3/uL (ref 150–379)
RBC: 4.25 x10E6/uL (ref 3.77–5.28)
RDW: 13.5 % (ref 12.3–15.4)
RETIC CT PCT: 0.9 % (ref 0.6–2.6)
TIBC: 326 ug/dL (ref 250–450)
UIBC: 238 ug/dL (ref 118–369)
Vitamin B-12: 725 pg/mL (ref 211–946)
WBC: 5.5 10*3/uL (ref 3.4–10.8)

## 2014-05-28 LAB — NMR, LIPOPROFILE
Cholesterol: 173 mg/dL (ref 100–199)
HDL CHOLESTEROL BY NMR: 71 mg/dL (ref >39)
HDL Particle Number: 31.8 umol/L (ref >=30.5)
LDL Particle Number: 783 nmol/L (ref <1000)
LDL Size: 21.4 nm (ref >20.5)
LDL-C: 89 mg/dL (ref 0–99)
LP-IR Score: <25 (ref <=45)
Small LDL Particle Number: <90 nmol/L (ref <=527)
Triglycerides by NMR: 67 mg/dL (ref 0–149)

## 2014-05-28 LAB — THYROID PANEL WITH TSH
Free Thyroxine Index: 2.4 (ref 1.2–4.9)
T3 Uptake Ratio: 29 % (ref 24–39)
T4, Total: 8.4 ug/dL (ref 4.5–12.0)
TSH: 6.19 u[IU]/mL — ABNORMAL HIGH (ref 0.450–4.500)

## 2014-05-30 ENCOUNTER — Other Ambulatory Visit: Payer: Self-pay | Admitting: Nurse Practitioner

## 2014-05-30 MED ORDER — LEVOTHYROXINE SODIUM 75 MCG PO TABS: 75.0000 ug | ORAL_TABLET | Freq: Every day | ORAL | Status: DC

## 2014-06-03 ENCOUNTER — Encounter: Payer: Self-pay | Admitting: Nurse Practitioner

## 2014-06-04 ENCOUNTER — Telehealth: Payer: Self-pay | Admitting: Nurse Practitioner

## 2014-06-04 NOTE — Telephone Encounter (Signed)
Labs have been discussed with patient.

## 2014-06-05 ENCOUNTER — Encounter: Payer: Self-pay | Admitting: *Deleted

## 2014-08-01 ENCOUNTER — Other Ambulatory Visit: Payer: Self-pay | Admitting: Gynecology

## 2014-08-02 LAB — CYTOLOGY - PAP

## 2014-08-30 ENCOUNTER — Other Ambulatory Visit: Payer: Self-pay | Admitting: Nurse Practitioner

## 2014-09-05 ENCOUNTER — Encounter: Payer: Self-pay | Admitting: Nurse Practitioner

## 2014-09-05 ENCOUNTER — Ambulatory Visit (INDEPENDENT_AMBULATORY_CARE_PROVIDER_SITE_OTHER): Payer: Medicare Other | Admitting: Nurse Practitioner

## 2014-09-05 VITALS — BP 144/84 | HR 55 | Temp 97.6°F | Ht 63.0 in | Wt 185.6 lb

## 2014-09-05 DIAGNOSIS — M7752 Other enthesopathy of left foot: Secondary | ICD-10-CM

## 2014-09-05 DIAGNOSIS — I10 Essential (primary) hypertension: Secondary | ICD-10-CM

## 2014-09-05 DIAGNOSIS — E034 Atrophy of thyroid (acquired): Secondary | ICD-10-CM | POA: Diagnosis not present

## 2014-09-05 DIAGNOSIS — K219 Gastro-esophageal reflux disease without esophagitis: Secondary | ICD-10-CM | POA: Diagnosis not present

## 2014-09-05 DIAGNOSIS — M6588 Other synovitis and tenosynovitis, other site: Secondary | ICD-10-CM

## 2014-09-05 DIAGNOSIS — E038 Other specified hypothyroidism: Secondary | ICD-10-CM

## 2014-09-05 MED ORDER — DICLOFENAC SODIUM 1 % TD GEL: 2.0000 g | Freq: Four times a day (QID) | TRANSDERMAL | Status: DC

## 2014-09-05 NOTE — Patient Instructions (Signed)
Achilles Tendinitis Achilles tendinitis is inflammation of the tough, cord-like band that attaches the lower muscles of your leg to your heel (Achilles tendon). It is usually caused by overusing the tendon and joint involved.  CAUSES Achilles tendinitis can happen because of:  A sudden increase in exercise or activity (such as running).  Doing the same exercises or activities (such as jumping) over and over.  Not warming up calf muscles before exercising.  Exercising in shoes that are worn out or not made for exercise.  Having arthritis or a bone growth on the back of the heel bone. This can rub against the tendon and hurt the tendon. SIGNS AND SYMPTOMS The most common symptoms are:  Pain in the back of the leg, just above the heel. The pain usually gets worse with exercise and better with rest.  Stiffness or soreness in the back of the leg, especially in the morning.  Swelling of the skin over the Achilles tendon.  Trouble standing on tiptoe. Sometimes, an Achilles tendon tears (ruptures). Symptoms of an Achilles tendon rupture can include:  Sudden, severe pain in the back of the leg.  Trouble putting weight on the foot or walking normally. DIAGNOSIS Achilles tendinitis will be diagnosed based on symptoms and a physical examination. An X-ray may be done to check if another condition is causing your symptoms. An MRI may be ordered if your health care provider suspects you may have completely torn your tendon, which is called an Achilles tendon rupture.  TREATMENT  Achilles tendinitis usually gets better over time. It can take weeks to months to heal completely. Treatment focuses on treating the symptoms and helping the injury heal. HOME CARE INSTRUCTIONS   Rest your Achilles tendon and avoid activities that cause pain.  Apply ice to the injured area:  Put ice in a plastic bag.  Place a towel between your skin and the bag.  Leave the ice on for 20 minutes, 2-3 times a  day  Try to avoid using the tendon (other than gentle range of motion) while the tendon is painful. Do not resume use until instructed by your health care provider. Then begin use gradually. Do not increase use to the point of pain. If pain does develop, decrease use and continue the above measures. Gradually increase activities that do not cause discomfort until you achieve normal use.  Do exercises to make your calf muscles stronger and more flexible. Your health care provider or physical therapist can recommend exercises for you to do.  Wrap your ankle with an elastic bandage or other wrap. This can help keep your tendon from moving too much. Your health care provider will show you how to wrap your ankle correctly.  Only take over-the-counter or prescription medicines for pain, discomfort, or fever as directed by your health care provider. SEEK MEDICAL CARE IF:   Your pain and swelling increase or pain is uncontrolled with medicines.  You develop new, unexplained symptoms or your symptoms get worse.  You are unable to move your toes or foot.  You develop warmth and swelling in your foot.  You have an unexplained temperature. MAKE SURE YOU:   Understand these instructions.  Will watch your condition.  Will get help right away if you are not doing well or get worse. Document Released: 02/03/2005 Document Revised: 02/14/2013 Document Reviewed: 12/06/2012 ExitCare Patient Information 2015 [employer]. This information is not intended to replace advice given to you by your health care provider. Make sure you discuss   any questions you have with your health care provider.  

## 2014-09-05 NOTE — Progress Notes (Signed)
  Subjective:    Patient ID: Darlene Gregory, female    DOB: [birth year], 81 y.o.   MRN: [id number]  Patient is here for chronic disease follow up. Complaint of tendonitis in left ankle- wearing a brace which helps- would like to try voltaren gel.  Hypertension This is a chronic problem. The current episode started more than 1 year ago. The problem is unchanged. The problem is controlled. Pertinent negatives include no chest pain, palpitations or shortness of breath. Risk factors for coronary artery disease include dyslipidemia, family history, obesity and post-menopausal state. Past treatments include diuretics and angiotensin blockers. The current treatment provides no improvement. Compliance problems include diet and exercise.   Monitors BP at least once a week, usually 150/78. Considering returning to [employer] class at [facility]. Hypothyroidism  This ia a chronic problem. Patient currently on Levothyroxine 25Mcg.  Anemia Paient on slow feed iron tablet .- No constipation.  GAD Patient states she has not had any episodes of anxiety recently but when she does she works through them. Has not had Xanax at all.     Review of Systems  Constitutional: Negative for fatigue.  HENT: Negative.   Respiratory: Negative.  Negative for shortness of breath.   Cardiovascular: Negative.  Negative for chest pain and palpitations.  Gastrointestinal: Negative for constipation.  Genitourinary: Negative.   Neurological: Negative.   Psychiatric/Behavioral: Negative.  Negative for sleep disturbance.  All other systems reviewed and are negative.     Objective:   Physical Exam  Constitutional: She is oriented to person, place, and time. She appears well-developed and well-nourished.  HENT:  Nose: Nose normal.  Mouth/Throat: Oropharynx is clear and moist.  Cerumen impaction bil   Eyes: EOM are normal.  Neck: Trachea normal, normal range of motion and full passive range of motion without pain. Neck supple.  No JVD present. Carotid bruit is not present. No thyromegaly present.  Cardiovascular: Normal rate, regular rhythm and normal heart sounds.  Exam reveals no gallop and no friction rub.   No murmur heard. Pulmonary/Chest: Effort normal and breath sounds normal.  Abdominal: Soft. Bowel sounds are normal. She exhibits no distension and no mass. There is no tenderness.  Musculoskeletal: Normal range of motion. She exhibits edema (1+ edema bil).  Lymphadenopathy:    She has no cervical adenopathy.  Neurological: She is alert and oriented to person, place, and time. She has normal reflexes.  Skin: Skin is warm and dry.  Psychiatric: She has a normal mood and affect. Her behavior is normal. Judgment and thought content normal.    BP 144/84 mmHg  Pulse 55  Temp(Src) 97.6 F (36.4 C) (Oral)  Ht _0  (1.6 m)  Wt 185 lb 9.6 oz (84.188 kg)  BMI 32.89 kg/m2        Assessment & Plan:    1. Essential hypertension, benign Do not add salt to diet - CMP14+EGFR - NMR, lipoprofile  2. Gastroesophageal reflux disease without esophagitis Avoid spicy foods Do not eat 2 hours prior to bedtime   3. Hypothyroidism due to acquired atrophy of thyroid - Thyroid Panel With TSH  4. Right ankle tendonitis - voltaren gel 2gm to ankle BID   Labs pending Health maintenance reviewed Diet and exercise encouraged Continue all meds Follow up  In 3 months   [place], FNP

## 2014-09-05 NOTE — Addendum Note (Signed)
Addended by: Earlene Plater on: 09/05/2014 11:02 AM   Modules accepted: Miquel Dunn

## 2014-09-06 LAB — CMP14+EGFR
ALK PHOS: 106 IU/L (ref 39–117)
ALT: 12 IU/L (ref 0–32)
AST: 17 IU/L (ref 0–40)
Albumin/Globulin Ratio: 1.8 (ref 1.1–2.5)
Albumin: 4.2 g/dL (ref 3.5–4.8)
BUN/Creatinine Ratio: 21 (ref 11–26)
BUN: 27 mg/dL (ref 8–27)
Bilirubin Total: 0.6 mg/dL (ref 0.0–1.2)
CHLORIDE: 99 mmol/L (ref 97–108)
CO2: 28 mmol/L (ref 18–29)
Calcium: 9.3 mg/dL (ref 8.7–10.3)
Creatinine, Ser: 1.3 mg/dL — ABNORMAL HIGH (ref 0.57–1.00)
GFR calc Af Amer: 45 mL/min/{1.73_m2} — ABNORMAL LOW (ref >59)
GFR, EST NON AFRICAN AMERICAN: 39 mL/min/{1.73_m2} — AB (ref >59)
GLUCOSE: 96 mg/dL (ref 65–99)
Globulin, Total: 2.4 g/dL (ref 1.5–4.5)
POTASSIUM: 4 mmol/L (ref 3.5–5.2)
Sodium: 140 mmol/L (ref 134–144)
TOTAL PROTEIN: 6.6 g/dL (ref 6.0–8.5)

## 2014-09-06 LAB — NMR, LIPOPROFILE
CHOLESTEROL: 164 mg/dL (ref 100–199)
HDL Cholesterol by NMR: 74 mg/dL (ref >39)
HDL PARTICLE NUMBER: 33.4 umol/L (ref >=30.5)
LDL PARTICLE NUMBER: 793 nmol/L (ref <1000)
LDL Size: 21.7 nm (ref >20.5)
LDL-C: 79 mg/dL (ref 0–99)
TRIGLYCERIDES BY NMR: 54 mg/dL (ref 0–149)

## 2014-09-06 LAB — THYROID PANEL WITH TSH
FREE THYROXINE INDEX: 2.8 (ref 1.2–4.9)
T3 Uptake Ratio: 28 % (ref 24–39)
T4, Total: 10.1 ug/dL (ref 4.5–12.0)
TSH: 1.84 u[IU]/mL (ref 0.450–4.500)

## 2014-09-08 ENCOUNTER — Other Ambulatory Visit: Payer: Self-pay | Admitting: Nurse Practitioner

## 2014-11-30 ENCOUNTER — Other Ambulatory Visit: Payer: Self-pay | Admitting: Nurse Practitioner

## 2014-11-30 ENCOUNTER — Other Ambulatory Visit: Payer: Self-pay | Admitting: Family Medicine

## 2014-12-10 ENCOUNTER — Encounter: Payer: Self-pay | Admitting: Nurse Practitioner

## 2014-12-10 ENCOUNTER — Ambulatory Visit (INDEPENDENT_AMBULATORY_CARE_PROVIDER_SITE_OTHER): Payer: Medicare Other | Admitting: Nurse Practitioner

## 2014-12-10 VITALS — BP 174/85 | HR 56 | Temp 97.1°F | Ht 63.0 in | Wt 186.0 lb

## 2014-12-10 DIAGNOSIS — E038 Other specified hypothyroidism: Secondary | ICD-10-CM | POA: Diagnosis not present

## 2014-12-10 DIAGNOSIS — M810 Age-related osteoporosis without current pathological fracture: Secondary | ICD-10-CM

## 2014-12-10 DIAGNOSIS — I10 Essential (primary) hypertension: Secondary | ICD-10-CM

## 2014-12-10 DIAGNOSIS — K649 Unspecified hemorrhoids: Secondary | ICD-10-CM | POA: Diagnosis not present

## 2014-12-10 DIAGNOSIS — K449 Diaphragmatic hernia without obstruction or gangrene: Secondary | ICD-10-CM | POA: Diagnosis not present

## 2014-12-10 DIAGNOSIS — K219 Gastro-esophageal reflux disease without esophagitis: Secondary | ICD-10-CM

## 2014-12-10 DIAGNOSIS — Z6832 Body mass index (BMI) 32.0-32.9, adult: Secondary | ICD-10-CM | POA: Diagnosis not present

## 2014-12-10 DIAGNOSIS — E034 Atrophy of thyroid (acquired): Secondary | ICD-10-CM | POA: Diagnosis not present

## 2014-12-10 DIAGNOSIS — D509 Iron deficiency anemia, unspecified: Secondary | ICD-10-CM

## 2014-12-10 MED ORDER — OMEPRAZOLE 40 MG PO CPDR: 40.0000 mg | DELAYED_RELEASE_CAPSULE | Freq: Every day | ORAL | Status: DC

## 2014-12-10 MED ORDER — AMLODIPINE BESYLATE 5 MG PO TABS: 5.0000 mg | ORAL_TABLET | Freq: Every day | ORAL | Status: DC

## 2014-12-10 NOTE — Progress Notes (Signed)
Subjective:    Patient ID: Darlene Gregory, female    DOB: 08-Feb-1934, 81 y.o.   MRN: [id number]  Patient is here for chronic disease follow up.   Hypertension This is a chronic problem. The current episode started more than 1 year ago. The problem is unchanged. The problem is controlled. Pertinent negatives include no chest pain, palpitations or shortness of breath. Risk factors for coronary artery disease include dyslipidemia, family history, obesity and post-menopausal state. Past treatments include diuretics and angiotensin blockers. The current treatment provides no improvement. Compliance problems include diet and exercise.   Monitors BP at least once a week, usually 150/78. Considering returning to [employer] class at [facility]. Hypothyroidism  This ia a chronic problem. Patient currently on Levothyroxine 25Mcg.  Anemia Paient on slow feed iron tablet .- No constipation.  GAD Patient states she has not had any episodes of anxiety recently but when she does she works through them. Has not had Xanax at all.     Review of Systems  Constitutional: Negative for fatigue.  HENT: Negative.   Respiratory: Negative.  Negative for shortness of breath.   Cardiovascular: Negative.  Negative for chest pain and palpitations.  Gastrointestinal: Negative for constipation.  Genitourinary: Negative.   Neurological: Negative.   Psychiatric/Behavioral: Negative.  Negative for sleep disturbance.  All other systems reviewed and are negative.     Objective:   Physical Exam  Constitutional: She is oriented to person, place, and time. She appears well-developed and well-nourished.  HENT:  Nose: Nose normal.  Mouth/Throat: Oropharynx is clear and moist.  Cerumen impaction bil   Eyes: EOM are normal.  Neck: Trachea normal, normal range of motion and full passive range of motion without pain. Neck supple. No JVD present. Carotid bruit is not present. No thyromegaly present.  Cardiovascular: Normal  rate, regular rhythm and normal heart sounds.  Exam reveals no gallop and no friction rub.   No murmur heard. Pulmonary/Chest: Effort normal and breath sounds normal.  Abdominal: Soft. Bowel sounds are normal. She exhibits no distension and no mass. There is no tenderness.  Musculoskeletal: Normal range of motion. She exhibits edema (1+ edema bil).  Lymphadenopathy:    She has no cervical adenopathy.  Neurological: She is alert and oriented to person, place, and time. She has normal reflexes.  Skin: Skin is warm and dry.  Psychiatric: She has a normal mood and affect. Her behavior is normal. Judgment and thought content normal.    BP 174/85 mmHg  Pulse 56  Temp(Src) 97.1 F (36.2 C) (Oral)  Ht $R'5\' 3"'oa$  (1.6 m)  Wt 186 lb (84.369 kg)  BMI 32.96 kg/m2      Assessment & Plan:   1. Hemorrhoids, unspecified hemorrhoid type Stool softners Increase fiber in diet  2. Essential hypertension, benign Do not add slat to diet - amLODipine (NORVASC) 5 MG tablet; Take 1 tablet (5 mg total) by mouth daily.  Dispense: 90 tablet; Refill: 1 - CMP14+EGFR - Lipid panel  3. Diaphragmatic hernia without obstruction and without gangrene  4. Gastroesophageal reflux disease without esophagitis Avoid spicy foods Do not eat 2 hours prior to bedtime - omeprazole (PRILOSEC) 40 MG capsule; Take 1 capsule (40 mg total) by mouth daily.  Dispense: 90 capsule; Refill: 1  5. Hypothyroidism due to acquired atrophy of thyroid  6. Osteoporosis Weight bearing exercises  7. Iron deficiency anemia - Anemia Profile B  8. BMI 32.0-32.9,adult Discussed diet and exercise for person with BMI >25 Will recheck weight  in 3-6 months     Labs pending Health maintenance reviewed Diet and exercise encouraged Continue all meds Follow up  In 3 months    [place], FNP

## 2014-12-10 NOTE — Patient Instructions (Signed)

## 2014-12-11 LAB — CMP14+EGFR
ALK PHOS: 107 IU/L (ref 39–117)
ALT: 10 IU/L (ref 0–32)
AST: 11 IU/L (ref 0–40)
Albumin/Globulin Ratio: 2 (ref 1.1–2.5)
Albumin: 4.3 g/dL (ref 3.5–4.8)
BUN / CREAT RATIO: 21 (ref 11–26)
BUN: 27 mg/dL (ref 8–27)
Bilirubin Total: 0.5 mg/dL (ref 0.0–1.2)
CALCIUM: 9.3 mg/dL (ref 8.7–10.3)
CHLORIDE: 97 mmol/L (ref 97–108)
CO2: 25 mmol/L (ref 18–29)
CREATININE: 1.3 mg/dL — AB (ref 0.57–1.00)
GFR calc Af Amer: 45 mL/min/{1.73_m2} — ABNORMAL LOW (ref >59)
GFR calc non Af Amer: 39 mL/min/{1.73_m2} — ABNORMAL LOW (ref >59)
GLOBULIN, TOTAL: 2.2 g/dL (ref 1.5–4.5)
GLUCOSE: 98 mg/dL (ref 65–99)
POTASSIUM: 4.2 mmol/L (ref 3.5–5.2)
Sodium: 138 mmol/L (ref 134–144)
TOTAL PROTEIN: 6.5 g/dL (ref 6.0–8.5)

## 2014-12-11 LAB — ANEMIA PROFILE B
BASOS: 1 %
Basophils Absolute: 0.1 10*3/uL (ref 0.0–0.2)
EOS (ABSOLUTE): 0.3 10*3/uL (ref 0.0–0.4)
EOS: 5 %
FERRITIN: 24 ng/mL (ref 15–150)
HEMATOCRIT: 37.1 % (ref 34.0–46.6)
Hemoglobin: 12.2 g/dL (ref 11.1–15.9)
IMMATURE GRANS (ABS): 0 10*3/uL (ref 0.0–0.1)
Immature Granulocytes: 0 %
Iron Saturation: 20 % (ref 15–55)
Iron: 75 ug/dL (ref 27–139)
LYMPHS: 25 %
Lymphocytes Absolute: 1.4 10*3/uL (ref 0.7–3.1)
MCH: 30.3 pg (ref 26.6–33.0)
MCHC: 32.9 g/dL (ref 31.5–35.7)
MCV: 92 fL (ref 79–97)
Monocytes Absolute: 0.6 10*3/uL (ref 0.1–0.9)
Monocytes: 11 %
NEUTROS PCT: 58 %
Neutrophils Absolute: 3.2 10*3/uL (ref 1.4–7.0)
Platelets: 169 10*3/uL (ref 150–379)
RBC: 4.03 x10E6/uL (ref 3.77–5.28)
RDW: 13.3 % (ref 12.3–15.4)
Retic Ct Pct: 0.8 % (ref 0.6–2.6)
Total Iron Binding Capacity: 368 ug/dL (ref 250–450)
UIBC: 293 ug/dL (ref 118–369)
VITAMIN B 12: 813 pg/mL (ref 211–946)
WBC: 5.4 10*3/uL (ref 3.4–10.8)

## 2014-12-11 LAB — LIPID PANEL
Chol/HDL Ratio: 2.2 ratio units (ref 0.0–4.4)
Cholesterol, Total: 159 mg/dL (ref 100–199)
HDL: 73 mg/dL (ref >39)
LDL CALC: 77 mg/dL (ref 0–99)
Triglycerides: 45 mg/dL (ref 0–149)
VLDL CHOLESTEROL CAL: 9 mg/dL (ref 5–40)

## 2014-12-13 ENCOUNTER — Telehealth: Payer: Self-pay | Admitting: Nurse Practitioner

## 2014-12-13 NOTE — Telephone Encounter (Signed)
Patient aware of results and copy of lab sent to patient.

## 2015-01-14 ENCOUNTER — Other Ambulatory Visit: Payer: Self-pay | Admitting: Nurse Practitioner

## 2015-02-27 ENCOUNTER — Encounter: Payer: Self-pay | Admitting: Nurse Practitioner

## 2015-03-07 ENCOUNTER — Other Ambulatory Visit: Payer: Self-pay | Admitting: Nurse Practitioner

## 2015-03-11 ENCOUNTER — Telehealth: Payer: Self-pay | Admitting: Nurse Practitioner

## 2015-03-11 NOTE — Telephone Encounter (Signed)
Stp and advised not to stop taking Norvasc until her appt with MMM 11/22 and discuss with MMM at that time. Pt voiced understanding.

## 2015-04-01 ENCOUNTER — Ambulatory Visit (INDEPENDENT_AMBULATORY_CARE_PROVIDER_SITE_OTHER): Payer: Medicare Other | Admitting: Nurse Practitioner

## 2015-04-01 ENCOUNTER — Ambulatory Visit: Payer: Medicare Other | Admitting: Nurse Practitioner

## 2015-04-01 ENCOUNTER — Encounter: Payer: Self-pay | Admitting: Nurse Practitioner

## 2015-04-01 VITALS — BP 163/71 | HR 54 | Temp 97.0°F | Ht 63.0 in | Wt 184.0 lb

## 2015-04-01 DIAGNOSIS — I10 Essential (primary) hypertension: Secondary | ICD-10-CM | POA: Diagnosis not present

## 2015-04-01 DIAGNOSIS — Z6832 Body mass index (BMI) 32.0-32.9, adult: Secondary | ICD-10-CM

## 2015-04-01 DIAGNOSIS — E034 Atrophy of thyroid (acquired): Secondary | ICD-10-CM | POA: Diagnosis not present

## 2015-04-01 DIAGNOSIS — F411 Generalized anxiety disorder: Secondary | ICD-10-CM | POA: Diagnosis not present

## 2015-04-01 DIAGNOSIS — D509 Iron deficiency anemia, unspecified: Secondary | ICD-10-CM

## 2015-04-01 DIAGNOSIS — E038 Other specified hypothyroidism: Secondary | ICD-10-CM

## 2015-04-01 DIAGNOSIS — K219 Gastro-esophageal reflux disease without esophagitis: Secondary | ICD-10-CM

## 2015-04-01 DIAGNOSIS — Z0000 Encounter for general adult medical examination without abnormal findings: Secondary | ICD-10-CM

## 2015-04-01 MED ORDER — CLONIDINE HCL 0.1 MG PO TABS: 0.1000 mg | ORAL_TABLET | Freq: Two times a day (BID) | ORAL | Status: DC

## 2015-04-01 NOTE — Addendum Note (Signed)
Addended by: Chevis Pretty on: 04/01/2015 09:42 AM   Modules accepted: Level of Service

## 2015-04-01 NOTE — Patient Instructions (Signed)
Fall Prevention in the Home  Falls can cause injuries and can affect people from all age groups. There are many simple things that you can do to make your home safe and to help prevent falls. WHAT CAN I DO ON THE OUTSIDE OF MY HOME?  Regularly repair the edges of walkways and driveways and fix any cracks.  Remove high doorway thresholds.  Trim any shrubbery on the main path into your home.  Use bright outdoor lighting.  Clear walkways of debris and clutter, including tools and rocks.  Regularly check that handrails are securely fastened and in good repair. Both sides of any steps should have handrails.  Install guardrails along the edges of any raised decks or porches.  Have leaves, snow, and ice cleared regularly.  Use sand or salt on walkways during winter months.  In the garage, clean up any spills right away, including grease or oil spills. WHAT CAN I DO IN THE BATHROOM?  Use night lights.  Install grab bars by the toilet and in the tub and shower. Do not use towel bars as grab bars.  Use non-skid mats or decals on the floor of the tub or shower.  If you need to sit down while you are in the shower, use a plastic, non-slip stool..  Keep the floor dry. Immediately clean up any water that spills on the floor.  Remove soap buildup in the tub or shower on a regular basis.  Attach bath mats securely with double-sided non-slip rug tape.  Remove throw rugs and other tripping hazards from the floor. WHAT CAN I DO IN THE BEDROOM?  Use night lights.  Make sure that a bedside light is easy to reach.  Do not use oversized bedding that drapes onto the floor.  Have a firm chair that has side arms to use for getting dressed.  Remove throw rugs and other tripping hazards from the floor. WHAT CAN I DO IN THE KITCHEN?   Clean up any spills right away.  Avoid walking on wet floors.  Place frequently used items in easy-to-reach places.  If you need to reach for something  above you, use a sturdy step stool that has a grab bar.  Keep electrical cables out of the way.  Do not use floor polish or wax that makes floors slippery. If you have to use wax, make sure that it is non-skid floor wax.  Remove throw rugs and other tripping hazards from the floor. WHAT CAN I DO IN THE STAIRWAYS?  Do not leave any items on the stairs.  Make sure that there are handrails on both sides of the stairs. Fix handrails that are broken or loose. Make sure that handrails are as long as the stairways.  Check any carpeting to make sure that it is firmly attached to the stairs. Fix any carpet that is loose or worn.  Avoid having throw rugs at the top or bottom of stairways, or secure the rugs with carpet tape to prevent them from moving.  Make sure that you have a light switch at the top of the stairs and the bottom of the stairs. If you do not have them, have them installed. WHAT ARE SOME OTHER FALL PREVENTION TIPS?  Wear closed-toe shoes that fit well and support your feet. Wear shoes that have rubber soles or low heels.  When you use a stepladder, make sure that it is completely opened and that the sides are firmly locked. Have someone hold the ladder while you   are using it. Do not climb a closed stepladder.  Add color or contrast paint or tape to grab bars and handrails in your home. Place contrasting color strips on the first and last steps.  Use mobility aids as needed, such as canes, walkers, scooters, and crutches.  Turn on lights if it is dark. Replace any light bulbs that burn out.  Set up furniture so that there are clear paths. Keep the furniture in the same spot.  Fix any uneven floor surfaces.  Choose a carpet design that does not hide the edge of steps of a stairway.  Be aware of any and all pets.  Review your medicines with your healthcare provider. Some medicines can cause dizziness or changes in blood pressure, which increase your risk of falling. Talk  with your health care provider about other ways that you can decrease your risk of falls. This may include working with a physical therapist or trainer to improve your strength, balance, and endurance.   This information is not intended to replace advice given to you by your health care provider. Make sure you discuss any questions you have with your health care provider.   Document Released: 04/16/2002 Document Revised: 09/10/2014 Document Reviewed: 05/31/2014 Elsevier Interactive Patient Education 2016 Elsevier Inc.  

## 2015-04-01 NOTE — Progress Notes (Addendum)
Subjective:    Patient ID: Darlene Gregory, female    DOB: 1933/10/09, 82 y.o.   MRN: [id number]  Patient is here for Complete physical exam- no PAP.  Hypertension This is a chronic problem. The current episode started more than 1 year ago. The problem is unchanged. The problem is controlled. Pertinent negatives include no chest pain, palpitations or shortness of breath. Risk factors for coronary artery disease include dyslipidemia, family history, obesity and post-menopausal state. Past treatments include diuretics and angiotensin blockers. The current treatment provides no improvement. Compliance problems include diet and exercise.   Monitors BP at least once a week, usually 150/78. Considering returning to [employer] class at [facility]. Hypothyroidism  This ia a chronic problem. Patient currently on Levothyroxine 25Mcg.  Anemia Paient on slow feed iron tablet .- No constipation.  GAD Patient states she has not had any episodes of anxiety recently but when she does she works through them. Has not had Xanax at all.  Hx iron deficiency anemia Currently not taking everything  Review of Systems  Constitutional: Negative for fatigue.  HENT: Negative.   Respiratory: Negative.  Negative for shortness of breath.   Cardiovascular: Negative.  Negative for chest pain and palpitations.  Gastrointestinal: Negative for constipation.  Genitourinary: Negative.   Neurological: Negative.   Psychiatric/Behavioral: Negative.  Negative for sleep disturbance.  All other systems reviewed and are negative.     Objective:   Physical Exam  Constitutional: She is oriented to person, place, and time. She appears well-developed and well-nourished.  HENT:  Nose: Nose normal.  Mouth/Throat: Oropharynx is clear and moist.  Cerumen impaction bil   Eyes: EOM are normal.  Neck: Trachea normal, normal range of motion and full passive range of motion without pain. Neck supple. No JVD present. Carotid bruit is not  present. No thyromegaly present.  Cardiovascular: Normal rate, regular rhythm and normal heart sounds.  Exam reveals no gallop and no friction rub.   No murmur heard. Pulmonary/Chest: Effort normal and breath sounds normal.  Abdominal: Soft. Bowel sounds are normal. She exhibits no distension and no mass. There is no tenderness.  Musculoskeletal: Normal range of motion. She exhibits edema (1+ edema bil).  Lymphadenopathy:    She has no cervical adenopathy.  Neurological: She is alert and oriented to person, place, and time. She has normal reflexes.  Skin: Skin is warm and dry.  Psychiatric: She has a normal mood and affect. Her behavior is normal. Judgment and thought content normal.   BP 163/71 mmHg  Pulse 54  Temp(Src) 97 F (36.1 C) (Oral)  Ht $R'5\' 3"'Hg$  (1.6 m)  Wt 184 lb (83.462 kg)  BMI 32.60 kg/m2        Assessment & Plan:   1. Essential hypertension, benign Do not add salt to diet - cloNIDine (CATAPRES) 0.1 MG tablet; Take 1 tablet (0.1 mg total) by mouth 2 (two) times daily.  Dispense: 60 tablet; Refill: 3 - CMP14+EGFR - Lipid panel  2. Gastroesophageal reflux disease without esophagitis Avoid spicy foods Do not eat 2 hours prior to bedtime   3. Hypothyroidism due to acquired atrophy of thyroid - Thyroid Panel With TSH  4. BMI 32.0-32.9,adult Discussed diet and exercise for person with BMI >25 Will recheck weight in 3-6 months   5. Iron deficiency anemia  6. GAD (generalized anxiety disorder) Stress management    Labs pending Health maintenance reviewed Diet and exercise encouraged Continue all meds Follow up  In 3 month   Mary-Margaret  Hassell Done, [place]

## 2015-04-02 ENCOUNTER — Telehealth: Payer: Self-pay | Admitting: Nurse Practitioner

## 2015-04-02 LAB — LIPID PANEL
CHOL/HDL RATIO: 2.2 ratio (ref 0.0–4.4)
Cholesterol, Total: 158 mg/dL (ref 100–199)
HDL: 71 mg/dL (ref >39)
LDL Calculated: 76 mg/dL (ref 0–99)
Triglycerides: 53 mg/dL (ref 0–149)
VLDL CHOLESTEROL CAL: 11 mg/dL (ref 5–40)

## 2015-04-02 LAB — ANEMIA PROFILE B
BASOS ABS: 0 10*3/uL (ref 0.0–0.2)
Basos: 1 %
EOS (ABSOLUTE): 0.3 10*3/uL (ref 0.0–0.4)
Eos: 4 %
Ferritin: 27 ng/mL (ref 15–150)
Hematocrit: 38.2 % (ref 34.0–46.6)
Hemoglobin: 12.9 g/dL (ref 11.1–15.9)
IMMATURE GRANULOCYTES: 0 %
IRON: 99 ug/dL (ref 27–139)
Immature Grans (Abs): 0 10*3/uL (ref 0.0–0.1)
Iron Saturation: 29 % (ref 15–55)
LYMPHS ABS: 1.6 10*3/uL (ref 0.7–3.1)
Lymphs: 26 %
MCH: 30.9 pg (ref 26.6–33.0)
MCHC: 33.8 g/dL (ref 31.5–35.7)
MCV: 92 fL (ref 79–97)
MONOS ABS: 0.6 10*3/uL (ref 0.1–0.9)
Monocytes: 10 %
NEUTROS ABS: 3.6 10*3/uL (ref 1.4–7.0)
NEUTROS PCT: 59 %
PLATELETS: 199 10*3/uL (ref 150–379)
RBC: 4.17 x10E6/uL (ref 3.77–5.28)
RDW: 14.2 % (ref 12.3–15.4)
Retic Ct Pct: 1 % (ref 0.6–2.6)
TIBC: 337 ug/dL (ref 250–450)
UIBC: 238 ug/dL (ref 118–369)
VITAMIN B 12: 705 pg/mL (ref 211–946)
WBC: 6.1 10*3/uL (ref 3.4–10.8)

## 2015-04-02 LAB — CMP14+EGFR
A/G RATIO: 1.7 (ref 1.1–2.5)
ALBUMIN: 4.3 g/dL (ref 3.5–4.8)
ALT: 13 IU/L (ref 0–32)
AST: 19 IU/L (ref 0–40)
Alkaline Phosphatase: 105 IU/L (ref 39–117)
BILIRUBIN TOTAL: 0.5 mg/dL (ref 0.0–1.2)
BUN / CREAT RATIO: 24 (ref 11–26)
BUN: 27 mg/dL (ref 8–27)
CHLORIDE: 101 mmol/L (ref 97–106)
CO2: 29 mmol/L (ref 18–29)
Calcium: 9.7 mg/dL (ref 8.7–10.3)
Creatinine, Ser: 1.11 mg/dL — ABNORMAL HIGH (ref 0.57–1.00)
GFR, EST AFRICAN AMERICAN: 55 mL/min/{1.73_m2} — AB (ref >59)
GFR, EST NON AFRICAN AMERICAN: 47 mL/min/{1.73_m2} — AB (ref >59)
Globulin, Total: 2.5 g/dL (ref 1.5–4.5)
Glucose: 97 mg/dL (ref 65–99)
POTASSIUM: 4.3 mmol/L (ref 3.5–5.2)
Sodium: 142 mmol/L (ref 136–144)
TOTAL PROTEIN: 6.8 g/dL (ref 6.0–8.5)

## 2015-04-02 LAB — THYROID PANEL WITH TSH
Free Thyroxine Index: 3.1 (ref 1.2–4.9)
T3 Uptake Ratio: 29 % (ref 24–39)
T4 TOTAL: 10.7 ug/dL (ref 4.5–12.0)
TSH: 3.33 u[IU]/mL (ref 0.450–4.500)

## 2015-04-02 NOTE — Telephone Encounter (Signed)
Patient had taken her 1st tablet of Catapres 0.1 mg last night and became very dizzy and weak afterwards, checked BP at 95/55.  Patient had not taken medication earlier in day, only last night.  What do you recommend?

## 2015-04-04 ENCOUNTER — Telehealth: Payer: Self-pay | Admitting: Nurse Practitioner

## 2015-04-04 NOTE — Telephone Encounter (Signed)
Gave patient info on taking 1/2 tablet twice a day.

## 2015-04-04 NOTE — Telephone Encounter (Signed)
What is blood pressure running at home?

## 2015-04-04 NOTE — Telephone Encounter (Signed)
Ok to take 1/2 tablet in morning and at night.

## 2015-04-09 NOTE — Telephone Encounter (Signed)
Copy of Last Labs Mailed to pt

## 2015-06-11 ENCOUNTER — Other Ambulatory Visit: Payer: Self-pay

## 2015-06-11 MED ORDER — LOSARTAN POTASSIUM-HCTZ 100-25 MG PO TABS: 1.0000 | ORAL_TABLET | Freq: Every day | ORAL | Status: DC

## 2015-06-11 MED ORDER — MELOXICAM 7.5 MG PO TABS: 7.5000 mg | ORAL_TABLET | Freq: Every day | ORAL | Status: DC

## 2015-06-18 ENCOUNTER — Telehealth: Payer: Self-pay | Admitting: Nurse Practitioner

## 2015-06-19 MED ORDER — OMEPRAZOLE 20 MG PO CPDR: 20.0000 mg | DELAYED_RELEASE_CAPSULE | Freq: Every day | ORAL | Status: DC

## 2015-06-19 NOTE — Telephone Encounter (Signed)
rx sent to pharmacy

## 2015-07-24 ENCOUNTER — Ambulatory Visit (INDEPENDENT_AMBULATORY_CARE_PROVIDER_SITE_OTHER): Payer: Medicare Other | Admitting: Nurse Practitioner

## 2015-07-24 ENCOUNTER — Encounter: Payer: Self-pay | Admitting: Nurse Practitioner

## 2015-07-24 VITALS — BP 144/86 | HR 56 | Temp 97.0°F | Ht 63.0 in | Wt 186.0 lb

## 2015-07-24 DIAGNOSIS — K219 Gastro-esophageal reflux disease without esophagitis: Secondary | ICD-10-CM

## 2015-07-24 DIAGNOSIS — E034 Atrophy of thyroid (acquired): Secondary | ICD-10-CM | POA: Diagnosis not present

## 2015-07-24 DIAGNOSIS — D509 Iron deficiency anemia, unspecified: Secondary | ICD-10-CM

## 2015-07-24 DIAGNOSIS — I10 Essential (primary) hypertension: Secondary | ICD-10-CM | POA: Diagnosis not present

## 2015-07-24 DIAGNOSIS — Z6832 Body mass index (BMI) 32.0-32.9, adult: Secondary | ICD-10-CM | POA: Diagnosis not present

## 2015-07-24 DIAGNOSIS — F411 Generalized anxiety disorder: Secondary | ICD-10-CM

## 2015-07-24 DIAGNOSIS — E038 Other specified hypothyroidism: Secondary | ICD-10-CM | POA: Diagnosis not present

## 2015-07-24 MED ORDER — OMEPRAZOLE 20 MG PO CPDR: 20.0000 mg | DELAYED_RELEASE_CAPSULE | Freq: Every day | ORAL | Status: DC

## 2015-07-24 MED ORDER — LEVOTHYROXINE SODIUM 75 MCG PO TABS: 75.0000 ug | ORAL_TABLET | Freq: Every day | ORAL | Status: DC

## 2015-07-24 MED ORDER — LOSARTAN POTASSIUM-HCTZ 100-25 MG PO TABS: 1.0000 | ORAL_TABLET | Freq: Every day | ORAL | Status: DC

## 2015-07-24 NOTE — Progress Notes (Signed)
Subjective:    Patient ID: Darlene Gregory, female    DOB: 1933/09/30, 82 y.o.   MRN: [id number]  Patient here today for follow up of chronic medical problems.  Outpatient Encounter Prescriptions as of 07/24/2015  Medication Sig  . Cholecalciferol (VITAMIN D) 2000 UNITS CAPS Take by mouth daily.  . cloNIDine (CATAPRES) 0.1 MG tablet Take 1 tablet (0.1 mg total) by mouth 2 (two) times daily.  Marland Kitchen FIBER PO Take 2 each by mouth daily.   . fish oil-omega-3 fatty acids 1000 MG capsule Take 1 g by mouth daily.  Marland Kitchen levothyroxine (SYNTHROID, LEVOTHROID) 75 MCG tablet TAKE ONE TABLET BY MOUTH ONE TIME DAILY  . loratadine (CLARITIN) 10 MG tablet TAKE ONE TABLET BY MOUTH ONE TIME DAILY  . losartan-hydrochlorothiazide (HYZAAR) 100-25 MG tablet Take 1 tablet by mouth daily.  . Melatonin 5 MG CAPS Take by mouth.  . meloxicam (MOBIC) 7.5 MG tablet Take 1 tablet (7.5 mg total) by mouth daily.  . Multiple Vitamins-Minerals (ICAPS PO) Take 1 tablet by mouth daily.   Marland Kitchen omeprazole (PRILOSEC) 20 MG capsule Take 1 capsule (20 mg total) by mouth daily.  . vitamin C (ASCORBIC ACID) 500 MG tablet Take 500 mg by mouth daily.   No facility-administered encounter medications on file as of 07/24/2015.    Hypertension This is a chronic problem. The current episode started more than 1 year ago. The problem is unchanged. The problem is controlled. Pertinent negatives include no chest pain, palpitations or shortness of breath. Risk factors for coronary artery disease include dyslipidemia, family history, obesity and post-menopausal state. Past treatments include diuretics and angiotensin blockers. The current treatment provides no improvement. Compliance problems include diet and exercise.   Monitors BP at least once a week, usually 150/78. Considering returning to [employer] class at [facility]. Hypothyroidism  This ia a chronic problem. Patient currently on Levothyroxine .  Anemia Paient on slow feed iron tablet .- No  constipation.  GAD Patient states she has not had any episodes of anxiety recently but when she does she works through them. Has not had Xanax at all.  Hx iron deficiency anemia Currently not taking everything  Review of Systems  Constitutional: Negative for fatigue.  HENT: Negative.   Respiratory: Negative.  Negative for shortness of breath.   Cardiovascular: Negative.  Negative for chest pain and palpitations.  Gastrointestinal: Negative for constipation.  Genitourinary: Negative.   Neurological: Negative.   Psychiatric/Behavioral: Negative.  Negative for sleep disturbance.  All other systems reviewed and are negative.     Objective:   Physical Exam  Constitutional: She is oriented to person, place, and time. She appears well-developed and well-nourished.  HENT:  Nose: Nose normal.  Mouth/Throat: Oropharynx is clear and moist.  Cerumen impaction bil   Eyes: EOM are normal.  Neck: Trachea normal, normal range of motion and full passive range of motion without pain. Neck supple. No JVD present. Carotid bruit is not present. No thyromegaly present.  Cardiovascular: Normal rate, regular rhythm and normal heart sounds.  Exam reveals no gallop and no friction rub.   No murmur heard. Pulmonary/Chest: Effort normal and breath sounds normal.  Abdominal: Soft. Bowel sounds are normal. She exhibits no distension and no mass. There is no tenderness.  Musculoskeletal: Normal range of motion. She exhibits edema (1+ edema bil).  Lymphadenopathy:    She has no cervical adenopathy.  Neurological: She is alert and oriented to person, place, and time. She has normal reflexes.  Skin: Skin  is warm and dry.  Psychiatric: She has a normal mood and affect. Her behavior is normal. Judgment and thought content normal.   BP 144/86 mmHg  Pulse 56  Temp(Src) 97 F (36.1 C) (Oral)  Ht '5\' 3"'$  (1.6 m)  Wt 186 lb (84.369 kg)  BMI 32.96 kg/m2      Assessment & Plan:   1. Essential hypertension,  benign Do not add salt to diet - losartan-hydrochlorothiazide (HYZAAR) 100-25 MG tablet; Take 1 tablet by mouth daily.  Dispense: 90 tablet; Refill: 1 - CMP14+EGFR - Lipid panel  2. Gastroesophageal reflux disease without esophagitis Avoid spicy foods Do not eat 2 hours prior to bedtime - omeprazole (PRILOSEC) 20 MG capsule; Take 1 capsule (20 mg total) by mouth daily.  Dispense: 90 capsule; Refill: 1  3. Hypothyroidism due to acquired atrophy of thyroid - levothyroxine (SYNTHROID, LEVOTHROID) 75 MCG tablet; Take 1 tablet (75 mcg total) by mouth daily.  Dispense: 90 tablet; Refill: 1 - Thyroid Panel With TSH  4. Iron deficiency anemia  5. BMI 32.0-32.9,adult Discussed diet and exercise for person with BMI >25 Will recheck weight in 3-6 months   6. GAD (generalized anxiety disorder) Stress management    Labs pending Health maintenance reviewed Diet and exercise encouraged Continue all meds Follow up  In 3 months   [place], FNP

## 2015-07-24 NOTE — Patient Instructions (Signed)
Bone Health Bones protect organs, store calcium, and anchor muscles. Good health habits, such as eating nutritious foods and exercising regularly, are important for maintaining healthy bones. They can also help to prevent a condition that causes bones to lose density and become weak and brittle (osteoporosis). WHY IS BONE MASS IMPORTANT? Bone mass refers to the amount of bone tissue that you have. The higher your bone mass, the stronger your bones. An important step toward having healthy bones throughout life is to have strong and dense bones during childhood. A young adult who has a high bone mass is more likely to have a high bone mass later in life. Bone mass at its greatest it is called peak bone mass. A large decline in bone mass occurs in older adults. In women, it occurs about the time of menopause. During this time, it is important to practice good health habits, because if more bone is lost than what is replaced, the bones will become less healthy and more likely to break (fracture). If you find that you have a low bone mass, you may be able to prevent osteoporosis or further bone loss by changing your diet and lifestyle. HOW CAN I FIND OUT IF MY BONE MASS IS LOW? Bone mass can be measured with an X-ray test that is called a bone mineral density (BMD) test. This test is recommended for all women who are age 65 or older. It may also be recommended for men who are age 70 or older, or for people who are more likely to develop osteoporosis due to:  Having bones that break easily.  Having a long-term disease that weakens bones, such as kidney disease or rheumatoid arthritis.  Having menopause earlier than normal.  Taking medicine that weakens bones, such as steroids, thyroid hormones, or hormone treatment for breast cancer or prostate cancer.  Smoking.  Drinking three or more alcoholic drinks each day. WHAT ARE THE NUTRITIONAL RECOMMENDATIONS FOR HEALTHY BONES? To have healthy bones, you need  to get enough of the right minerals and vitamins. Most nutrition experts recommend getting these nutrients from the foods that you eat. Nutritional recommendations vary from person to person. Ask your health care provider what is healthy for you. Here are some general guidelines. Calcium Recommendations Calcium is the most important (essential) mineral for bone health. Most people can get enough calcium from their diet, but supplements may be recommended for people who are at risk for osteoporosis. Good sources of calcium include:  Dairy products, such as low-fat or nonfat milk, cheese, and yogurt.  Dark green leafy vegetables, such as bok choy and broccoli.  Calcium-fortified foods, such as orange juice, cereal, bread, soy beverages, and tofu products.  Nuts, such as almonds. Follow these recommended amounts for daily calcium intake:  Children, age 1-3: 700 mg.  Children, age 4-8: 1,000 mg.  Children, age 9-13: 1,300 mg.  Teens, age 14-18: 1,300 mg.  Adults, age 19-50: 1,000 mg.  Adults, age 51-70:  Men: 1,000 mg.  Women: 1,200 mg.  Adults, age 71 or older: 1,200 mg.  Pregnant and breastfeeding females:  Teens: 1,300 mg.  Adults: 1,000 mg. Vitamin D Recommendations Vitamin D is the most essential vitamin for bone health. It helps the body to absorb calcium. Sunlight stimulates the skin to make vitamin D, so be sure to get enough sunlight. If you live in a cold climate or you do not get outside often, your health care provider may recommend that you take vitamin D supplements. Good   sources of vitamin D in your diet include:  Egg yolks.  Saltwater fish.  Milk and cereal fortified with vitamin D. Follow these recommended amounts for daily vitamin D intake:  Children and teens, age 1-18: 600 international units.  Adults, age 50 or younger: 400-800 international units.  Adults, age 51 or older: 800-1,000 international units. Other Nutrients Other nutrients for bone  health include:  Phosphorus. This mineral is found in meat, poultry, dairy foods, nuts, and legumes. The recommended daily intake for adult men and adult women is 700 mg.  Magnesium. This mineral is found in seeds, nuts, dark green vegetables, and legumes. The recommended daily intake for adult men is 400-420 mg. For adult women, it is 310-320 mg.  Vitamin K. This vitamin is found in green leafy vegetables. The recommended daily intake is 120 mg for adult men and 90 mg for adult women. WHAT TYPE OF PHYSICAL ACTIVITY IS BEST FOR BUILDING AND MAINTAINING HEALTHY BONES? Weight-bearing and strength-building activities are important for building and maintaining peak bone mass. Weight-bearing activities cause muscles and bones to work against gravity. Strength-building activities increases muscle strength that supports bones. Weight-bearing and muscle-building activities include:  Walking and hiking.  Jogging and running.  Dancing.  Gym exercises.  Lifting weights.  Tennis and racquetball.  Climbing stairs.  Aerobics. Adults should get at least 30 minutes of moderate physical activity on most days. Children should get at least 60 minutes of moderate physical activity on most days. Ask your health care provide what type of exercise is best for you. WHERE CAN I FIND MORE INFORMATION? For more information, check out the following websites:  National Osteoporosis Foundation: http://nof.org/learn/basics  National Institutes of Health: http://www.niams.nih.gov/Health_Info/Bone/Bone_Health/bone_health_for_life.asp   This information is not intended to replace advice given to you by your health care provider. Make sure you discuss any questions you have with your health care provider.   Document Released: 07/17/2003 Document Revised: 09/10/2014 Document Reviewed: 05/01/2014 Elsevier Interactive Patient Education 2016 Elsevier Inc.  

## 2015-07-25 LAB — LIPID PANEL
CHOL/HDL RATIO: 2.4 ratio (ref 0.0–4.4)
Cholesterol, Total: 156 mg/dL (ref 100–199)
HDL: 65 mg/dL (ref >39)
LDL Calculated: 78 mg/dL (ref 0–99)
Triglycerides: 65 mg/dL (ref 0–149)
VLDL CHOLESTEROL CAL: 13 mg/dL (ref 5–40)

## 2015-07-25 LAB — CMP14+EGFR
ALBUMIN: 4.1 g/dL (ref 3.5–4.7)
ALT: 12 IU/L (ref 0–32)
AST: 15 IU/L (ref 0–40)
Albumin/Globulin Ratio: 1.7 (ref 1.2–2.2)
Alkaline Phosphatase: 100 IU/L (ref 39–117)
BUN / CREAT RATIO: 23 (ref 11–26)
BUN: 26 mg/dL (ref 8–27)
Bilirubin Total: 0.5 mg/dL (ref 0.0–1.2)
CO2: 28 mmol/L (ref 18–29)
Calcium: 9.6 mg/dL (ref 8.7–10.3)
Chloride: 99 mmol/L (ref 96–106)
Creatinine, Ser: 1.11 mg/dL — ABNORMAL HIGH (ref 0.57–1.00)
GFR, EST AFRICAN AMERICAN: 54 mL/min/{1.73_m2} — AB (ref >59)
GFR, EST NON AFRICAN AMERICAN: 47 mL/min/{1.73_m2} — AB (ref >59)
GLOBULIN, TOTAL: 2.4 g/dL (ref 1.5–4.5)
GLUCOSE: 95 mg/dL (ref 65–99)
POTASSIUM: 4.3 mmol/L (ref 3.5–5.2)
Sodium: 142 mmol/L (ref 134–144)
Total Protein: 6.5 g/dL (ref 6.0–8.5)

## 2015-07-25 LAB — SPECIMEN STATUS

## 2015-07-25 LAB — THYROID PANEL WITH TSH
FREE THYROXINE INDEX: 3.3 (ref 1.2–4.9)
T3 Uptake Ratio: 32 % (ref 24–39)
T4 TOTAL: 10.2 ug/dL (ref 4.5–12.0)
TSH: 3.78 u[IU]/mL (ref 0.450–4.500)

## 2015-07-30 ENCOUNTER — Telehealth: Payer: Self-pay | Admitting: Nurse Practitioner

## 2015-07-30 NOTE — Telephone Encounter (Signed)
Patient aware of results and copy mailed to patient.

## 2015-09-15 ENCOUNTER — Other Ambulatory Visit: Payer: Self-pay | Admitting: Nurse Practitioner

## 2015-10-20 ENCOUNTER — Encounter: Payer: Self-pay | Admitting: Family Medicine

## 2015-10-20 ENCOUNTER — Ambulatory Visit (INDEPENDENT_AMBULATORY_CARE_PROVIDER_SITE_OTHER): Payer: Medicare Other | Admitting: Family Medicine

## 2015-10-20 VITALS — BP 151/64 | HR 68 | Temp 97.2°F | Ht 63.0 in | Wt 190.0 lb

## 2015-10-20 DIAGNOSIS — H6122 Impacted cerumen, left ear: Secondary | ICD-10-CM | POA: Diagnosis not present

## 2015-10-20 NOTE — Progress Notes (Signed)
Seen today to have her ears washed out. No other complaints. She was having trouble hearing on the left but that resolved with ear lavage. Lavage was carried out by the nurse without difficulty. Good result. Patient tolerated well. Hearing restored.  O: TMs nml. Canals clear post lavage. A: cerumen impaction resolved.  Claretta Fraise, MD

## 2015-11-10 ENCOUNTER — Other Ambulatory Visit: Payer: Self-pay | Admitting: Nurse Practitioner

## 2015-11-27 ENCOUNTER — Ambulatory Visit (INDEPENDENT_AMBULATORY_CARE_PROVIDER_SITE_OTHER): Payer: Medicare Other | Admitting: Nurse Practitioner

## 2015-11-27 ENCOUNTER — Encounter: Payer: Self-pay | Admitting: Nurse Practitioner

## 2015-11-27 VITALS — BP 165/88 | HR 51 | Temp 97.0°F | Ht 63.0 in | Wt 185.0 lb

## 2015-11-27 DIAGNOSIS — D509 Iron deficiency anemia, unspecified: Secondary | ICD-10-CM | POA: Diagnosis not present

## 2015-11-27 DIAGNOSIS — I10 Essential (primary) hypertension: Secondary | ICD-10-CM | POA: Diagnosis not present

## 2015-11-27 DIAGNOSIS — Z6832 Body mass index (BMI) 32.0-32.9, adult: Secondary | ICD-10-CM | POA: Diagnosis not present

## 2015-11-27 DIAGNOSIS — F411 Generalized anxiety disorder: Secondary | ICD-10-CM | POA: Diagnosis not present

## 2015-11-27 DIAGNOSIS — E034 Atrophy of thyroid (acquired): Secondary | ICD-10-CM | POA: Diagnosis not present

## 2015-11-27 DIAGNOSIS — K219 Gastro-esophageal reflux disease without esophagitis: Secondary | ICD-10-CM

## 2015-11-27 DIAGNOSIS — E038 Other specified hypothyroidism: Secondary | ICD-10-CM | POA: Diagnosis not present

## 2015-11-27 DIAGNOSIS — R300 Dysuria: Secondary | ICD-10-CM

## 2015-11-27 LAB — URINALYSIS
BILIRUBIN UA: NEGATIVE
GLUCOSE, UA: NEGATIVE
KETONES UA: NEGATIVE
Nitrite, UA: NEGATIVE
PROTEIN UA: NEGATIVE
RBC, UA: NEGATIVE
Specific Gravity, UA: 1.015 (ref 1.005–1.030)
Urobilinogen, Ur: 0.2 mg/dL (ref 0.2–1.0)
pH, UA: 7.5 (ref 5.0–7.5)

## 2015-11-27 MED ORDER — HYDROCOD POLST-CPM POLST ER 10-8 MG/5ML PO SUER: 5.0000 mL | Freq: Two times a day (BID) | ORAL | Status: DC | PRN

## 2015-11-27 MED ORDER — CLONIDINE HCL 0.1 MG PO TABS: 0.1000 mg | ORAL_TABLET | Freq: Two times a day (BID) | ORAL | Status: DC

## 2015-11-27 MED ORDER — LOSARTAN POTASSIUM-HCTZ 100-25 MG PO TABS: 1.0000 | ORAL_TABLET | Freq: Every day | ORAL | Status: DC

## 2015-11-27 MED ORDER — OMEPRAZOLE 20 MG PO CPDR: 20.0000 mg | DELAYED_RELEASE_CAPSULE | Freq: Every day | ORAL | Status: DC

## 2015-11-27 MED ORDER — LEVOTHYROXINE SODIUM 75 MCG PO TABS: 75.0000 ug | ORAL_TABLET | Freq: Every day | ORAL | Status: DC

## 2015-11-27 NOTE — Progress Notes (Signed)
Subjective:    Patient ID: Darlene Gregory, female    DOB: 07-29-1933, 82 y.o.   MRN: [id number]  Patient here today for follow up of chronic medical problems. She is doing well with only complaint of urine being a bright yellow color. No dysuria, frequency or urgency.  Outpatient Encounter Prescriptions as of 11/27/2015  Medication Sig  . Cholecalciferol (VITAMIN D) 2000 UNITS CAPS Take by mouth daily.  . cloNIDine (CATAPRES) 0.1 MG tablet TAKE ONE TABLET BY MOUTH TWICE DAILY  . FIBER PO Take 2 each by mouth daily.   . fish oil-omega-3 fatty acids 1000 MG capsule Take 1 g by mouth daily.  Marland Kitchen levothyroxine (SYNTHROID, LEVOTHROID) 75 MCG tablet Take 1 tablet (75 mcg total) by mouth daily.  Marland Kitchen losartan-hydrochlorothiazide (HYZAAR) 100-25 MG tablet Take 1 tablet by mouth daily.  . meloxicam (MOBIC) 7.5 MG tablet TAKE ONE TABLET BY MOUTH ONCE DAILY  . Multiple Vitamins-Minerals (ICAPS PO) Take 1 tablet by mouth daily.   Marland Kitchen omeprazole (PRILOSEC) 20 MG capsule Take 1 capsule (20 mg total) by mouth daily.  . vitamin C (ASCORBIC ACID) 500 MG tablet Take 500 mg by mouth daily.             Hypertension This is a chronic problem. The current episode started more than 1 year ago. The problem is unchanged. The problem is uncontrolled. Pertinent negatives include no chest pain, palpitations or shortness of breath. Risk factors for coronary artery disease include dyslipidemia, family history, obesity and post-menopausal state. Past treatments include diuretics, angiotensin blockers and central alpha agonists (has only been taking 1/2 dose of clonidine in the mornings.). The current treatment provides no improvement. Compliance problems include diet and exercise.   Hypothyroidism  This ia a chronic problem. Patient currently on Levothyroxine 25Mcg.  Anemia Paient on slow feed iron tablet .- No constipation.  GAD Patient states she has not had any episodes of anxiety recently but when she does she works  through them. Has not had Xanax at all.  Hx iron deficiency anemia Currently not taking everything  Review of Systems  Constitutional: Negative for fatigue.  HENT: Negative.   Respiratory: Negative.  Negative for shortness of breath.   Cardiovascular: Negative.  Negative for chest pain and palpitations.  Gastrointestinal: Negative for constipation.  Genitourinary: Negative.   Neurological: Negative.   Psychiatric/Behavioral: Negative.  Negative for sleep disturbance.  All other systems reviewed and are negative.     Objective:   Physical Exam  Constitutional: She is oriented to person, place, and time. She appears well-developed and well-nourished.  HENT:  Nose: Nose normal.  Mouth/Throat: Oropharynx is clear and moist.  Cerumen impaction bil   Eyes: EOM are normal.  Neck: Trachea normal, normal range of motion and full passive range of motion without pain. Neck supple. No JVD present. Carotid bruit is not present. No thyromegaly present.  Cardiovascular: Normal rate, regular rhythm and normal heart sounds.  Exam reveals no gallop and no friction rub.   No murmur heard. Pulmonary/Chest: Effort normal and breath sounds normal.  Abdominal: Soft. Bowel sounds are normal. She exhibits no distension and no mass. There is no tenderness.  Musculoskeletal: Normal range of motion. She exhibits edema (1+ edema bil).  Lymphadenopathy:    She has no cervical adenopathy.  Neurological: She is alert and oriented to person, place, and time. She has normal reflexes.  Skin: Skin is warm and dry.  Psychiatric: She has a normal mood and affect. Her behavior is  normal. Judgment and thought content normal.   BP 165/88 mmHg  Pulse 51  Temp(Src) 97 F (36.1 C) (Oral)  Ht 5\' 3"  (1.6 m)  Wt 185 lb (83.915 kg)  BMI 32.78 kg/m2       Assessment & Plan:   1. Dysuria Urine clear - Urinalysis - Urine culture  2. Essential hypertension, benign Do not add salt to diet Go back on clonidine  BID Keep diary of bood sugars at home - losartan-hydrochlorothiazide (HYZAAR) 100-25 MG tablet; Take 1 tablet by mouth daily.  Dispense: 90 tablet; Refill: 1 - cloNIDine (CATAPRES) 0.1 MG tablet; Take 1 tablet (0.1 mg total) by mouth 2 (two) times daily.  Dispense: 180 tablet; Refill: 1  3. Gastroesophageal reflux disease without esophagitis Avoid spicy foods Do not eat 2 hours prior to bedtime - omeprazole (PRILOSEC) 20 MG capsule; Take 1 capsule (20 mg total) by mouth daily.  Dispense: 90 capsule; Refill: 1  4. Hypothyroidism due to acquired atrophy of thyroid - levothyroxine (SYNTHROID, LEVOTHROID) 75 MCG tablet; Take 1 tablet (75 mcg total) by mouth daily.  Dispense: 90 tablet; Refill: 1  5. Iron deficiency anemia  6. BMI 32.0-32.9,adult Discussed diet and exercise for person with BMI >25 Will recheck weight in 3-6 months  7. GAD (generalized anxiety disorder) Stress management    Labs pending Health maintenance reviewed Diet and exercise encouraged Continue all meds Follow up  In 6 months   [place], FNP

## 2015-11-28 LAB — CMP14+EGFR
A/G RATIO: 1.7 (ref 1.2–2.2)
ALK PHOS: 99 IU/L (ref 39–117)
ALT: 11 IU/L (ref 0–32)
AST: 14 IU/L (ref 0–40)
Albumin: 4.5 g/dL (ref 3.5–4.7)
BILIRUBIN TOTAL: 0.5 mg/dL (ref 0.0–1.2)
BUN/Creatinine Ratio: 18 (ref 12–28)
BUN: 28 mg/dL — AB (ref 8–27)
CHLORIDE: 101 mmol/L (ref 96–106)
CO2: 26 mmol/L (ref 18–29)
Calcium: 9.8 mg/dL (ref 8.7–10.3)
Creatinine, Ser: 1.52 mg/dL — ABNORMAL HIGH (ref 0.57–1.00)
GFR calc Af Amer: 37 mL/min/{1.73_m2} — ABNORMAL LOW (ref >59)
GFR calc non Af Amer: 32 mL/min/{1.73_m2} — ABNORMAL LOW (ref >59)
GLUCOSE: 99 mg/dL (ref 65–99)
Globulin, Total: 2.6 g/dL (ref 1.5–4.5)
POTASSIUM: 4.4 mmol/L (ref 3.5–5.2)
Sodium: 143 mmol/L (ref 134–144)
TOTAL PROTEIN: 7.1 g/dL (ref 6.0–8.5)

## 2015-11-28 LAB — LIPID PANEL
CHOLESTEROL TOTAL: 150 mg/dL (ref 100–199)
Chol/HDL Ratio: 2.1 ratio units (ref 0.0–4.4)
HDL: 71 mg/dL (ref >39)
LDL Calculated: 68 mg/dL (ref 0–99)
TRIGLYCERIDES: 55 mg/dL (ref 0–149)
VLDL CHOLESTEROL CAL: 11 mg/dL (ref 5–40)

## 2015-11-30 LAB — URINE CULTURE

## 2015-12-01 ENCOUNTER — Other Ambulatory Visit: Payer: Self-pay | Admitting: Nurse Practitioner

## 2015-12-01 ENCOUNTER — Ambulatory Visit: Payer: Medicare Other | Admitting: Nurse Practitioner

## 2015-12-01 MED ORDER — DOXYCYCLINE HYCLATE 100 MG PO TABS: 100.0000 mg | ORAL_TABLET | Freq: Two times a day (BID) | ORAL | 0 refills | Status: DC

## 2015-12-02 ENCOUNTER — Telehealth: Payer: Self-pay | Admitting: *Deleted

## 2015-12-02 ENCOUNTER — Telehealth: Payer: Self-pay | Admitting: Nurse Practitioner

## 2015-12-02 NOTE — Telephone Encounter (Signed)
Results are in basket

## 2015-12-02 NOTE — Telephone Encounter (Signed)
-----   Message from [facility], [place] sent at 12/02/2015  1:32 PM EDT ----- Kidney and liver function stable Creatine creeping upward Cholesterol looks good Patient given doxy for uti- good coverage

## 2015-12-02 NOTE — Telephone Encounter (Signed)
Pt notified of results Verbalizes understanding 

## 2015-12-20 ENCOUNTER — Other Ambulatory Visit: Payer: Self-pay | Admitting: Nurse Practitioner

## 2016-03-12 ENCOUNTER — Telehealth: Payer: Self-pay | Admitting: Nurse Practitioner

## 2016-03-14 ENCOUNTER — Other Ambulatory Visit: Payer: Self-pay | Admitting: Nurse Practitioner

## 2016-03-15 ENCOUNTER — Other Ambulatory Visit: Payer: Self-pay | Admitting: *Deleted

## 2016-03-15 DIAGNOSIS — K219 Gastro-esophageal reflux disease without esophagitis: Secondary | ICD-10-CM

## 2016-03-15 MED ORDER — OMEPRAZOLE 20 MG PO CPDR: 20.0000 mg | DELAYED_RELEASE_CAPSULE | Freq: Every day | ORAL | 1 refills | Status: DC

## 2016-03-26 ENCOUNTER — Telehealth: Payer: Self-pay | Admitting: Nurse Practitioner

## 2016-03-26 NOTE — Telephone Encounter (Signed)
Informed pt that she is currently up to date on pneumonia vaccines

## 2016-03-30 ENCOUNTER — Ambulatory Visit (INDEPENDENT_AMBULATORY_CARE_PROVIDER_SITE_OTHER): Payer: Medicare Other | Admitting: Nurse Practitioner

## 2016-03-30 ENCOUNTER — Encounter: Payer: Self-pay | Admitting: Nurse Practitioner

## 2016-03-30 VITALS — BP 136/80 | HR 52 | Temp 97.1°F | Ht 63.0 in | Wt 185.0 lb

## 2016-03-30 DIAGNOSIS — F411 Generalized anxiety disorder: Secondary | ICD-10-CM | POA: Diagnosis not present

## 2016-03-30 DIAGNOSIS — Z6832 Body mass index (BMI) 32.0-32.9, adult: Secondary | ICD-10-CM

## 2016-03-30 DIAGNOSIS — K219 Gastro-esophageal reflux disease without esophagitis: Secondary | ICD-10-CM | POA: Diagnosis not present

## 2016-03-30 DIAGNOSIS — D509 Iron deficiency anemia, unspecified: Secondary | ICD-10-CM | POA: Diagnosis not present

## 2016-03-30 DIAGNOSIS — K449 Diaphragmatic hernia without obstruction or gangrene: Secondary | ICD-10-CM

## 2016-03-30 DIAGNOSIS — E034 Atrophy of thyroid (acquired): Secondary | ICD-10-CM

## 2016-03-30 DIAGNOSIS — I10 Essential (primary) hypertension: Secondary | ICD-10-CM | POA: Diagnosis not present

## 2016-03-30 MED ORDER — CLONIDINE HCL 0.1 MG PO TABS: 0.1000 mg | ORAL_TABLET | Freq: Two times a day (BID) | ORAL | 1 refills | Status: DC

## 2016-03-30 MED ORDER — OMEPRAZOLE 20 MG PO CPDR: 20.0000 mg | DELAYED_RELEASE_CAPSULE | Freq: Every day | ORAL | 1 refills | Status: DC

## 2016-03-30 MED ORDER — LOSARTAN POTASSIUM-HCTZ 100-25 MG PO TABS: 1.0000 | ORAL_TABLET | Freq: Every day | ORAL | 1 refills | Status: DC

## 2016-03-30 MED ORDER — LEVOTHYROXINE SODIUM 75 MCG PO TABS: 75.0000 ug | ORAL_TABLET | Freq: Every day | ORAL | 1 refills | Status: DC

## 2016-03-30 MED ORDER — HYDROCODONE-HOMATROPINE 5-1.5 MG/5ML PO SYRP: 5.0000 mL | ORAL_SOLUTION | Freq: Four times a day (QID) | ORAL | 0 refills | Status: DC | PRN

## 2016-03-30 NOTE — Patient Instructions (Signed)
Fall Prevention in the Home Introduction Falls can cause injuries. They can happen to people of all ages. There are many things you can do to make your home safe and to help prevent falls. What can I do on the outside of my home?  Regularly fix the edges of walkways and driveways and fix any cracks.  Remove anything that might make you trip as you walk through a door, such as a raised step or threshold.  Trim any bushes or trees on the path to your home.  Use bright outdoor lighting.  Clear any walking paths of anything that might make someone trip, such as rocks or tools.  Regularly check to see if handrails are loose or broken. Make sure that both sides of any steps have handrails.  Any raised decks and porches should have guardrails on the edges.  Have any leaves, snow, or ice cleared regularly.  Use sand or salt on walking paths during winter.  Clean up any spills in your garage right away. This includes oil or grease spills. What can I do in the bathroom?  Use night lights.  Install grab bars by the toilet and in the tub and shower. Do not use towel bars as grab bars.  Use non-skid mats or decals in the tub or shower.  If you need to sit down in the shower, use a plastic, non-slip stool.  Keep the floor dry. Clean up any water that spills on the floor as soon as it happens.  Remove soap buildup in the tub or shower regularly.  Attach bath mats securely with double-sided non-slip rug tape.  Do not have throw rugs and other things on the floor that can make you trip. What can I do in the bedroom?  Use night lights.  Make sure that you have a light by your bed that is easy to reach.  Do not use any sheets or blankets that are too big for your bed. They should not hang down onto the floor.  Have a firm chair that has side arms. You can use this for support while you get dressed.  Do not have throw rugs and other things on the floor that can make you trip. What can  I do in the kitchen?  Clean up any spills right away.  Avoid walking on wet floors.  Keep items that you use a lot in easy-to-reach places.  If you need to reach something above you, use a strong step stool that has a grab bar.  Keep electrical cords out of the way.  Do not use floor polish or wax that makes floors slippery. If you must use wax, use non-skid floor wax.  Do not have throw rugs and other things on the floor that can make you trip. What can I do with my stairs?  Do not leave any items on the stairs.  Make sure that there are handrails on both sides of the stairs and use them. Fix handrails that are broken or loose. Make sure that handrails are as long as the stairways.  Check any carpeting to make sure that it is firmly attached to the stairs. Fix any carpet that is loose or worn.  Avoid having throw rugs at the top or bottom of the stairs. If you do have throw rugs, attach them to the floor with carpet tape.  Make sure that you have a light switch at the top of the stairs and the bottom of the stairs. If you   do not have them, ask someone to add them for you. What else can I do to help prevent falls?  Wear shoes that:  Do not have high heels.  Have rubber bottoms.  Are comfortable and fit you well.  Are closed at the toe. Do not wear sandals.  If you use a stepladder:  Make sure that it is fully opened. Do not climb a closed stepladder.  Make sure that both sides of the stepladder are locked into place.  Ask someone to hold it for you, if possible.  Clearly mark and make sure that you can see:  Any grab bars or handrails.  First and last steps.  Where the edge of each step is.  Use tools that help you move around (mobility aids) if they are needed. These include:  Canes.  Walkers.  Scooters.  Crutches.  Turn on the lights when you go into a dark area. Replace any light bulbs as soon as they burn out.  Set up your furniture so you have a  clear path. Avoid moving your furniture around.  If any of your floors are uneven, fix them.  If there are any pets around you, be aware of where they are.  Review your medicines with your doctor. Some medicines can make you feel dizzy. This can increase your chance of falling. Ask your doctor what other things that you can do to help prevent falls. This information is not intended to replace advice given to you by your health care provider. Make sure you discuss any questions you have with your health care provider. Document Released: 02/20/2009 Document Revised: 10/02/2015 Document Reviewed: 05/31/2014  2017 Elsevier  

## 2016-03-30 NOTE — Progress Notes (Signed)
Subjective:    Patient ID: Darlene Gregory, female    DOB: 09/01/1933, 82 y.o.   MRN: [id number]  Patient here today for follow up of chronic medical problems. No changes since last visit. No complaints today.  Outpatient Encounter Prescriptions as of 03/30/2016  Medication Sig  . chlorpheniramine-HYDROcodone (TUSSIONEX) 10-8 MG/5ML SUER Take 5 mLs by mouth every 12 (twelve) hours as needed for cough.  . Cholecalciferol (VITAMIN D) 2000 UNITS CAPS Take by mouth daily.  . cloNIDine (CATAPRES) 0.1 MG tablet Take 1 tablet (0.1 mg total) by mouth 2 (two) times daily.  Marland Kitchen doxycycline (VIBRA-TABS) 100 MG tablet Take 1 tablet (100 mg total) by mouth 2 (two) times daily. 1 po bid  . FIBER PO Take 2 each by mouth daily.   . fish oil-omega-3 fatty acids 1000 MG capsule Take 1 g by mouth daily.  Marland Kitchen levothyroxine (SYNTHROID, LEVOTHROID) 75 MCG tablet Take 1 tablet (75 mcg total) by mouth daily.  Marland Kitchen losartan-hydrochlorothiazide (HYZAAR) 100-25 MG tablet Take 1 tablet by mouth daily.  . meloxicam (MOBIC) 7.5 MG tablet TAKE ONE TABLET BY MOUTH ONCE DAILY  . Multiple Vitamins-Minerals (ICAPS PO) Take 1 tablet by mouth daily.   Marland Kitchen omeprazole (PRILOSEC) 20 MG capsule Take 1 capsule (20 mg total) by mouth daily.  . vitamin C (ASCORBIC ACID) 500 MG tablet Take 500 mg by mouth daily.   No facility-administered encounter medications on file as of 03/30/2016.     Hypertension  This is a chronic problem. The current episode started more than 1 year ago. The problem is unchanged. The problem is controlled. Pertinent negatives include no chest pain, palpitations or shortness of breath. Risk factors for coronary artery disease include dyslipidemia, family history, obesity and post-menopausal state. Past treatments include diuretics and angiotensin blockers. The current treatment provides no improvement. Compliance problems include diet and exercise.   Monitors BP at least once a week, usually 150/78. Considering returning  to [employer] class at [facility]. Hypothyroidism  This ia a chronic problem. Patient currently on Levothyroxine 25Mcg.  Anemia Paient on slow feed iron tablet .- No constipation.  GAD Patient states she has not had any episodes of anxiety recently but when she does she works through them. Has not had Xanax at all.  Hx iron deficiency anemia Currently not taking everything GERD/hiatla hernia Currently omeprazole '20mg'$  daily- works well to keep symptoms under conntrol. If sh eeats to much she says her hiatal hernia acts up and she has trouble sleeping.    Review of Systems  Constitutional: Negative for fatigue.  HENT: Negative.   Respiratory: Negative.  Negative for shortness of breath.   Cardiovascular: Negative.  Negative for chest pain and palpitations.  Gastrointestinal: Negative for constipation.  Genitourinary: Negative.   Neurological: Negative.   Psychiatric/Behavioral: Negative.  Negative for sleep disturbance.  All other systems reviewed and are negative.     Objective:   Physical Exam  Constitutional: She is oriented to person, place, and time. She appears well-developed and well-nourished.  HENT:  Nose: Nose normal.  Mouth/Throat: Oropharynx is clear and moist.  Cerumen impaction bil   Eyes: EOM are normal.  Neck: Trachea normal, normal range of motion and full passive range of motion without pain. Neck supple. No JVD present. Carotid bruit is not present. No thyromegaly present.  Cardiovascular: Normal rate, regular rhythm and normal heart sounds.  Exam reveals no gallop and no friction rub.   No murmur heard. Pulmonary/Chest: Effort normal and breath sounds normal.  Abdominal: Soft. Bowel sounds are normal. She exhibits no distension and no mass. There is no tenderness.  Musculoskeletal: Normal range of motion. She exhibits edema (1+ edema bil).  Lymphadenopathy:    She has no cervical adenopathy.  Neurological: She is alert and oriented to person, place, and  time. She has normal reflexes.  Skin: Skin is warm and dry.  Psychiatric: She has a normal mood and affect. Her behavior is normal. Judgment and thought content normal.    BP 136/80 (BP Location: Left Arm, Cuff Size: Large)   Pulse (!) 52   Temp 97.1 F (36.2 C) (Oral)   Ht '5\' 3"'$  (1.6 m)   Wt 185 lb (83.9 kg)   SpO2 100%   BMI 32.77 kg/m        Assessment & Plan:  1. Essential hypertension, benign Low sodium diet - losartan-hydrochlorothiazide (HYZAAR) 100-25 MG tablet; Take 1 tablet by mouth daily.  Dispense: 90 tablet; Refill: 1 - cloNIDine (CATAPRES) 0.1 MG tablet; Take 1 tablet (0.1 mg total) by mouth 2 (two) times daily.  Dispense: 180 tablet; Refill: 1 - CMP14+EGFR - Lipid panel  2. Gastroesophageal reflux disease without esophagitis Avoid spicy foods Do not eat 2 hours prior to bedtime - omeprazole (PRILOSEC) 20 MG capsule; Take 1 capsule (20 mg total) by mouth daily.  Dispense: 90 capsule; Refill: 1  3. Hypothyroidism due to acquired atrophy of thyroid - levothyroxine (SYNTHROID, LEVOTHROID) 75 MCG tablet; Take 1 tablet (75 mcg total) by mouth daily.  Dispense: 90 tablet; Refill: 1 - Thyroid Panel With TSH  4. BMI 32.0-32.9,adult Discussed diet and exercise for person with BMI >25 Will recheck weight in 3-6 months  5. GAD (generalized anxiety disorder) Stress management  6. Iron deficiency anemia, unspecified iron deficiency anemia type - CBC with Differential/Platelet  7. Diaphragmatic hernia without obstruction and without gangrene Avoid over eating    Labs pending Health maintenance reviewed Diet and exercise encouraged Continue all meds Follow up  In 6 months   [place], FNP

## 2016-03-31 LAB — CMP14+EGFR
A/G RATIO: 1.5 (ref 1.2–2.2)
ALBUMIN: 4 g/dL (ref 3.5–4.7)
ALK PHOS: 105 IU/L (ref 39–117)
ALT: 11 IU/L (ref 0–32)
AST: 15 IU/L (ref 0–40)
BILIRUBIN TOTAL: 0.5 mg/dL (ref 0.0–1.2)
BUN / CREAT RATIO: 17 (ref 12–28)
BUN: 30 mg/dL — AB (ref 8–27)
CHLORIDE: 102 mmol/L (ref 96–106)
CO2: 27 mmol/L (ref 18–29)
Calcium: 9.5 mg/dL (ref 8.7–10.3)
Creatinine, Ser: 1.72 mg/dL — ABNORMAL HIGH (ref 0.57–1.00)
GFR calc non Af Amer: 28 mL/min/{1.73_m2} — ABNORMAL LOW (ref >59)
GFR, EST AFRICAN AMERICAN: 32 mL/min/{1.73_m2} — AB (ref >59)
GLOBULIN, TOTAL: 2.6 g/dL (ref 1.5–4.5)
GLUCOSE: 104 mg/dL — AB (ref 65–99)
POTASSIUM: 4.4 mmol/L (ref 3.5–5.2)
SODIUM: 142 mmol/L (ref 134–144)
TOTAL PROTEIN: 6.6 g/dL (ref 6.0–8.5)

## 2016-03-31 LAB — LIPID PANEL
CHOLESTEROL TOTAL: 143 mg/dL (ref 100–199)
Chol/HDL Ratio: 2.5 ratio units (ref 0.0–4.4)
HDL: 58 mg/dL (ref >39)
LDL Calculated: 72 mg/dL (ref 0–99)
Triglycerides: 67 mg/dL (ref 0–149)
VLDL CHOLESTEROL CAL: 13 mg/dL (ref 5–40)

## 2016-03-31 LAB — CBC WITH DIFFERENTIAL/PLATELET
BASOS: 1 %
Basophils Absolute: 0 10*3/uL (ref 0.0–0.2)
EOS (ABSOLUTE): 0.3 10*3/uL (ref 0.0–0.4)
EOS: 5 %
Hematocrit: 35.8 % (ref 34.0–46.6)
Hemoglobin: 12.1 g/dL (ref 11.1–15.9)
IMMATURE GRANS (ABS): 0 10*3/uL (ref 0.0–0.1)
IMMATURE GRANULOCYTES: 0 %
LYMPHS: 24 %
Lymphocytes Absolute: 1.4 10*3/uL (ref 0.7–3.1)
MCH: 31.3 pg (ref 26.6–33.0)
MCHC: 33.8 g/dL (ref 31.5–35.7)
MCV: 93 fL (ref 79–97)
MONOCYTES: 9 %
MONOS ABS: 0.5 10*3/uL (ref 0.1–0.9)
NEUTROS PCT: 61 %
Neutrophils Absolute: 3.6 10*3/uL (ref 1.4–7.0)
PLATELETS: 181 10*3/uL (ref 150–379)
RBC: 3.86 x10E6/uL (ref 3.77–5.28)
RDW: 14.2 % (ref 12.3–15.4)
WBC: 5.8 10*3/uL (ref 3.4–10.8)

## 2016-03-31 LAB — THYROID PANEL WITH TSH
Free Thyroxine Index: 2.7 (ref 1.2–4.9)
T3 Uptake Ratio: 31 % (ref 24–39)
T4, Total: 8.8 ug/dL (ref 4.5–12.0)
TSH: 3.29 u[IU]/mL (ref 0.450–4.500)

## 2016-05-10 HISTORY — PX: MASTECTOMY: SHX3

## 2016-06-14 ENCOUNTER — Encounter: Payer: Self-pay | Admitting: Nurse Practitioner

## 2016-06-14 ENCOUNTER — Ambulatory Visit (INDEPENDENT_AMBULATORY_CARE_PROVIDER_SITE_OTHER): Payer: Medicare Other | Admitting: Nurse Practitioner

## 2016-06-14 VITALS — Temp 97.5°F | Ht 63.0 in | Wt 184.0 lb

## 2016-06-14 DIAGNOSIS — S81812D Laceration without foreign body, left lower leg, subsequent encounter: Secondary | ICD-10-CM | POA: Diagnosis not present

## 2016-06-14 DIAGNOSIS — Z09 Encounter for follow-up examination after completed treatment for conditions other than malignant neoplasm: Secondary | ICD-10-CM

## 2016-06-14 NOTE — Patient Instructions (Signed)
How to Change Your Dressing Introduction A dressing is a material that is placed in and over wounds. A dressing helps your wound to heal by protecting it from:  Bacteria.  Worse injury.  Being too dry or too wet. What are the risks? The sticky (adhesive) tape that is used with a dressing may make your skin sore or irritated, or it may cause a rash. These are the most common problems. However, more serious problems can develop, such as:  Bleeding.  Infection. How to change your dressing Getting Ready to Change Your Dressing   Take a shower before you do the first dressing change of the day. If your doctor does not want your wound to get wet and your dressing is not waterproof, you may need to put plastic leak-proof sealing wrap on your dressing to protect it.  If needed, take pain medicine as told by your doctor 30 minutes before you change your dressing.  Set up a clean station for wound care. You will need:  A plastic trash bag that is open and ready to use.  Hand sanitizer.  Wound cleanser or salt-water solution (saline) as told by your doctor.  New dressing material or bandages. Make sure to open the dressing package so the dressing stays on the inside of the package. You may also need these supplies in your clean station:  A box of vinyl gloves.  Tape.  Skin protectant. This may be a wipe, film, or spray.  Clean or germ-free (sterile) scissors.  A cotton-tipped applicator. Taking Off Your Old Dressing  Wash your hands with soap and water. Dry your hands with a clean towel. If you cannot use soap and water, use hand sanitizer.  If you are using gloves, put on the gloves before you take off the dressing.  Gently take off any adhesive or tape by pulling it off in the direction of your hair growth. Only touch the outside edges of the dressing.  Take off the dressing. If the dressing sticks to your skin, wet the dressing with a germ-free salt-water solution. This  helps it come off more easily.  Take off any gauze or packing in your wound.  Throw the old dressing supplies into the ready trash bag.  Take off your gloves. To take off each glove, grab the cuff with your other hand and turn the glove inside out. Put the gloves in the trash right away.  Wash your hands with soap and water. Dry your hands with a clean towel. If you cannot use soap and water, use hand sanitizer. Cleaning Your Wound  Follow instructions from your doctor about how to clean your wound. This may include using a salt-water solution or recommended wound cleanser.  Do not use over-the-counter medicated or antiseptic creams, sprays, liquids, or dressings unless your doctor tells you to do that.  Use a clean gauze pad to clean the area fully with the salt-water solution or wound cleanser that your doctor recommends.  Throw the gauze pad into the trash bag.  Wash your hands with soap and water. Dry your hands with a clean towel. If you cannot use soap and water, use hand sanitizer. Putting on the Dressing  If your doctor recommended a skin protectant, put it on the skin around the wound.  Cover the wound with the recommended dressing, such as a nonstick gauze or bandage. Make sure to touch only the outside edges of the dressing. Do not touch the inside of the dressing.  Attach   the dressing so all sides stay in place. You may do this with the attached medical adhesive, roll gauze, or tape. If you use tape, do not wrap the tape all the way around your arm or leg.  Take off your gloves. Put them in the trash bag with the old dressing. Tie the bag shut and throw it away.  Wash your hands with soap and water. Dry your hands with a clean towel. If you cannot use soap and water, use hand sanitizer. Get help if:   You have new pain.  You have irritation, a rash, or itching around the wound or dressing.  Changing your dressing is painful.  Changing your dressing causes a lot of  bleeding. Get help right away if:  You have very bad pain.  You have signs of infection, such as:  More redness, swelling, or pain.  More fluid or blood.  Warmth.  Pus or a bad smell.  Red streaks leading from wound.  A fever. This information is not intended to replace advice given to you by your health care provider. Make sure you discuss any questions you have with your health care provider. Document Released: 07/23/2008 Document Revised: 10/02/2015 Document Reviewed: 01/30/2015  2017 Elsevier  

## 2016-06-14 NOTE — Progress Notes (Signed)
   Subjective:    Patient ID: Darlene Gregory, female    DOB: 1933/12/10, 82 y.o.   MRN: [id number]  HPI Darlene Gregory comes in today for hospital follow up- she hit her leg on car door and had to go to there ER in [place]. She had 14 stitches inserted and they told her swhe needed to see plastic surgeon- may need skin graft.    Review of Systems  Constitutional: Negative.   HENT: Negative.   Respiratory: Negative.   Cardiovascular: Negative.   Genitourinary: Negative.   Skin: Positive for wound (left shin).  Neurological: Negative.   Psychiatric/Behavioral: Negative.   All other systems reviewed and are negative.      Objective:   Physical Exam  Constitutional: She appears well-developed and well-nourished. No distress.  Cardiovascular: Normal rate, regular rhythm and normal heart sounds.   Pulmonary/Chest: Effort normal and breath sounds normal.  Skin: Skin is warm.     9cm side v shaped laceration to left shin. Wound edges are well approximated- no erythema- small blackened area on lateral side of wound.  Psychiatric: She has a normal mood and affect. Her behavior is normal. Judgment and thought content normal.    Temp 97.5 F (36.4 C) (Oral)   Ht 5\' 3"  (1.6 m)   Wt 184 lb (83.5 kg)   BMI 32.59 kg/m        Assessment & Plan:  1. Hospital discharge follow-up Hospital records brought in by patient were reviewed.  2. Laceration of left lower extremity, subsequent encounter Keep wound clean and dry Continue Augmentin as rx by ER Follow up in 3 days for recheck  [place], FNP

## 2016-06-17 ENCOUNTER — Encounter: Payer: Self-pay | Admitting: Nurse Practitioner

## 2016-06-17 ENCOUNTER — Ambulatory Visit (INDEPENDENT_AMBULATORY_CARE_PROVIDER_SITE_OTHER): Payer: Medicare Other | Admitting: Nurse Practitioner

## 2016-06-17 VITALS — BP 146/84 | HR 72 | Temp 98.6°F | Ht 63.0 in | Wt 184.0 lb

## 2016-06-17 DIAGNOSIS — S81812D Laceration without foreign body, left lower leg, subsequent encounter: Secondary | ICD-10-CM

## 2016-06-17 DIAGNOSIS — I10 Essential (primary) hypertension: Secondary | ICD-10-CM

## 2016-06-17 MED ORDER — MECLIZINE HCL 25 MG PO TABS: 25.0000 mg | ORAL_TABLET | Freq: Three times a day (TID) | ORAL | 0 refills | Status: DC | PRN

## 2016-06-17 MED ORDER — OLMESARTAN MEDOXOMIL-HCTZ 40-25 MG PO TABS: 1.0000 | ORAL_TABLET | Freq: Every day | ORAL | 5 refills | Status: DC

## 2016-06-17 NOTE — Addendum Note (Signed)
Addended by: Chevis Pretty on: 06/17/2016 12:46 PM   Modules accepted: Orders

## 2016-06-17 NOTE — Progress Notes (Signed)
   Subjective:    Patient ID: Darlene Gregory, female    DOB: [birth year], 83 y.o.   MRN: [id number]  HPI  Patient comes in today for recheck of laceration on left lower leg. SHe says it has had some bloody drainage but other wise doing good.  * patient wants to change blood pressure meds back to olmesartan since it is now generic. SHe likes it better then losartan.  Review of Systems  Constitutional: Negative.   HENT: Negative.   Respiratory: Negative.   Cardiovascular: Negative.   Genitourinary: Negative.   Neurological: Negative.   Psychiatric/Behavioral: Negative.   All other systems reviewed and are negative.      Objective:   Physical Exam  Constitutional: She appears well-developed and well-nourished.  Cardiovascular: Normal rate and regular rhythm.   Pulmonary/Chest: Effort normal and breath sounds normal.  Neurological: She is alert.  Skin: Skin is warm.  Wound edges on left shin well approximated with decreasing surrounding erythema.   BP (!) 146/84   Pulse 72   Temp 98.6 F (37 C) (Oral)   Ht 5\' 3"  (1.6 m)   Wt 184 lb (83.5 kg)   BMI 32.59 kg/m       Assessment & Plan:  1. Essential hypertension, benign Low sodium diet - olmesartan-hydrochlorothiazide (BENICAR HCT) 40-25 MG tablet; Take 1 tablet by mouth daily.  Dispense: 30 tablet; Refill: 5  2. Laceration of left lower extremity, subsequent encounter Continue current wound care Follow up Tuesday for stitch removal.  [name], FNP

## 2016-06-22 ENCOUNTER — Telehealth: Payer: Self-pay

## 2016-06-22 ENCOUNTER — Encounter: Payer: Self-pay | Admitting: Pediatrics

## 2016-06-22 ENCOUNTER — Ambulatory Visit (INDEPENDENT_AMBULATORY_CARE_PROVIDER_SITE_OTHER): Payer: Medicare Other | Admitting: Pediatrics

## 2016-06-22 VITALS — BP 133/64 | HR 63 | Temp 97.2°F | Ht 63.0 in | Wt 186.2 lb

## 2016-06-22 DIAGNOSIS — S81802D Unspecified open wound, left lower leg, subsequent encounter: Secondary | ICD-10-CM | POA: Diagnosis not present

## 2016-06-22 NOTE — Telephone Encounter (Signed)
Has appt with me later today, will talk with her then.

## 2016-06-22 NOTE — Progress Notes (Signed)
  Subjective:   Patient ID: Darlene Gregory, female    DOB: [birth year], 83 y.o.   MRN: [id number] CC: Suture / Staple Removal (Left leg)  HPI: Darlene Gregory is a 83 y.o. female presenting for Suture / Staple Removal (Left leg)  Ten days ago on 2/3 was getting out of car and hit L lower leg on corner of car door causing a flap of skin, was sutured at urgent care that day Has gotten slightly more tender when it is pressed on Started developing black along border of laceration within past 2-3 days No fevers Otherwise feeling well  Relevant past medical, surgical, family and social history reviewed. Allergies and medications reviewed and updated. History  Smoking Status  . Never Smoker  Smokeless Tobacco  . Never Used   ROS: Per HPI   Objective:    BP 133/64   Pulse 63   Temp 97.2 F (36.2 C) (Oral)   Ht 5\' 3"  (1.6 m)   Wt 186 lb 3.2 oz (84.5 kg)   BMI 32.98 kg/m   Wt Readings from Last 3 Encounters:  06/22/16 186 lb 3.2 oz (84.5 kg)  06/17/16 184 lb (83.5 kg)  06/14/16 184 lb (83.5 kg)    Gen: NAD, alert, cooperative with exam, NCAT EYES: EOMI, no conjunctival injection, or no icterus CV: NRRR, normal S1/S2, no murmur Resp: CTABL, no wheezes, normal WOB Neuro: Alert and oriented Skin: L anterior shin with two 4 cm edges of laceration closed with sutures forming flap. More distal flap edge with 2cm by 1 cm area of black eschar on area of flap, slight redness, tenderness along opposite side of flap. No redness along laceration edges. Skin does not appear to be healing together.  Assessment & Plan:  Makhya was seen today for suture / staple removal.  Diagnoses and all orders for this visit:  Wound of left lower extremity, subsequent encounter Poorly healing flap after injury with car door Sutures not removed today because of poor healing, looks like will cause opening in skin. May need skin graft Refer to plastic surgery, will try to get appt asap -     Ambulatory referral  to Plastic Surgery   Follow up plan: No Follow-up on file. Assunta Found, MD [place]

## 2016-09-06 ENCOUNTER — Other Ambulatory Visit: Payer: Self-pay | Admitting: Nurse Practitioner

## 2016-09-06 DIAGNOSIS — K219 Gastro-esophageal reflux disease without esophagitis: Secondary | ICD-10-CM

## 2016-09-07 ENCOUNTER — Other Ambulatory Visit: Payer: Self-pay | Admitting: *Deleted

## 2016-09-07 ENCOUNTER — Other Ambulatory Visit: Payer: Self-pay | Admitting: Nurse Practitioner

## 2016-09-07 NOTE — Telephone Encounter (Signed)
Creatine is getting to high to stay on mobic- has to stop taking

## 2016-09-08 ENCOUNTER — Other Ambulatory Visit: Payer: Self-pay | Admitting: Obstetrics and Gynecology

## 2016-09-08 DIAGNOSIS — R928 Other abnormal and inconclusive findings on diagnostic imaging of breast: Secondary | ICD-10-CM

## 2016-09-08 NOTE — Telephone Encounter (Signed)
busy

## 2016-09-08 NOTE — Telephone Encounter (Signed)
LMOVM not able to keep taking this medication due to increase in some lab values to call if she has any quesitons.

## 2016-09-10 ENCOUNTER — Ambulatory Visit
Admission: RE | Admit: 2016-09-10 | Discharge: 2016-09-10 | Disposition: A | Discharge status: Home / Self Care | Payer: Medicare Other | Source: Ambulatory Visit | Attending: Obstetrics and Gynecology | Admitting: Obstetrics and Gynecology

## 2016-09-10 ENCOUNTER — Other Ambulatory Visit: Payer: Self-pay | Admitting: Obstetrics and Gynecology

## 2016-09-10 DIAGNOSIS — R928 Other abnormal and inconclusive findings on diagnostic imaging of breast: Secondary | ICD-10-CM

## 2016-09-10 DIAGNOSIS — N6320 Unspecified lump in the left breast, unspecified quadrant: Secondary | ICD-10-CM

## 2016-09-10 DIAGNOSIS — R599 Enlarged lymph nodes, unspecified: Secondary | ICD-10-CM

## 2016-09-13 ENCOUNTER — Telehealth: Payer: Self-pay | Admitting: Nurse Practitioner

## 2016-09-13 NOTE — Telephone Encounter (Signed)
Patient states that she received a call to stop taking any mobic due to kidney functions. She said the next day that the mobic was filled and she picked up. She states that she needs something for pain. Can she take or not? Please advise

## 2016-09-13 NOTE — Telephone Encounter (Signed)
No neds to stop mobic because creatine is creeping up. Can take arthritis strength tylenol

## 2016-09-13 NOTE — Telephone Encounter (Signed)
Pt called - aware  

## 2016-09-24 ENCOUNTER — Ambulatory Visit
Admission: RE | Admit: 2016-09-24 | Discharge: 2016-09-24 | Disposition: A | Discharge status: Home / Self Care | Payer: Medicare Other | Source: Ambulatory Visit | Attending: Obstetrics and Gynecology | Admitting: Obstetrics and Gynecology

## 2016-09-24 ENCOUNTER — Other Ambulatory Visit: Payer: Self-pay | Admitting: Obstetrics and Gynecology

## 2016-09-24 DIAGNOSIS — R599 Enlarged lymph nodes, unspecified: Secondary | ICD-10-CM

## 2016-09-24 DIAGNOSIS — N6320 Unspecified lump in the left breast, unspecified quadrant: Secondary | ICD-10-CM

## 2016-09-28 ENCOUNTER — Encounter: Payer: Self-pay | Admitting: Nurse Practitioner

## 2016-09-28 ENCOUNTER — Ambulatory Visit (INDEPENDENT_AMBULATORY_CARE_PROVIDER_SITE_OTHER): Payer: Medicare Other | Admitting: Nurse Practitioner

## 2016-09-28 VITALS — BP 134/66 | HR 53 | Temp 98.6°F | Ht 63.0 in | Wt 179.0 lb

## 2016-09-28 DIAGNOSIS — D509 Iron deficiency anemia, unspecified: Secondary | ICD-10-CM | POA: Diagnosis not present

## 2016-09-28 DIAGNOSIS — I10 Essential (primary) hypertension: Secondary | ICD-10-CM | POA: Diagnosis not present

## 2016-09-28 DIAGNOSIS — E034 Atrophy of thyroid (acquired): Secondary | ICD-10-CM | POA: Diagnosis not present

## 2016-09-28 DIAGNOSIS — Z6832 Body mass index (BMI) 32.0-32.9, adult: Secondary | ICD-10-CM | POA: Diagnosis not present

## 2016-09-28 DIAGNOSIS — M1710 Unilateral primary osteoarthritis, unspecified knee: Secondary | ICD-10-CM

## 2016-09-28 DIAGNOSIS — F411 Generalized anxiety disorder: Secondary | ICD-10-CM

## 2016-09-28 DIAGNOSIS — Z23 Encounter for immunization: Secondary | ICD-10-CM

## 2016-09-28 DIAGNOSIS — K219 Gastro-esophageal reflux disease without esophagitis: Secondary | ICD-10-CM | POA: Diagnosis not present

## 2016-09-28 MED ORDER — CLONIDINE HCL 0.1 MG PO TABS: 0.1000 mg | ORAL_TABLET | Freq: Two times a day (BID) | ORAL | 1 refills | Status: DC

## 2016-09-28 MED ORDER — LEVOTHYROXINE SODIUM 75 MCG PO TABS: 75.0000 ug | ORAL_TABLET | Freq: Every day | ORAL | 1 refills | Status: DC

## 2016-09-28 NOTE — Addendum Note (Signed)
Addended by: Rolena Infante on: 09/28/2016 09:25 AM   Modules accepted: Orders

## 2016-09-28 NOTE — Progress Notes (Signed)
Subjective:    Patient ID: Darlene Gregory, female    DOB: 07-13-1933, 83 y.o.   MRN: [id number]  Darlene Gregory is here today for follow up of chronic medical problem.  Outpatient Encounter Prescriptions as of 09/28/2016  Medication Sig  . chlorpheniramine-HYDROcodone (TUSSIONEX) 10-8 MG/5ML SUER Take 5 mLs by mouth every 12 (twelve) hours as needed for cough.  . Cholecalciferol (VITAMIN D) 2000 UNITS CAPS Take by mouth daily.  . cloNIDine (CATAPRES) 0.1 MG tablet Take 1 tablet (0.1 mg total) by mouth 2 (two) times daily.  Marland Kitchen FIBER PO Take 2 each by mouth daily.   . fish oil-omega-3 fatty acids 1000 MG capsule Take 1 g by mouth daily.  Marland Kitchen levothyroxine (SYNTHROID, LEVOTHROID) 75 MCG tablet Take 1 tablet (75 mcg total) by mouth daily.  . meclizine (ANTIVERT) 25 MG tablet Take 1 tablet (25 mg total) by mouth 3 (three) times daily as needed for dizziness.  . Multiple Vitamins-Minerals (ICAPS PO) Take 1 tablet by mouth daily.   Marland Kitchen olmesartan-hydrochlorothiazide (BENICAR HCT) 40-25 MG tablet Take 1 tablet by mouth daily.  Marland Kitchen omeprazole (PRILOSEC) 20 MG capsule Take 1 capsule (20 mg total) by mouth daily.  . vitamin C (ASCORBIC ACID) 500 MG tablet Take 500 mg by mouth daily.    1. Essential hypertension, benign  Managed with clonidine.  Patient checks BP at home.  2. Gastroesophageal reflux disease without esophagitis Managed with omeprazole.  No complaints today.   3. Hypothyroidism due to acquired atrophy of thyroid  Patient takes levothyroxine for management.  4. Iron deficiency anemia, unspecified iron deficiency anemia type  Patient taking multivitamin for supplementation.  5. BMI 32.0-32.9,adult  Patient has lost 7 pounds since last visit.  6. GAD (generalized anxiety disorder)  Anxiety managed with stress management techniques.  No complaints today.  7. Arthritis of knee  Patient managed with weight bearing exercise.      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  Review of Systems  Constitutional: Negative for activity change and appetite change.  Respiratory: Negative for cough, chest tightness and shortness of breath.   Cardiovascular: Negative for chest pain and palpitations.  Gastrointestinal: Negative for abdominal distention and abdominal pain.  Neurological: Negative for dizziness and headaches.  All other systems reviewed and are negative.      Objective:   Physical Exam  Constitutional: She is oriented to person, place, and time. She appears well-developed and well-nourished.  HENT:  Head: Normocephalic.  Mouth/Throat: Oropharynx is clear and moist.  Eyes: Pupils are equal, round, and reactive to light.  Neck: Normal range of motion. Neck supple.  Cardiovascular: Normal rate, regular rhythm and normal heart sounds.   Pulmonary/Chest: Effort normal and breath sounds normal.  Abdominal: Soft. Bowel sounds are normal. There is no tenderness.  Musculoskeletal: Normal range of motion.  Neurological: She is alert and oriented to person, place, and time.  Skin: Skin is warm and dry.  Psychiatric: She has a normal mood and affect. Her behavior is normal. Judgment and thought content normal.    BP 134/66   Pulse (!) 53   Temp 98.6 F (37 C) (Oral)   Ht '5\' 3"'$  (1.6 m)   Wt 179 lb (81.2 kg)   SpO2 100%   BMI  31.71 kg/m      Assessment & Plan:  1. Essential hypertension, benign Low sodium diet - CMP14+EGFR - cloNIDine (CATAPRES) 0.1 MG tablet; Take 1 tablet (0.1 mg total) by mouth 2 (two) times daily.  Dispense: 180 tablet; Refill: 1  2. Gastroesophageal reflux disease without esophagitis Avoid spicy foods Do not eat 2 hours prior to bedtime  3. Hypothyroidism due to acquired atrophy of thyroid - levothyroxine (SYNTHROID, LEVOTHROID) 75 MCG  tablet; Take 1 tablet (75 mcg total) by mouth daily.  Dispense: 90 tablet; Refill: 1  4. Iron deficiency anemia, unspecified iron deficiency anemia type  5. BMI 32.0-32.9,adult Discussed diet and exercise for person with BMI >25 Will recheck weight in 3-6 months - Lipid panel  6. GAD (generalized anxiety disorder) Stress management  7. Arthritis of knee Ice bid  elevate when sitting  8. insomnia Try melatonin OTC   Labs pending Health maintenance reviewed Diet and exercise encouraged Continue all meds Follow up  In 3 months   [place], FNP

## 2016-09-28 NOTE — Patient Instructions (Addendum)
Insomnia Insomnia is a sleep disorder that makes it difficult to fall asleep or to stay asleep. Insomnia can cause tiredness (fatigue), low energy, difficulty concentrating, mood swings, and poor performance at work or school. There are three different ways to classify insomnia:  Difficulty falling asleep.  Difficulty staying asleep.  Waking up too early in the morning. Any type of insomnia can be long-term (chronic) or short-term (acute). Both are common. Short-term insomnia usually lasts for three months or less. Chronic insomnia occurs at least three times a week for longer than three months. What are the causes? Insomnia may be caused by another condition, situation, or substance, such as:  Anxiety.  Certain medicines.  Gastroesophageal reflux disease (GERD) or other gastrointestinal conditions.  Asthma or other breathing conditions.  Restless legs syndrome, sleep apnea, or other sleep disorders.  Chronic pain.  Menopause. This may include hot flashes.  Stroke.  Abuse of alcohol, tobacco, or illegal drugs.  Depression.  Caffeine.  Neurological disorders, such as Alzheimer disease.  An overactive thyroid (hyperthyroidism). The cause of insomnia may not be known. What increases the risk? Risk factors for insomnia include:  Gender. Women are more commonly affected than men.  Age. Insomnia is more common as you get older.  Stress. This may involve your professional or personal life.  Income. Insomnia is more common in people with lower income.  Lack of exercise.  Irregular work schedule or night shifts.  Traveling between different time zones. What are the signs or symptoms? If you have insomnia, trouble falling asleep or trouble staying asleep is the main symptom. This may lead to other symptoms, such as:  Feeling fatigued.  Feeling nervous about going to sleep.  Not feeling rested in the morning.  Having trouble concentrating.  Feeling irritable,  anxious, or depressed. How is this treated? Treatment for insomnia depends on the cause. If your insomnia is caused by an underlying condition, treatment will focus on addressing the condition. Treatment may also include:  Medicines to help you sleep.  Counseling or therapy.  Lifestyle adjustments. Follow these instructions at home:  Take medicines only as directed by your health care provider.  Keep regular sleeping and waking hours. Avoid naps.  Keep a sleep diary to help you and your health care provider figure out what could be causing your insomnia. Include:  When you sleep.  When you wake up during the night.  How well you sleep.  How rested you feel the next day.  Any side effects of medicines you are taking.  What you eat and drink.  Make your bedroom a comfortable place where it is easy to fall asleep:  Put up shades or special blackout curtains to block light from outside.  Use a white noise machine to block noise.  Keep the temperature cool.  Exercise regularly as directed by your health care provider. Avoid exercising right before bedtime.  Use relaxation techniques to manage stress. Ask your health care provider to suggest some techniques that may work well for you. These may include:  Breathing exercises.  Routines to release muscle tension.  Visualizing peaceful scenes.  Cut back on alcohol, caffeinated beverages, and cigarettes, especially close to bedtime. These can disrupt your sleep.  Do not overeat or eat spicy foods right before bedtime. This can lead to digestive discomfort that can make it hard for you to sleep.  Limit screen use before bedtime. This includes:  Watching TV.  Using your smartphone, tablet, and computer.  Stick to a   routine. This can help you fall asleep faster. Try to do a quiet activity, brush your teeth, and go to bed at the same time each night.  Get out of bed if you are still awake after 15 minutes of trying to  sleep. Keep the lights down, but try reading or doing a quiet activity. When you feel sleepy, go back to bed.  Make sure that you drive carefully. Avoid driving if you feel very sleepy.  Keep all follow-up appointments as directed by your health care provider. This is important. Contact a health care provider if:  You are tired throughout the day or have trouble in your daily routine due to sleepiness.  You continue to have sleep problems or your sleep problems get worse. Get help right away if:  You have serious thoughts about hurting yourself or someone else. This information is not intended to replace advice given to you by your health care provider. Make sure you discuss any questions you have with your health care provider. Document Released: 04/23/2000 Document Revised: 09/26/2015 Document Reviewed: 01/25/2014 Elsevier Interactive Patient Education  2017 [employer]  

## 2016-09-29 LAB — CMP14+EGFR
ALK PHOS: 101 IU/L (ref 39–117)
ALT: 10 IU/L (ref 0–32)
AST: 14 IU/L (ref 0–40)
Albumin/Globulin Ratio: 1.6 (ref 1.2–2.2)
Albumin: 4 g/dL (ref 3.5–4.7)
BUN / CREAT RATIO: 17 (ref 12–28)
BUN: 29 mg/dL — AB (ref 8–27)
Bilirubin Total: 0.4 mg/dL (ref 0.0–1.2)
CHLORIDE: 101 mmol/L (ref 96–106)
CO2: 27 mmol/L (ref 18–29)
CREATININE: 1.75 mg/dL — AB (ref 0.57–1.00)
Calcium: 9.7 mg/dL (ref 8.7–10.3)
GFR calc Af Amer: 31 mL/min/{1.73_m2} — ABNORMAL LOW (ref >59)
GFR calc non Af Amer: 27 mL/min/{1.73_m2} — ABNORMAL LOW (ref >59)
GLUCOSE: 106 mg/dL — AB (ref 65–99)
Globulin, Total: 2.5 g/dL (ref 1.5–4.5)
Potassium: 4.5 mmol/L (ref 3.5–5.2)
Sodium: 141 mmol/L (ref 134–144)
Total Protein: 6.5 g/dL (ref 6.0–8.5)

## 2016-09-29 LAB — LIPID PANEL
CHOLESTEROL TOTAL: 141 mg/dL (ref 100–199)
Chol/HDL Ratio: 2.3 ratio (ref 0.0–4.4)
HDL: 61 mg/dL (ref >39)
LDL CALC: 66 mg/dL (ref 0–99)
TRIGLYCERIDES: 71 mg/dL (ref 0–149)
VLDL CHOLESTEROL CAL: 14 mg/dL (ref 5–40)

## 2016-09-30 ENCOUNTER — Other Ambulatory Visit: Payer: Self-pay | Admitting: *Deleted

## 2016-09-30 ENCOUNTER — Encounter: Payer: Self-pay | Admitting: *Deleted

## 2016-09-30 ENCOUNTER — Telehealth: Payer: Self-pay | Admitting: *Deleted

## 2016-09-30 DIAGNOSIS — C50412 Malignant neoplasm of upper-outer quadrant of left female breast: Secondary | ICD-10-CM

## 2016-09-30 DIAGNOSIS — Z170 Estrogen receptor positive status [ER+]: Secondary | ICD-10-CM | POA: Insufficient documentation

## 2016-09-30 NOTE — Telephone Encounter (Signed)
Mailed [facility] packet to pt. 

## 2016-09-30 NOTE — Telephone Encounter (Signed)
Confirmed [facility] for 10/06/16 at 1215 .  Instructions and contact information given.

## 2016-10-05 ENCOUNTER — Telehealth: Payer: Self-pay | Admitting: Nurse Practitioner

## 2016-10-05 NOTE — Telephone Encounter (Signed)
Pt aware of recent labs

## 2016-10-06 ENCOUNTER — Ambulatory Visit
Admission: RE | Admit: 2016-10-06 | Discharge: 2016-10-06 | Disposition: A | Discharge status: Home / Self Care | Payer: Medicare Other | Source: Ambulatory Visit | Attending: Radiation Oncology | Admitting: Radiation Oncology

## 2016-10-06 ENCOUNTER — Ambulatory Visit (HOSPITAL_BASED_OUTPATIENT_CLINIC_OR_DEPARTMENT_OTHER): Payer: Medicare Other | Admitting: Oncology

## 2016-10-06 ENCOUNTER — Ambulatory Visit: Payer: Medicare Other | Admitting: Physical Therapy

## 2016-10-06 ENCOUNTER — Encounter: Payer: Self-pay | Admitting: General Practice

## 2016-10-06 ENCOUNTER — Other Ambulatory Visit (HOSPITAL_BASED_OUTPATIENT_CLINIC_OR_DEPARTMENT_OTHER): Payer: Medicare Other

## 2016-10-06 ENCOUNTER — Ambulatory Visit: Payer: Self-pay | Admitting: General Surgery

## 2016-10-06 ENCOUNTER — Encounter: Payer: Self-pay | Admitting: Oncology

## 2016-10-06 VITALS — BP 165/84 | HR 60 | Temp 97.4°F | Resp 18 | Ht 63.0 in | Wt 184.2 lb

## 2016-10-06 DIAGNOSIS — C50412 Malignant neoplasm of upper-outer quadrant of left female breast: Secondary | ICD-10-CM

## 2016-10-06 DIAGNOSIS — Z170 Estrogen receptor positive status [ER+]: Principal | ICD-10-CM

## 2016-10-06 LAB — CBC WITH DIFFERENTIAL/PLATELET
BASO%: 0.9 % (ref 0.0–2.0)
Basophils Absolute: 0.1 10*3/uL (ref 0.0–0.1)
EOS%: 3.7 % (ref 0.0–7.0)
Eosinophils Absolute: 0.3 10*3/uL (ref 0.0–0.5)
HEMATOCRIT: 37.4 % (ref 34.8–46.6)
HGB: 12.5 g/dL (ref 11.6–15.9)
LYMPH%: 22.8 % (ref 14.0–49.7)
MCH: 31.3 pg (ref 25.1–34.0)
MCHC: 33.3 g/dL (ref 31.5–36.0)
MCV: 93.9 fL (ref 79.5–101.0)
MONO#: 0.5 10*3/uL (ref 0.1–0.9)
MONO%: 7.5 % (ref 0.0–14.0)
NEUT#: 4.5 10*3/uL (ref 1.5–6.5)
NEUT%: 65.1 % (ref 38.4–76.8)
Platelets: 186 10*3/uL (ref 145–400)
RBC: 3.98 10*6/uL (ref 3.70–5.45)
RDW: 13.1 % (ref 11.2–14.5)
WBC: 7 10*3/uL (ref 3.9–10.3)
lymph#: 1.6 10*3/uL (ref 0.9–3.3)

## 2016-10-06 LAB — COMPREHENSIVE METABOLIC PANEL
ALT: 13 U/L (ref 0–55)
AST: 17 U/L (ref 5–34)
Albumin: 3.8 g/dL (ref 3.5–5.0)
Alkaline Phosphatase: 100 U/L (ref 40–150)
Anion Gap: 8 mEq/L (ref 3–11)
BUN: 36.2 mg/dL — AB (ref 7.0–26.0)
CHLORIDE: 105 meq/L (ref 98–109)
CO2: 28 meq/L (ref 22–29)
CREATININE: 1.9 mg/dL — AB (ref 0.6–1.1)
Calcium: 10.1 mg/dL (ref 8.4–10.4)
EGFR: 24 mL/min/{1.73_m2} — ABNORMAL LOW (ref >90)
Glucose: 116 mg/dl (ref 70–140)
Potassium: 4.5 mEq/L (ref 3.5–5.1)
Sodium: 140 mEq/L (ref 136–145)
Total Bilirubin: 0.4 mg/dL (ref 0.20–1.20)
Total Protein: 7.3 g/dL (ref 6.4–8.3)

## 2016-10-06 NOTE — Progress Notes (Signed)
Nutrition Assessment  Reason for Assessment:  Pt seen in Breast Clinic  ASSESSMENT:   83 year old female with new diagnosis of breast cancer.  Past medical history of HTN  Patient reports good appetite prior to coming.  Medications:  reviewed  Labs: BUN 36, creatinine 1.9  Anthropometrics:   Height: 63 inches Weight: 184 lb BMI: 32.7   NUTRITION DIAGNOSIS: Food and nutrition related knowledge deficit related to new diagnosis of breast cancer as evidenced by no prior need for nutrition related information.  INTERVENTION:   Discussed and provided packet of information regarding nutritional tips for breast cancer patients.  Questions answered.  Teachback method used.  Contact information provided and patient knows to contact me with questions/concerns.    MONITORING, EVALUATION, and GOAL: Pt will consume a healthy plant based diet to maintain lean body mass throughout treatment.   Darlene Gregory, [place], [place] Registered Dietitian [phone] (pager)

## 2016-10-06 NOTE — Progress Notes (Signed)
[facility] Psychosocial Distress Screening Spiritual Care  Met with Darlene Gregory, her husband, and her son who lives in [place] in [facility] to introduce [facility] team/resources, reviewing distress screen per protocol.  The patient scored a 0 on the Psychosocial Distress Thermometer which indicates minimal distress. Also assessed for distress and other psychosocial needs.   ON[facility]N DISTRESS SCREENING 10/06/2016  Screening Type Initial Screening  Distress experienced in past week (1-10) 0  Referral to support programs Yes   Per pt, she has wonderful support from family, friends, and church [employer] in [place], [place]).  Her son remarks that she is very busy and engaged in her town; she confirms that she "would rather be doing for others than to be done for."  Darlene Gregory notes that she tends not to worry about things she can't control and feels comforted by her faith; in this case, she is also happy to hear the good news about the limited scope of her dx/tx.   Follow up needed: No.  Pt and family report no other needs at this time, but are aware of [facility] team/programming availability should need/desire arise.  Please also page if circumstances change.  Thank you.   [place], [place], [facility] Pager [phone] Voicemail [phone]

## 2016-10-06 NOTE — Progress Notes (Signed)
Radiation Oncology         [phone] ________________________________  Initial outpatient Consultation  Name: Darlene Gregory MRN: [id number]  Date: 10/06/2016  DOB: 1933-07-13  UU:VOZDGU, Mary-Margaret, FNP  Excell Seltzer, MD   REFERRING PHYSICIAN: Excell Seltzer, MD  DIAGNOSIS:    ICD-9-CM ICD-10-CM   1. Malignant neoplasm of upper-outer quadrant of left breast in female, estrogen receptor positive ([place]) 174.4 C50.412    V86.0 Z17.0    Cancer Staging Malignant neoplasm of upper-outer quadrant of left breast in female, estrogen receptor positive ([place]) Staging form: Breast, AJCC 8th Edition - Clinical stage from 10/06/2016: Stage IA (cT1b, cN0, cM0, G2, ER: Positive, PR: Positive, HER2: Negative) - Unsigned Staging comments: Staged at breast conference on 5.30.18  cT1bN0M0 Left Breast UOQ Invasive Ductal Carcinoma, ER+ / PR+ / Her2-, Grade 2  CHIEF COMPLAINT: Here to discuss management of left breast cancer  HISTORY OF PRESENT ILLNESS::Darlene Gregory is a 83 y.o. female who had a screening mammogram in April 2018 showing a possible left breast asymmetry. Diagnostic mammogram of the left breast and left breast US on 09/10/16 showed a 0.9 x 0.7 x 0.5 cm mass in the 1:30 position 3 cm from the nipple. US of the left axilla showed a single left axillary lymph node with mild, irregular, eccentric cortical thickening measuring 0.6 cm in diameter and a cortical thickness of 0.4 cm.  Biopsies were performed on 09/24/16. Biopsy of the UOQ left breast revealed grade grade 2 IDC and DCIS (ER 100% +, PR 95% +, HER2 -, Ki67 5%). Biopsy of a level 1 left axillary lymph node was negative for carcinoma.  The patient presents today in multidisciplinary breast clinic with her husband and son to discuss treatment options for the management of her disease.  She took HRT for years after hysterectomy, but hasn't been on HRT for years since.  PREVIOUS RADIATION THERAPY: No  PAST MEDICAL HISTORY:   has a past medical history of Difficult intubation; DJD (degenerative joint disease); Frequent urination; GERD (gastroesophageal reflux disease); Hypertension; Hypothyroidism; Osteoporosis; and Thyroid disease.    PAST SURGICAL HISTORY: Past Surgical History:  Procedure Laterality Date  . ABDOMINAL HYSTERECTOMY    . CAROTID BODY TUMOR EXCISION    . COLONOSCOPY W/ POLYPECTOMY    . EYE SURGERY Bilateral    cataract removed  . JOINT REPLACEMENT Right 2006   knee  . PAROTIDECTOMY Left   . TOTAL KNEE ARTHROPLASTY Left 03/13/2014   Procedure: LEFT TOTAL KNEE ARTHROPLASTY;  Surgeon: Kerin Salen, MD;  Location: [facility];  Service: Orthopedics;  Laterality: Left;    FAMILY HISTORY: family history includes Emphysema in her father; Heart disease in her mother.  SOCIAL HISTORY:  reports that she has quit smoking. She has never used smokeless tobacco. She reports that she does not drink alcohol or use drugs.  ALLERGIES: Hyoscyamine sulfate; Norvasc [amlodipine besylate]; Phenazopyridine hcl; and Sulfonamide derivatives  MEDICATIONS:  Current Outpatient Prescriptions  Medication Sig Dispense Refill  . Cholecalciferol (VITAMIN D) 2000 UNITS CAPS Take by mouth daily.    . cloNIDine (CATAPRES) 0.1 MG tablet Take 1 tablet (0.1 mg total) by mouth 2 (two) times daily. 180 tablet 1  . FIBER PO Take 2 each by mouth daily.     . fish oil-omega-3 fatty acids 1000 MG capsule Take 1 g by mouth daily.    Marland Kitchen levothyroxine (SYNTHROID, LEVOTHROID) 75 MCG tablet Take 1 tablet (75 mcg total) by mouth daily. 90 tablet 1  .  Multiple Vitamins-Minerals (ICAPS PO) Take 1 tablet by mouth daily.     Marland Kitchen olmesartan-hydrochlorothiazide (BENICAR HCT) 40-25 MG tablet Take 1 tablet by mouth daily. 30 tablet 5  . omeprazole (PRILOSEC) 20 MG capsule Take 1 capsule (20 mg total) by mouth daily. 90 capsule 1  . vitamin C (ASCORBIC ACID) 500 MG tablet Take 500 mg by mouth daily.     No current facility-administered medications for  this encounter.     REVIEW OF SYSTEMS: A 10+ POINT REVIEW OF SYSTEMS WAS OBTAINED including neurology, dermatology, psychiatry, cardiac, respiratory, lymph, extremities, GI, GU, Musculoskeletal, constitutional, breasts, reproductive, HEENT. The patient wears glasses and has an abdominal hernia. A complete review of systems is obtained and is otherwise negative.    PHYSICAL EXAM:  Vitals with BMI 10/06/2016  Height _0   Weight 184 lbs 3 oz  BMI 72.0  Systolic 947  Diastolic 84  Pulse 60  Respirations 18  General: Alert and oriented, in no acute distress HEENT: Head is normocephalic. Extraocular movements are intact. Oropharynx is clear. Neck: Neck is supple, no palpable cervical or supraclavicular lymphadenopathy. Heart: (+) Soft murmur in the aortic and pulmonary regions of the heart Chest: Clear to auscultation bilaterally, with no rhonchi, wheezes, or rales. Abdomen: Soft, nontender, nondistended, with no rigidity or guarding. Extremities: No cyanosis. (+) Trace edema in the bilateral lower extremities. Lymphatics: see Neck Exam Skin: No concerning lesions. Musculoskeletal: symmetric strength and muscle tone throughout. Neurologic: Cranial nerves II through XII are grossly intact. No obvious focalities. Speech is fluent. Coordination is intact. Psychiatric: Judgment and insight are intact. Affect is appropriate. Breasts: 1 cm mass in the UOQ of the left breast with post-biopsy bruising. No other palpable masses appreciated in the breasts or axillae.  ECOG = 1  0 - Asymptomatic (Fully active, able to carry on all predisease activities without restriction)  1 - Symptomatic but completely ambulatory (Restricted in physically strenuous activity but ambulatory and able to carry out work of a light or sedentary nature. For example, light housework, office work)  2 - Symptomatic, <50% in bed during the day (Ambulatory and capable of all self care but unable to carry out any work  activities. Up and about more than 50% of waking hours)  3 - Symptomatic, >50% in bed, but not bedbound (Capable of only limited self-care, confined to bed or chair 50% or more of waking hours)  4 - Bedbound (Completely disabled. Cannot carry on any self-care. Totally confined to bed or chair)  5 - Death   Eustace Pen MM, Creech RH, Tormey DC, et al. [phone]). "Toxicity and response criteria of the [facility]". [place] Oncol. 5 (6): 649-55   LABORATORY DATA:  Lab Results  Component Value Date   WBC 7.0 10/06/2016   HGB 12.5 10/06/2016   HCT 37.4 10/06/2016   MCV 93.9 10/06/2016   PLT 186 10/06/2016   CMP     Component Value Date/Time   NA 140 10/06/2016 1232   K 4.5 10/06/2016 1232   CL 101 09/28/2016 0915   CO2 28 10/06/2016 1232   GLUCOSE 116 10/06/2016 1232   BUN 36.2 (H) 10/06/2016 1232   CREATININE 1.9 (H) 10/06/2016 1232   CALCIUM 10.1 10/06/2016 1232   PROT 7.3 10/06/2016 1232   ALBUMIN 3.8 10/06/2016 1232   AST 17 10/06/2016 1232   ALT 13 10/06/2016 1232   ALKPHOS 100 10/06/2016 1232   BILITOT 0.40 10/06/2016 1232   GFRNONAA 27 (L) 09/28/2016  [place] (L) 11/24/2012 0823   GFRAA 31 (L) 09/28/2016 0915   GFRAA 56 (L) 11/24/2012 0823         RADIOGRAPHY: US Breast Ltd Uni Left Inc Axilla  Result Date: 09/10/2016 CLINICAL DATA:  Possible left breast asymmetry on a recent 2D screening mammogram. EXAM: 2D DIGITAL DIAGNOSTIC LEFT MAMMOGRAM WITH CAD AND ADJUNCT TOMO ULTRASOUND LEFT BREAST COMPARISON:  Previous exam(s). ACR Breast Density Category c: The breast tissue is heterogeneously dense, which may obscure small masses. FINDINGS: 2D and 3D tomographic views of the left breast were obtained. These demonstrate an area of architectural distortion in the upper-outer quadrant of the breast. No findings elsewhere in the left breast suspicious for malignancy. Mammographic images were processed with CAD. On physical exam, no discrete mass is  palpable in the upper outer left breast or left axilla. Targeted ultrasound is performed, showing a 9 x 7 x 5 mm vertically oriented, irregular hypoechoic mass with poorly defined margins in the 1:30 o'clock position of the left breast, 3 cm from the nipple. This exhibits posterior acoustical shadowing. Ultrasound of the left axilla demonstrates a single left axillary lymph node with mild, irregular, eccentric cortical thickening. This node measures 6 mm in maximum diameter with a maximum cortical thickness of 4 mm. The remainder of the left axillary lymph nodes have normal appearances. IMPRESSION: 1. 9 x 7 x 5 mm mass in the 1:30 o'clock position of the left breast with associated architectural distortion. This has imaging features suspicious for malignancy. 2. Single left axillary lymph node with mild eccentric, irregular cortical thickening, suspicious for a metastatic lymph node. RECOMMENDATION: Ultrasound-guided core needle biopsy of the 9 mm mass in the 1:30 o'clock position of the left breast and ultrasound-guided core needle biopsy of the suspicious left axillary lymph node. This has been discussed with the patient, her husband and her son and scheduled at 1:45 p.m. on 09/24/2016. I have discussed the findings and recommendations with the patient. Results were also provided in writing at the conclusion of the visit. If applicable, a reminder letter will be sent to the patient regarding the next appointment. BI-RADS CATEGORY  4: Suspicious. Electronically Signed   By: Claudie Revering M.D.   On: 09/10/2016 12:11   Mm Diag Breast Tomo Uni Left  Result Date: 09/10/2016 CLINICAL DATA:  Possible left breast asymmetry on a recent 2D screening mammogram. EXAM: 2D DIGITAL DIAGNOSTIC LEFT MAMMOGRAM WITH CAD AND ADJUNCT TOMO ULTRASOUND LEFT BREAST COMPARISON:  Previous exam(s). ACR Breast Density Category c: The breast tissue is heterogeneously dense, which may obscure small masses. FINDINGS: 2D and 3D tomographic  views of the left breast were obtained. These demonstrate an area of architectural distortion in the upper-outer quadrant of the breast. No findings elsewhere in the left breast suspicious for malignancy. Mammographic images were processed with CAD. On physical exam, no discrete mass is palpable in the upper outer left breast or left axilla. Targeted ultrasound is performed, showing a 9 x 7 x 5 mm vertically oriented, irregular hypoechoic mass with poorly defined margins in the 1:30 o'clock position of the left breast, 3 cm from the nipple. This exhibits posterior acoustical shadowing. Ultrasound of the left axilla demonstrates a single left axillary lymph node with mild, irregular, eccentric cortical thickening. This node measures 6 mm in maximum diameter with a maximum cortical thickness of 4 mm. The remainder of the left axillary lymph nodes have normal appearances. IMPRESSION: 1. 9 x 7 x 5 mm mass  in the 1:30 o'clock position of the left breast with associated architectural distortion. This has imaging features suspicious for malignancy. 2. Single left axillary lymph node with mild eccentric, irregular cortical thickening, suspicious for a metastatic lymph node. RECOMMENDATION: Ultrasound-guided core needle biopsy of the 9 mm mass in the 1:30 o'clock position of the left breast and ultrasound-guided core needle biopsy of the suspicious left axillary lymph node. This has been discussed with the patient, her husband and her son and scheduled at 1:45 p.m. on 09/24/2016. I have discussed the findings and recommendations with the patient. Results were also provided in writing at the conclusion of the visit. If applicable, a reminder letter will be sent to the patient regarding the next appointment. BI-RADS CATEGORY  4: Suspicious. Electronically Signed   By: Claudie Revering M.D.   On: 09/10/2016 12:11   [place] Axillary Node Core Biopsy Left  Addendum Date: 09/27/2016   ADDENDUM REPORT: 09/27/2016 11:15 ADDENDUM:  Pathology revealed grade II invasive ductal carcinoma and ductal carcinoma in situ in the left breast and no carcinoma identified in the left axillary lymph node. This was found to be concordant by Dr. Peggye Fothergill. Pathology results were discussed with the patient by telephone. The patient reported doing well after the biopsies with tenderness at the sites. Post biopsy instructions and care were reviewed and questions were answered. The patient was encouraged to call [facility] for any additional concerns. The patient was referred to the [facility] the [facility] on Oct 06, 2016. Pathology results reported by Susa Raring RN, BSN on 09/27/2016. Electronically Signed   By: Evangeline Dakin M.D.   On: 09/27/2016 11:15   Result Date: 09/27/2016 CLINICAL DATA:  83 year old presenting with a screening detected 9 mm mass in the upper outer quadrant of the left breast at the 1:30 o'clock position approximately 3 cm from the nipple. A solitary pathologic left axillary lymph node with cortical thickening up to approximately 6 mm was also identified at the time of diagnostic workup. EXAM: ULTRASOUND GUIDED CORE NEEDLE BIOPSY OF A LEFT AXILLARY NODE COMPARISON:  Previous exam(s). FINDINGS: I met with the patient and we discussed the procedure of ultrasound-guided biopsy, including benefits and alternatives. We discussed the high likelihood of a successful procedure. We discussed the risks of the procedure, including infection, bleeding, tissue injury, clip migration, and inadequate sampling. Informed written consent was given. The usual time-out protocol was performed immediately prior to the procedure. Using sterile technique with chlorhexidine and skin antisepsis, 1% Lidocaine as local anesthetic, under direct ultrasound visualization, a 14 gauge Bard Marquee core needle device was used to perform biopsy of the solitary pathologic left  axillary lymph using an inferolateral approach. At the conclusion of the procedure a HydroMark spiral shaped tissue marker clip was deployed into the biopsy cavity. Follow up 2 view mammogram was performed and dictated separately. IMPRESSION: Ultrasound guided biopsy of a solitary level 1 pathologic left axillary lymph node. No apparent complications. Electronically Signed: By: Evangeline Dakin M.D. On: 09/24/2016 15:01   Mm Clip Placement Left  Result Date: 09/24/2016 CLINICAL DATA:  Confirmation clip placement after ultrasound-guided core needle biopsy of a mass in the upper outer quadrant of the left breast. A solitary pathologic left axillary lymph node was also biopsied. EXAM: DIAGNOSTIC LEFT MAMMOGRAM POST ULTRASOUND BIOPSY COMPARISON:  Previous exam(s). FINDINGS: Mammographic images were obtained following ultrasound guided biopsy of a mass in the upper outer quadrant of  the left breast at the 1:30 o'clock position approximately 3 cm from the nipple. The ribbon shaped tissue marker clip is appropriately positioned immediately adjacent to the biopsied mass in the upper outer quadrant. The [facility] spiral shaped tissue marker clip placed within the biopsied lymph node in the left axilla is not visible on mammography. Expected post biopsy changes are present without evidence of hematoma. IMPRESSION: Appropriate positioning of the ribbon shaped tissue marker clip immediately adjacent to the biopsied mass in the upper outer quadrant of the left breast. Final Assessment: Post Procedure Mammograms for Marker Placement Electronically Signed   By: Evangeline Dakin M.D.   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 This was found to be  concordant by Dr. Peggye Fothergill. Pathology results were discussed with the patient by telephone. The patient reported doing well after the biopsies with tenderness at the sites. Post biopsy instructions and care were reviewed and questions were answered. The patient was encouraged to call [facility] for any additional concerns. The patient was referred to the [facility] at the [facility] on Oct 06, 2016. Pathology results reported by Susa Raring RN, BSN on 09/27/2016. Electronically Signed   By: Evangeline Dakin M.D.   On: 09/27/2016 11:14   Result Date: 09/27/2016 CLINICAL DATA:  83 year old presenting with a screening detected 9 mm mass in the upper outer quadrant of the left breast at the 1:30 o'clock position approximately 3 cm from the nipple. A solitary pathologic left axillary lymph node with cortical thickening up to approximately 6 mm was also identified at the time of diagnostic workup. EXAM: ULTRASOUND GUIDED LEFT BREAST CORE NEEDLE BIOPSY COMPARISON:  Previous exam(s). FINDINGS: I met with the patient and we discussed the procedure of ultrasound-guided biopsy, including benefits and alternatives. We discussed the high likelihood of a successful procedure. We discussed the risks of the procedure, including infection, bleeding, tissue injury, clip migration, and inadequate sampling. Informed written consent was given. The usual time-out protocol was performed immediately prior to the procedure. Lesion quadrant: Upper outer quadrant. Using sterile technique with chlorhexidine as skin antisepsis, 1% Lidocaine as local anesthetic, under direct ultrasound visualization, a 12 gauge Bard marking core needle device was used to perform biopsy of a suspicious 9 mm mass in the upper outer quadrant of the left breast using a lateral approach. At the conclusion of the procedure a ribbon shaped tissue marker clip was deployed into the  biopsy cavity. Follow up 2 view mammogram was performed and dictated separately. IMPRESSION: Ultrasound guided biopsy of a 9 mm mass in the upper outer quadrant of the left breast. No apparent complications. Electronically Signed: By: Evangeline Dakin M.D. On: 09/24/2016 15:00      IMPRESSION/PLAN: Left Breast Cancer, ER+ , stage I  I spoke to the patient today regarding her diagnosis and options for treatment. We discussed the equivalence in terms of survival and local failure between mastectomy and breast conservation. We discussed the role of radiation in decreasing local failures in patients who undergo lumpectomy. We discussed the low rates of failure in patients her age with these non aggressive breast cancers. We discussed the randomized trial in elderly  women showing equivalent survival when omitting radiation and taking an anti estrogen pill instead. We discussed the randomized trial showing a slight improvement in local control with the combination. We discussed the differences between systemic and local treatment.  The patient is leaning towards taking an adjuvant AI and not undergoing radiation. I believe that this is appropriate. The patient asked about possibly having medical oncology care in [place] and she should discuss this with Dr. Jana Hakim. __________________________________________     Eppie Gibson, MD  This document serves as a record of services personally performed by Eppie Gibson, MD. It was created on her behalf by Darcus Austin, a trained medical scribe. The creation of this record is based on the scribe's personal observations and the provider's statements to them. This document has been checked and approved by the attending provider.

## 2016-10-07 NOTE — Progress Notes (Signed)
[facility]  Telephone:([phone]) [phone] Fax:(336) [phone]     ID: Darlene Gregory DOB: 1933-10-23  MR#: [id number]  DUK#:[id number]  Patient Care Team: Chevis Pretty, [place] as PCP - General (Nurse Practitioner) Excell Seltzer, MD as Consulting Physician (General Surgery) Laqueta Bonaventura, Virgie Dad, MD as Consulting Physician (Oncology) Eppie Gibson, MD as Attending Physician (Radiation Oncology) Chauncey Cruel, MD OTHER MD:  CHIEF COMPLAINT: Estrogen receptor positive breast cancer  CURRENT TREATMENT: Awaiting definitive therapy   BREAST CANCER HISTORY: Ms. Schiller had screening mammography April 2018 showing a possible mass in the left breast. She was referred to the [facility] where she underwent left mammography and ultrasonography on 09/10/2016. This showed the breast density to be category C. In the upper outer quadrant of the breast there was an area of architectural distortion which was not palpable. Ultrasonography confirmed a 0.9 cm hypoechoic mass at the 1:30 o'clock position of the left breast 3 cm from the nipple. Ultrasound of the left axilla showed a suspicious lymph node.  On 09/24/2016 the patient underwent biopsy of the left breast mass and the left axillary lymph node in question. The lymph node was benign, and this was read as concordant by radiology. The left breast mass however showed invasive ductal carcinoma, grade 2, estrogen receptor 100% positive, progesterone receptor 95% positive, both with strong staining intensity, with an MIB-1 of 5%, and no HER-2 amplification, the signals ratio being 1.46 and the number per cell 1.90.  The patient's subsequent history is as detailed below  INTERVAL HISTORY: The patient was evaluated in the multidisciplinary breast cancer clinic 10/06/2016 accompanied by her husband [name] and their son [name]. Her case was also presented in the multidisciplinary breast cancer conference that same morning. At that time a  preliminary plan was proposed:  REVIEW OF SYSTEMS: There were no specific symptoms leading to the original mammogram, which was routinely scheduled. The patient denies unusual headaches, visual changes, nausea, vomiting, stiff neck, dizziness, or gait imbalance. There has been no cough, phlegm production, or pleurisy, no chest pain or pressure, and no change in bowel or bladder habits. The patient denies fever, rash, bleeding, unexplained fatigue or unexplained weight loss. A detailed review of systems was otherwise entirely negative.  PAST MEDICAL HISTORY: Past Medical History:  Diagnosis Date  . Difficult intubation    "small trachea"; 01/18/05: glidescope but unable to pass stylet -->fast track LMA with blind passage for ETT, consider awake intubation; 08/14/08: IV induction and DL with glidesecope for ETT  . DJD (degenerative joint disease)   . Frequent urination   . GERD (gastroesophageal reflux disease)   . Hypertension   . Hypothyroidism   . Osteoporosis   . Thyroid disease     PAST SURGICAL HISTORY: Past Surgical History:  Procedure Laterality Date  . ABDOMINAL HYSTERECTOMY    . CAROTID BODY TUMOR EXCISION    . COLONOSCOPY W/ POLYPECTOMY    . EYE SURGERY Bilateral    cataract removed  . JOINT REPLACEMENT Right 2006   knee  . PAROTIDECTOMY Left   . TOTAL KNEE ARTHROPLASTY Left 03/13/2014   Procedure: LEFT TOTAL KNEE ARTHROPLASTY;  Surgeon: Kerin Salen, MD;  Location: [facility];  Service: Orthopedics;  Laterality: Left;    FAMILY HISTORY Family History  Problem Relation Age of Onset  . Heart disease Mother   . Emphysema Father   The patient's father died at the age of 28 from emphysema. The patient's mother died from "old age" at 63. The patient  had one brother, 3 sisters. There is no history of breast cancer in the family. One sister had "a female cancer", but the patient is not sure what it may have been.  GYNECOLOGIC HISTORY:  No LMP recorded. Patient has had a  hysterectomy. The patient is status post total abdominal hysterectomy with bilateral salpingo-oophorectomy remotely. She is GX P2, first live birth age 44. She never used oral contraceptives.  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 Her husband [name] has been a Psychologist, sport and exercise. They currently have 41 acres. Son [name], 56, lives in [place] and works for [employer]. Son [name] lives in [place] and works for [employer]. The patient has 2 grandchildren and some great-grandchildren. She is a Psychologist, forensic.    ADVANCED DIRECTIVES: In place   HEALTH MAINTENANCE: Social History  Substance Use Topics  . Smoking status: Former Research scientist (life sciences)  . Smokeless tobacco: Never Used  . Alcohol use No     Colonoscopy: Repeat due 2019/[facility]  PAP:  Bone density: 04/20/2013, T score -2.3   Allergies  Allergen Reactions  . Hyoscyamine Sulfate     REACTION: rash  . Norvasc [Amlodipine Besylate] Swelling  . Phenazopyridine Hcl Rash  . Sulfonamide Derivatives Rash    Current Outpatient Prescriptions  Medication Sig Dispense Refill  . cloNIDine (CATAPRES) 0.1 MG tablet Take 1 tablet (0.1 mg total) by mouth 2 (two) times daily. 180 tablet 1  . FIBER PO Take 2 each by mouth daily.     Marland Kitchen levothyroxine (SYNTHROID, LEVOTHROID) 75 MCG tablet Take 1 tablet (75 mcg total) by mouth daily. 90 tablet 1  . olmesartan-hydrochlorothiazide (BENICAR HCT) 40-25 MG tablet Take 1 tablet by mouth daily. 30 tablet 5  . omeprazole (PRILOSEC) 20 MG capsule Take 1 capsule (20 mg total) by mouth daily. 90 capsule 1  . Cholecalciferol (VITAMIN D) 2000 UNITS CAPS Take by mouth daily.    . fish oil-omega-3 fatty acids 1000 MG capsule Take 1 g by mouth daily.    . Multiple Vitamins-Minerals (ICAPS PO) Take 1 tablet by mouth daily.     . vitamin C (ASCORBIC ACID) 500 MG tablet Take 500 mg by mouth daily.     No current facility-administered medications for this visit.     OBJECTIVE: Elderly  white woman who appears stated age 83:   10/06/16 1257  BP: (!) 165/84  Pulse: 60  Resp: 18  Temp: 97.4 F (36.3 C)     Body mass index is 32.63 kg/m.    ECOG FS:0 - Asymptomatic  Ocular: Sclerae unicteric, pupils equal, round and reactive to light Ear-nose-throat: Oropharynx clear and moist Lymphatic: No cervical or supraclavicular adenopathy Lungs no rales or rhonchi, good excursion bilaterally Heart regular rate and rhythm, no murmur appreciated Abd soft, nontender, positive bowel sounds MSK no focal spinal tenderness, no joint edema Neuro: non-focal, well-oriented, appropriate affect Breasts: I do not palpate a mass in either breast. Both axillae are benign.   LAB RESULTS:  CMP     Component Value Date/Time   NA 140 10/06/2016 1232   K 4.5 10/06/2016 1232   CL 101 09/28/2016 0915   CO2 28 10/06/2016 1232   GLUCOSE 116 10/06/2016 1232   BUN 36.2 (H) 10/06/2016 1232   CREATININE 1.9 (H) 10/06/2016 1232   CALCIUM 10.1 10/06/2016 1232   PROT 7.3 10/06/2016 1232   ALBUMIN 3.8 10/06/2016 1232   AST 17 10/06/2016 1232   ALT 13 10/06/2016 1232  ALKPHOS 100 10/06/2016 1232   BILITOT 0.40 10/06/2016 1232   GFRNONAA 27 (L) 09/28/2016 0915   GFRNONAA 49 (L) 11/24/2012 0823   GFRAA 31 (L) 09/28/2016 0915   GFRAA 56 (L) 11/24/2012 0823    No results found for: TOTALPROTELP, ALBUMINELP, A1GS, A2GS, BETS, BETA2SER, GAMS, MSPIKE, SPEI  No results found for: Nils Pyle, [facility]  Lab Results  Component Value Date   WBC 7.0 10/06/2016   NEUTROABS 4.5 10/06/2016   HGB 12.5 10/06/2016   HCT 37.4 10/06/2016   MCV 93.9 10/06/2016   PLT 186 10/06/2016      Chemistry      Component Value Date/Time   NA 140 10/06/2016 1232   K 4.5 10/06/2016 1232   CL 101 09/28/2016 0915   CO2 28 10/06/2016 1232   BUN 36.2 (H) 10/06/2016 1232   CREATININE 1.9 (H) 10/06/2016 1232      Component Value Date/Time   CALCIUM 10.1 10/06/2016 1232   ALKPHOS 100  10/06/2016 1232   AST 17 10/06/2016 1232   ALT 13 10/06/2016 1232   BILITOT 0.40 10/06/2016 1232       No results found for: LABCA2  No components found for: [id number]  No results for input(s): INR in the last 168 hours.  Urinalysis    Component Value Date/Time   APPEARANCEUR Clear 11/27/2015 1132   GLUCOSEU Negative 11/27/2015 1132   BILIRUBINUR Negative 11/27/2015 1132   PROTEINUR Negative 11/27/2015 1132   NITRITE Negative 11/27/2015 1132   LEUKOCYTESUR 1+ (A) 11/27/2015 1132     STUDIES: US Breast Ltd Uni Left Inc Axilla  Result Date: 09/10/2016 CLINICAL DATA:  Possible left breast asymmetry on a recent 2D screening mammogram. EXAM: 2D DIGITAL DIAGNOSTIC LEFT MAMMOGRAM WITH CAD AND ADJUNCT TOMO ULTRASOUND LEFT BREAST COMPARISON:  Previous exam(s). ACR Breast Density Category c: The breast tissue is heterogeneously dense, which may obscure small masses. FINDINGS: 2D and 3D tomographic views of the left breast were obtained. These demonstrate an area of architectural distortion in the upper-outer quadrant of the breast. No findings elsewhere in the left breast suspicious for malignancy. Mammographic images were processed with CAD. On physical exam, no discrete mass is palpable in the upper outer left breast or left axilla. Targeted ultrasound is performed, showing a 9 x 7 x 5 mm vertically oriented, irregular hypoechoic mass with poorly defined margins in the 1:30 o'clock position of the left breast, 3 cm from the nipple. This exhibits posterior acoustical shadowing. Ultrasound of the left axilla demonstrates a single left axillary lymph node with mild, irregular, eccentric cortical thickening. This node measures 6 mm in maximum diameter with a maximum cortical thickness of 4 mm. The remainder of the left axillary lymph nodes have normal appearances. IMPRESSION: 1. 9 x 7 x 5 mm mass in the 1:30 o'clock position of the left breast with associated architectural distortion. This has  imaging features suspicious for malignancy. 2. Single left axillary lymph node with mild eccentric, irregular cortical thickening, suspicious for a metastatic lymph node. RECOMMENDATION: Ultrasound-guided core needle biopsy of the 9 mm mass in the 1:30 o'clock position of the left breast and ultrasound-guided core needle biopsy of the suspicious left axillary lymph node. This has been discussed with the patient, her husband and her son and scheduled at 1:45 p.m. on 09/24/2016. I have discussed the findings and recommendations with the patient. Results were also provided in writing at the conclusion of the visit. If applicable, a reminder letter will be sent to the  patient regarding the next appointment. BI-RADS CATEGORY  4: Suspicious. Electronically Signed   By: Claudie Revering M.D.   On: 09/10/2016 12:11   Mm Diag Breast Tomo Uni Left  Result Date: 09/10/2016 CLINICAL DATA:  Possible left breast asymmetry on a recent 2D screening mammogram. EXAM: 2D DIGITAL DIAGNOSTIC LEFT MAMMOGRAM WITH CAD AND ADJUNCT TOMO ULTRASOUND LEFT BREAST COMPARISON:  Previous exam(s). ACR Breast Density Category c: The breast tissue is heterogeneously dense, which may obscure small masses. FINDINGS: 2D and 3D tomographic views of the left breast were obtained. These demonstrate an area of architectural distortion in the upper-outer quadrant of the breast. No findings elsewhere in the left breast suspicious for malignancy. Mammographic images were processed with CAD. On physical exam, no discrete mass is palpable in the upper outer left breast or left axilla. Targeted ultrasound is performed, showing a 9 x 7 x 5 mm vertically oriented, irregular hypoechoic mass with poorly defined margins in the 1:30 o'clock position of the left breast, 3 cm from the nipple. This exhibits posterior acoustical shadowing. Ultrasound of the left axilla demonstrates a single left axillary lymph node with mild, irregular, eccentric cortical thickening. This  node measures 6 mm in maximum diameter with a maximum cortical thickness of 4 mm. The remainder of the left axillary lymph nodes have normal appearances. IMPRESSION: 1. 9 x 7 x 5 mm mass in the 1:30 o'clock position of the left breast with associated architectural distortion. This has imaging features suspicious for malignancy. 2. Single left axillary lymph node with mild eccentric, irregular cortical thickening, suspicious for a metastatic lymph node. RECOMMENDATION: Ultrasound-guided core needle biopsy of the 9 mm mass in the 1:30 o'clock position of the left breast and ultrasound-guided core needle biopsy of the suspicious left axillary lymph node. This has been discussed with the patient, her husband and her son and scheduled at 1:45 p.m. on 09/24/2016. I have discussed the findings and recommendations with the patient. Results were also provided in writing at the conclusion of the visit. If applicable, a reminder letter will be sent to the patient regarding the next appointment. BI-RADS CATEGORY  4: Suspicious. Electronically Signed   By: Claudie Revering M.D.   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 This was found to be concordant by Dr. Peggye Fothergill. Pathology results were discussed with the patient by telephone. The patient reported doing well after the biopsies with tenderness at the sites. Post biopsy instructions and care were reviewed and questions were answered. The patient was encouraged to call [facility] for any additional concerns. The patient was referred to the [facility] at the [facility] on Oct 06, 2016. Pathology results reported by Susa Raring RN, BSN on  09/27/2016. Electronically Signed   By: Evangeline Dakin M.D.   On: 09/27/2016 11:15   Result Date: 09/27/2016 CLINICAL DATA:  83 year old presenting with a screening detected 9 mm mass in the upper outer quadrant of the left breast at the 1:30 o'clock position approximately 3 cm from the nipple. A solitary pathologic left axillary lymph node with cortical thickening up to approximately 6 mm was also identified at the time of diagnostic workup. EXAM: ULTRASOUND GUIDED CORE NEEDLE BIOPSY OF A  LEFT AXILLARY NODE COMPARISON:  Previous exam(s). FINDINGS: I met with the patient and we discussed the procedure of ultrasound-guided biopsy, including benefits and alternatives. We discussed the high likelihood of a successful procedure. We discussed the risks of the procedure, including infection, bleeding, tissue injury, clip migration, and inadequate sampling. Informed written consent was given. The usual time-out protocol was performed immediately prior to the procedure. Using sterile technique with chlorhexidine and skin antisepsis, 1% Lidocaine as local anesthetic, under direct ultrasound visualization, a 14 gauge Bard Marquee core needle device was used to perform biopsy of the solitary pathologic left axillary lymph using an inferolateral approach. At the conclusion of the procedure a HydroMark spiral shaped tissue marker clip was deployed into the biopsy cavity. Follow up 2 view mammogram was performed and dictated separately. IMPRESSION: Ultrasound guided biopsy of a solitary level 1 pathologic left axillary lymph node. No apparent complications. Electronically Signed: By: Evangeline Dakin M.D. On: 09/24/2016 15:01   Mm Clip Placement Left  Result Date: 09/24/2016 CLINICAL DATA:  Confirmation clip placement after ultrasound-guided core needle biopsy of a mass in the upper outer quadrant of the left breast. A solitary pathologic left axillary lymph node was also biopsied. EXAM: DIAGNOSTIC LEFT MAMMOGRAM POST  ULTRASOUND BIOPSY COMPARISON:  Previous exam(s). FINDINGS: Mammographic images were obtained following ultrasound guided biopsy of a mass in the upper outer quadrant of the left breast at the 1:30 o'clock position approximately 3 cm from the nipple. The ribbon shaped tissue marker clip is appropriately positioned immediately adjacent to the biopsied mass in the upper outer quadrant. The [facility] spiral shaped tissue marker clip placed within the biopsied lymph node in the left axilla is not visible on mammography. Expected post biopsy changes are present without evidence of hematoma. IMPRESSION: Appropriate positioning of the ribbon shaped tissue marker clip immediately adjacent to the biopsied mass in the upper outer quadrant of the left breast. Final Assessment: Post Procedure Mammograms for Marker Placement Electronically Signed   By: Evangeline Dakin M.D.   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 This was found to be concordant by Dr. Peggye Fothergill. Pathology results were discussed with the patient by telephone. The patient reported doing well after the biopsies with tenderness at the sites. Post biopsy instructions and care were reviewed and questions were answered. The patient was encouraged to call [facility] for any additional concerns. The patient was referred to the [facility] at the [facility] on Oct 06, 2016. Pathology results reported by Susa Raring RN, BSN on 09/27/2016. Electronically Signed   By: Evangeline Dakin M.D.   On: 09/27/2016 11:14   Result Date: 09/27/2016 CLINICAL DATA:  83 year old presenting with a screening detected 9 mm mass in  the upper outer quadrant of the left breast at the 1:30 o'clock position approximately 3 cm from the nipple. A solitary pathologic left axillary lymph node with cortical thickening up to approximately 6 mm was also identified at the time of diagnostic workup. EXAM: ULTRASOUND GUIDED LEFT BREAST CORE NEEDLE BIOPSY COMPARISON:  Previous exam(s). FINDINGS: I met with the patient and we discussed the procedure of ultrasound-guided biopsy, including benefits and alternatives. We  discussed the high likelihood of a successful procedure. We discussed the risks of the procedure, including infection, bleeding, tissue injury, clip migration, and inadequate sampling. Informed written consent was given. The usual time-out protocol was performed immediately prior to the procedure. Lesion quadrant: Upper outer quadrant. Using sterile technique with chlorhexidine as skin antisepsis, 1% Lidocaine as local anesthetic, under direct ultrasound visualization, a 12 gauge Bard marking core needle device was used to perform biopsy of a suspicious 9 mm mass in the upper outer quadrant of the left breast using a lateral approach. At the conclusion of the procedure a ribbon shaped tissue marker clip was deployed into the biopsy cavity. Follow up 2 view mammogram was performed and dictated separately. IMPRESSION: Ultrasound guided biopsy of a 9 mm mass in the upper outer quadrant of the left breast. No apparent complications. Electronically Signed: By: Evangeline Dakin M.D. On: 09/24/2016 15:00    ELIGIBLE FOR AVAILABLE RESEARCH PROTOCOL: no  ASSESSMENT: 83 y.o. [place], [place] woman status post left breast upper outer quadrant biopsy 09/24/2016 for a clinical T1B N0, stage IA invasive ductal carcinoma, grade 2, estrogen and progesterone receptor positive, HER-2 not amplified, with an MIB-1 of 5%  (1) breast conserving surgery with no sentinel lymph node planned  (2) adjuvant radiation may be omitted if the patient is agreeable to  anti-estrogen therapy  (3) anti-estrogens to be continued for total of 5 years.  (a) bone density December 2014 shows a T score of -2.3  PLAN: We spent the better part of today's hour-long appointment discussing the biology of breast cancer in general, and the specifics of the patient's tumor in particular. We first reviewed the fact that cancer is not one disease but more than 100 different diseases and that it is important to keep them separate-- otherwise when friends and relatives discuss their own cancer experiences with Dannelle confusion can result. Similarly we explained that if breast cancer spreads to the bone or liver, the patient would not have bone cancer or liver cancer, but breast cancer in the bone and breast cancer in the liver: one cancer in three places-- not 3 different cancers which otherwise would have to be treated in 3 different ways.  We discussed the difference between local and systemic therapy. In terms of loco-regional treatment, lumpectomy plus radiation is equivalent to mastectomy as far as survival is concerned. For this reason, and because the cosmetic results are generally superior, we recommend breast conserving surgery. In her case, given her age and the prognostic profile results we do not feel sentinel lymph node sampling would affect treatment choices. If the patient is able to tolerate anti-estrogens we also feel adjuvant radiation may be omitted safely  We then discussed the rationale for systemic therapy. There is some risk that this cancer may have already spread to other parts of her body. Patients frequently ask at this point about bone scans, CAT scans and PET scans to find out if they have occult breast cancer somewhere else. The problem is that in early stage disease we are much more likely to find false positives then true cancers and this would expose the patient to unnecessary procedures as well as unnecessary radiation. Scans cannot answer the question the  patient really would like to know, which is whether she has microscopic disease elsewhere in her body. For those reasons we do not recommend them.  Of course we would proceed to aggressive evaluation of any symptoms that might suggest metastatic disease, but that is  not the case here.  Next we went over the options for systemic therapy which are anti-estrogens, anti-HER-2 immunotherapy, and chemotherapy. Marylene does not meet criteria for anti-HER-2 immunotherapy. She is a good candidate for anti-estrogens.  The question of chemotherapy was also discussed. Chemotherapy is most effective in rapidly growing, aggressive tumors. It is much less effective in not very aggressive, slow growing cancers, like Darlys 's. For that reason and given the patient's age we will forego Oncotype testing as suggested by NCCN guidelines. Accordingly the plan will be for her surgery followed by anti-estrogens.  Given the patient's prior bone density, which showed near osteoporosis 4 years ago, a vigorous osteoporosis prevention program will also be initiated.  Alazia has a good understanding of the overall plan. She agrees with it. She knows the goal of treatment in her case is cure. She will call with any problems that may develop before her next visit here.  Chauncey Cruel, MD   10/07/2016 6:49 AM Medical Oncology and Hematology [facility] [address] [place], [place] [address] Tel. [phone]    Fax. [phone]

## 2016-10-08 DIAGNOSIS — C801 Malignant (primary) neoplasm, unspecified: Secondary | ICD-10-CM

## 2016-10-08 HISTORY — DX: Malignant (primary) neoplasm, unspecified: C80.1

## 2016-10-11 ENCOUNTER — Telehealth: Payer: Self-pay | Admitting: *Deleted

## 2016-10-11 NOTE — Telephone Encounter (Signed)
Spoke to pt concerning [place] from 5.30.18. Denies question or concerns regarding dx or treatment care plan. Encourage pt to call with needs. Received verbal understanding.

## 2016-10-13 ENCOUNTER — Ambulatory Visit: Payer: Medicare Other | Admitting: Oncology

## 2016-10-20 ENCOUNTER — Other Ambulatory Visit: Payer: Self-pay | Admitting: General Surgery

## 2016-10-20 DIAGNOSIS — C50412 Malignant neoplasm of upper-outer quadrant of left female breast: Secondary | ICD-10-CM

## 2016-10-20 DIAGNOSIS — Z170 Estrogen receptor positive status [ER+]: Principal | ICD-10-CM

## 2016-10-25 ENCOUNTER — Encounter (HOSPITAL_BASED_OUTPATIENT_CLINIC_OR_DEPARTMENT_OTHER): Payer: Self-pay | Admitting: *Deleted

## 2016-10-25 NOTE — Progress Notes (Signed)
Dr Kalman Shan made aware of patient's hx of difficult airway. States he will consult with Dr Lissa Hoard or Dr Gifford Shave also.

## 2016-10-27 ENCOUNTER — Encounter: Payer: Self-pay | Admitting: *Deleted

## 2016-10-28 ENCOUNTER — Other Ambulatory Visit: Payer: Self-pay

## 2016-10-28 ENCOUNTER — Encounter (HOSPITAL_BASED_OUTPATIENT_CLINIC_OR_DEPARTMENT_OTHER)
Admission: RE | Admit: 2016-10-28 | Discharge: 2016-10-28 | Disposition: A | Discharge status: Home / Self Care | Payer: Medicare Other | Source: Ambulatory Visit | Attending: General Surgery | Admitting: General Surgery

## 2016-10-28 ENCOUNTER — Ambulatory Visit
Admission: RE | Admit: 2016-10-28 | Discharge: 2016-10-28 | Disposition: A | Discharge status: Home / Self Care | Payer: Medicare Other | Source: Ambulatory Visit | Attending: General Surgery | Admitting: General Surgery

## 2016-10-28 DIAGNOSIS — Z79899 Other long term (current) drug therapy: Secondary | ICD-10-CM | POA: Diagnosis not present

## 2016-10-28 DIAGNOSIS — K219 Gastro-esophageal reflux disease without esophagitis: Secondary | ICD-10-CM | POA: Diagnosis not present

## 2016-10-28 DIAGNOSIS — Z170 Estrogen receptor positive status [ER+]: Principal | ICD-10-CM

## 2016-10-28 DIAGNOSIS — Z87891 Personal history of nicotine dependence: Secondary | ICD-10-CM | POA: Diagnosis not present

## 2016-10-28 DIAGNOSIS — E039 Hypothyroidism, unspecified: Secondary | ICD-10-CM | POA: Diagnosis not present

## 2016-10-28 DIAGNOSIS — C50912 Malignant neoplasm of unspecified site of left female breast: Secondary | ICD-10-CM | POA: Diagnosis present

## 2016-10-28 DIAGNOSIS — E079 Disorder of thyroid, unspecified: Secondary | ICD-10-CM | POA: Diagnosis not present

## 2016-10-28 DIAGNOSIS — D0512 Intraductal carcinoma in situ of left breast: Secondary | ICD-10-CM | POA: Diagnosis not present

## 2016-10-28 DIAGNOSIS — C50412 Malignant neoplasm of upper-outer quadrant of left female breast: Secondary | ICD-10-CM

## 2016-10-28 DIAGNOSIS — F419 Anxiety disorder, unspecified: Secondary | ICD-10-CM | POA: Diagnosis not present

## 2016-10-28 DIAGNOSIS — M1990 Unspecified osteoarthritis, unspecified site: Secondary | ICD-10-CM | POA: Diagnosis not present

## 2016-10-28 DIAGNOSIS — I10 Essential (primary) hypertension: Secondary | ICD-10-CM | POA: Diagnosis not present

## 2016-10-28 DIAGNOSIS — D649 Anemia, unspecified: Secondary | ICD-10-CM | POA: Diagnosis not present

## 2016-10-28 DIAGNOSIS — G709 Myoneural disorder, unspecified: Secondary | ICD-10-CM | POA: Diagnosis not present

## 2016-10-28 NOTE — Progress Notes (Signed)
Pt given Boost drink, instructed to drink by 0400 day of surgery with teach back method. Dr. Sabra Heck reviewed EKG - ok for surgery

## 2016-10-29 ENCOUNTER — Ambulatory Visit (HOSPITAL_BASED_OUTPATIENT_CLINIC_OR_DEPARTMENT_OTHER)
Admission: RE | Admit: 2016-10-29 | Discharge: 2016-10-29 | Disposition: A | Discharge status: Home / Self Care | Payer: Medicare Other | Source: Ambulatory Visit | Attending: General Surgery | Admitting: General Surgery

## 2016-10-29 ENCOUNTER — Encounter (HOSPITAL_BASED_OUTPATIENT_CLINIC_OR_DEPARTMENT_OTHER)
Admission: RE | Disposition: A | Discharge status: Home / Self Care | Payer: Self-pay | Source: Ambulatory Visit | Attending: General Surgery

## 2016-10-29 ENCOUNTER — Ambulatory Visit
Admission: RE | Admit: 2016-10-29 | Discharge: 2016-10-29 | Disposition: A | Discharge status: Home / Self Care | Payer: Medicare Other | Source: Ambulatory Visit | Attending: General Surgery | Admitting: General Surgery

## 2016-10-29 ENCOUNTER — Ambulatory Visit (HOSPITAL_BASED_OUTPATIENT_CLINIC_OR_DEPARTMENT_OTHER): Payer: Medicare Other | Admitting: Certified Registered"

## 2016-10-29 ENCOUNTER — Encounter (HOSPITAL_BASED_OUTPATIENT_CLINIC_OR_DEPARTMENT_OTHER): Payer: Self-pay | Admitting: Certified Registered"

## 2016-10-29 DIAGNOSIS — Z170 Estrogen receptor positive status [ER+]: Secondary | ICD-10-CM | POA: Insufficient documentation

## 2016-10-29 DIAGNOSIS — E079 Disorder of thyroid, unspecified: Secondary | ICD-10-CM | POA: Insufficient documentation

## 2016-10-29 DIAGNOSIS — C50412 Malignant neoplasm of upper-outer quadrant of left female breast: Secondary | ICD-10-CM

## 2016-10-29 DIAGNOSIS — D649 Anemia, unspecified: Secondary | ICD-10-CM | POA: Insufficient documentation

## 2016-10-29 DIAGNOSIS — K219 Gastro-esophageal reflux disease without esophagitis: Secondary | ICD-10-CM | POA: Diagnosis not present

## 2016-10-29 DIAGNOSIS — I10 Essential (primary) hypertension: Secondary | ICD-10-CM | POA: Insufficient documentation

## 2016-10-29 DIAGNOSIS — D0512 Intraductal carcinoma in situ of left breast: Secondary | ICD-10-CM | POA: Insufficient documentation

## 2016-10-29 DIAGNOSIS — F419 Anxiety disorder, unspecified: Secondary | ICD-10-CM | POA: Insufficient documentation

## 2016-10-29 DIAGNOSIS — Z79899 Other long term (current) drug therapy: Secondary | ICD-10-CM | POA: Insufficient documentation

## 2016-10-29 DIAGNOSIS — E039 Hypothyroidism, unspecified: Secondary | ICD-10-CM | POA: Insufficient documentation

## 2016-10-29 DIAGNOSIS — G709 Myoneural disorder, unspecified: Secondary | ICD-10-CM | POA: Insufficient documentation

## 2016-10-29 DIAGNOSIS — M1990 Unspecified osteoarthritis, unspecified site: Secondary | ICD-10-CM | POA: Diagnosis not present

## 2016-10-29 DIAGNOSIS — Z87891 Personal history of nicotine dependence: Secondary | ICD-10-CM | POA: Insufficient documentation

## 2016-10-29 HISTORY — DX: Other specified postprocedural states: Z98.890

## 2016-10-29 HISTORY — DX: Malignant (primary) neoplasm, unspecified: C80.1

## 2016-10-29 HISTORY — DX: Nausea with vomiting, unspecified: R11.2

## 2016-10-29 HISTORY — PX: BREAST LUMPECTOMY WITH RADIOACTIVE SEED LOCALIZATION: SHX6424

## 2016-10-29 SURGERY — BREAST LUMPECTOMY WITH RADIOACTIVE SEED LOCALIZATION
Anesthesia: General | Site: Breast | Laterality: Left

## 2016-10-29 MED ORDER — ONDANSETRON HCL 4 MG/2ML IJ SOLN
INTRAMUSCULAR | Status: AC
  Filled 2016-10-29: qty 2

## 2016-10-29 MED ORDER — TRAMADOL HCL 50 MG PO TABS: 50.0000 mg | ORAL_TABLET | Freq: Four times a day (QID) | ORAL | 1 refills | Status: DC | PRN

## 2016-10-29 MED ORDER — GLYCOPYRROLATE 0.2 MG/ML IJ SOLN
INTRAMUSCULAR | Status: DC | PRN
  Administered 2016-10-29: 0.1 mg via INTRAVENOUS

## 2016-10-29 MED ORDER — PROPOFOL 10 MG/ML IV BOLUS
INTRAVENOUS | Status: AC
  Filled 2016-10-29: qty 20

## 2016-10-29 MED ORDER — DEXAMETHASONE SODIUM PHOSPHATE 4 MG/ML IJ SOLN
INTRAMUSCULAR | Status: DC | PRN
  Administered 2016-10-29: 10 mg via INTRAVENOUS

## 2016-10-29 MED ORDER — LACTATED RINGERS IV SOLN
INTRAVENOUS | Status: DC
  Administered 2016-10-29: 07:00:00 via INTRAVENOUS

## 2016-10-29 MED ORDER — BUPIVACAINE-EPINEPHRINE (PF) 0.25% -1:200000 IJ SOLN
INTRAMUSCULAR | Status: AC
  Filled 2016-10-29: qty 30

## 2016-10-29 MED ORDER — FENTANYL CITRATE (PF) 100 MCG/2ML IJ SOLN
50.0000 ug | INTRAMUSCULAR | Status: DC | PRN
  Administered 2016-10-29: 25 ug via INTRAVENOUS
  Administered 2016-10-29: 50 ug via INTRAVENOUS

## 2016-10-29 MED ORDER — PROPOFOL 10 MG/ML IV BOLUS
INTRAVENOUS | Status: DC | PRN
  Administered 2016-10-29: 150 mg via INTRAVENOUS

## 2016-10-29 MED ORDER — DEXAMETHASONE SODIUM PHOSPHATE 10 MG/ML IJ SOLN
INTRAMUSCULAR | Status: AC
  Filled 2016-10-29: qty 1

## 2016-10-29 MED ORDER — ACETAMINOPHEN 500 MG PO TABS
ORAL_TABLET | ORAL | Status: AC
  Filled 2016-10-29: qty 2

## 2016-10-29 MED ORDER — EPHEDRINE SULFATE 50 MG/ML IJ SOLN
INTRAMUSCULAR | Status: DC | PRN
  Administered 2016-10-29 (×2): 10 mg via INTRAVENOUS

## 2016-10-29 MED ORDER — CEFAZOLIN SODIUM-DEXTROSE 2-4 GM/100ML-% IV SOLN
2.0000 g | INTRAVENOUS | Status: AC
  Administered 2016-10-29: 2 g via INTRAVENOUS

## 2016-10-29 MED ORDER — BUPIVACAINE HCL (PF) 0.5 % IJ SOLN
INTRAMUSCULAR | Status: AC
  Filled 2016-10-29: qty 30

## 2016-10-29 MED ORDER — BUPIVACAINE-EPINEPHRINE (PF) 0.5% -1:200000 IJ SOLN
INTRAMUSCULAR | Status: AC
  Filled 2016-10-29: qty 30

## 2016-10-29 MED ORDER — ONDANSETRON HCL 4 MG/2ML IJ SOLN
INTRAMUSCULAR | Status: DC | PRN
  Administered 2016-10-29: 4 mg via INTRAVENOUS

## 2016-10-29 MED ORDER — CHLORHEXIDINE GLUCONATE CLOTH 2 % EX PADS: 6.0000 | MEDICATED_PAD | Freq: Once | CUTANEOUS | Status: DC

## 2016-10-29 MED ORDER — LIDOCAINE HCL (CARDIAC) 20 MG/ML IV SOLN
INTRAVENOUS | Status: DC | PRN
  Administered 2016-10-29: 60 mg via INTRAVENOUS

## 2016-10-29 MED ORDER — CEFAZOLIN SODIUM-DEXTROSE 2-4 GM/100ML-% IV SOLN
INTRAVENOUS | Status: AC
  Filled 2016-10-29: qty 100

## 2016-10-29 MED ORDER — LIDOCAINE 2% (20 MG/ML) 5 ML SYRINGE
INTRAMUSCULAR | Status: AC
  Filled 2016-10-29: qty 5

## 2016-10-29 MED ORDER — GABAPENTIN 300 MG PO CAPS
ORAL_CAPSULE | ORAL | Status: AC
  Filled 2016-10-29: qty 1

## 2016-10-29 MED ORDER — FENTANYL CITRATE (PF) 100 MCG/2ML IJ SOLN
INTRAMUSCULAR | Status: AC
  Filled 2016-10-29: qty 2

## 2016-10-29 MED ORDER — SCOPOLAMINE 1 MG/3DAYS TD PT72: 1.0000 | MEDICATED_PATCH | Freq: Once | TRANSDERMAL | Status: DC | PRN

## 2016-10-29 MED ORDER — MIDAZOLAM HCL 2 MG/2ML IJ SOLN: 1.0000 mg | INTRAMUSCULAR | Status: DC | PRN

## 2016-10-29 MED ORDER — ACETAMINOPHEN 500 MG PO TABS
1000.0000 mg | ORAL_TABLET | ORAL | Status: AC
  Administered 2016-10-29: 1000 mg via ORAL

## 2016-10-29 MED ORDER — BUPIVACAINE-EPINEPHRINE (PF) 0.25% -1:200000 IJ SOLN
INTRAMUSCULAR | Status: DC | PRN
  Administered 2016-10-29: 20 mL

## 2016-10-29 MED ORDER — GABAPENTIN 300 MG PO CAPS
300.0000 mg | ORAL_CAPSULE | ORAL | Status: AC
  Administered 2016-10-29: 300 mg via ORAL

## 2016-10-29 SURGICAL SUPPLY — 51 items
BINDER BREAST LRG (GAUZE/BANDAGES/DRESSINGS) IMPLANT
BINDER BREAST MEDIUM (GAUZE/BANDAGES/DRESSINGS) IMPLANT
BINDER BREAST XLRG (GAUZE/BANDAGES/DRESSINGS) ×3 IMPLANT
BINDER BREAST XXLRG (GAUZE/BANDAGES/DRESSINGS) IMPLANT
BLADE SURG 15 STRL LF DISP TIS (BLADE) ×1 IMPLANT
BLADE SURG 15 STRL SS (BLADE) ×2
CANISTER SUC SOCK COL 7IN (MISCELLANEOUS) IMPLANT
CANISTER SUCT 1200ML W/VALVE (MISCELLANEOUS) IMPLANT
CHLORAPREP W/TINT 26ML (MISCELLANEOUS) ×3 IMPLANT
CLIP TI WIDE RED SMALL 6 (CLIP) ×3 IMPLANT
COVER BACK TABLE 60X90IN (DRAPES) ×3 IMPLANT
COVER MAYO STAND STRL (DRAPES) ×3 IMPLANT
COVER PROBE W GEL 5X96 (DRAPES) ×3 IMPLANT
DECANTER SPIKE VIAL GLASS SM (MISCELLANEOUS) IMPLANT
DERMABOND ADVANCED (GAUZE/BANDAGES/DRESSINGS) ×2
DERMABOND ADVANCED .7 DNX12 (GAUZE/BANDAGES/DRESSINGS) ×1 IMPLANT
DEVICE DUBIN W/COMP PLATE 8390 (MISCELLANEOUS) ×3 IMPLANT
DRAPE LAPAROSCOPIC ABDOMINAL (DRAPES) ×3 IMPLANT
DRAPE UTILITY XL STRL (DRAPES) ×3 IMPLANT
DRSG PAD ABDOMINAL 8X10 ST (GAUZE/BANDAGES/DRESSINGS) ×3 IMPLANT
ELECT COATED BLADE 2.86 ST (ELECTRODE) ×3 IMPLANT
ELECT REM PT RETURN 9FT ADLT (ELECTROSURGICAL) ×3
ELECTRODE REM PT RTRN 9FT ADLT (ELECTROSURGICAL) ×1 IMPLANT
GLOVE BIO SURGEON STRL SZ7 (GLOVE) ×6 IMPLANT
GLOVE BIOGEL PI IND STRL 7.0 (GLOVE) ×1 IMPLANT
GLOVE BIOGEL PI IND STRL 8 (GLOVE) ×1 IMPLANT
GLOVE BIOGEL PI INDICATOR 7.0 (GLOVE) ×2
GLOVE BIOGEL PI INDICATOR 8 (GLOVE) ×2
GLOVE ECLIPSE 7.5 STRL STRAW (GLOVE) ×6 IMPLANT
GLOVE SURG SS PI 7.0 STRL IVOR (GLOVE) ×3 IMPLANT
GOWN STRL REUS W/ TWL LRG LVL3 (GOWN DISPOSABLE) ×1 IMPLANT
GOWN STRL REUS W/ TWL XL LVL3 (GOWN DISPOSABLE) ×2 IMPLANT
GOWN STRL REUS W/TWL LRG LVL3 (GOWN DISPOSABLE) ×2
GOWN STRL REUS W/TWL XL LVL3 (GOWN DISPOSABLE) ×4
ILLUMINATOR WAVEGUIDE N/F (MISCELLANEOUS) IMPLANT
KIT MARKER MARGIN INK (KITS) ×3 IMPLANT
LIGHT WAVEGUIDE WIDE FLAT (MISCELLANEOUS) IMPLANT
NEEDLE HYPO 25X1 1.5 SAFETY (NEEDLE) ×3 IMPLANT
NS IRRIG 1000ML POUR BTL (IV SOLUTION) IMPLANT
PACK BASIN DAY SURGERY FS (CUSTOM PROCEDURE TRAY) ×3 IMPLANT
PENCIL BUTTON HOLSTER BLD 10FT (ELECTRODE) ×3 IMPLANT
SLEEVE SCD COMPRESS KNEE MED (MISCELLANEOUS) ×3 IMPLANT
SPONGE LAP 4X18 X RAY DECT (DISPOSABLE) ×3 IMPLANT
SUT MON AB 5-0 PS2 18 (SUTURE) ×3 IMPLANT
SUT VICRYL 3-0 CR8 SH (SUTURE) ×3 IMPLANT
SYR CONTROL 10ML LL (SYRINGE) ×3 IMPLANT
TOWEL OR 17X24 6PK STRL BLUE (TOWEL DISPOSABLE) ×3 IMPLANT
TOWEL OR NON WOVEN STRL DISP B (DISPOSABLE) ×3 IMPLANT
TUBE CONNECTING 20'X1/4 (TUBING)
TUBE CONNECTING 20X1/4 (TUBING) IMPLANT
YANKAUER SUCT BULB TIP NO VENT (SUCTIONS) IMPLANT

## 2016-10-29 NOTE — Anesthesia Procedure Notes (Signed)
Procedure Name: LMA Insertion Date/Time: 10/29/2016 7:42 AM Performed by: Alexcis Bicking D Pre-anesthesia Checklist: Patient identified, Emergency Drugs available, Suction available and Patient being monitored Patient Re-evaluated:Patient Re-evaluated prior to inductionOxygen Delivery Method: Circle system utilized Preoxygenation: Pre-oxygenation with 100% oxygen Intubation Type: IV induction Ventilation: Mask ventilation without difficulty LMA: LMA inserted LMA Size: 3.0 Number of attempts: 1 Airway Equipment and Method: Bite block Placement Confirmation: positive ETCO2 Tube secured with: Tape Dental Injury: Teeth and Oropharynx as per pre-operative assessment  Difficulty Due To: Difficulty was anticipated Comments: 1 finger or less TM distance , small mouth opening, native teeth

## 2016-10-29 NOTE — Anesthesia Preprocedure Evaluation (Addendum)
Anesthesia Evaluation  Patient identified by MRN, date of birth, ID band Patient awake    Reviewed: Allergy & Precautions, NPO status , Patient's Chart, lab work & pertinent test results  History of Anesthesia Complications (+) PONV and DIFFICULT AIRWAY  Airway Mallampati: II  TM Distance: <3 FB Neck ROM: Full  Mouth opening: Limited Mouth Opening  Dental  (+) Teeth Intact   Pulmonary former smoker,    breath sounds clear to auscultation       Cardiovascular hypertension, Pt. on medications  Rhythm:Regular Rate:Normal     Neuro/Psych Anxiety  Neuromuscular disease    GI/Hepatic GERD  Medicated,  Endo/Other  Hypothyroidism   Renal/GU      Musculoskeletal  (+) Arthritis ,   Abdominal   Peds  Hematology  (+) anemia ,   Anesthesia Other Findings   Reproductive/Obstetrics                           Anesthesia Physical Anesthesia Plan  ASA: III  Anesthesia Plan: General   Post-op Pain Management:    Induction: Intravenous  PONV Risk Score and Plan: 3 and Ondansetron, Dexamethasone, Propofol and Midazolam  Airway Management Planned: LMA  Additional Equipment:   Intra-op Plan:   Post-operative Plan: Extubation in OR  Informed Consent: I have reviewed the patients History and Physical, chart, labs and discussed the procedure including the risks, benefits and alternatives for the proposed anesthesia with the patient or authorized representative who has indicated his/her understanding and acceptance.   Dental advisory given  Plan Discussed with: CRNA  Anesthesia Plan Comments:        Anesthesia Quick Evaluation

## 2016-10-29 NOTE — Interval H&P Note (Signed)
History and Physical Interval Note:  10/29/2016 7:31 AM  Darlene Gregory  has presented today for surgery, with the diagnosis of LEFT BREAST CANCER  The various methods of treatment have been discussed with the patient and family. After consideration of risks, benefits and other options for treatment, the patient has consented to  Procedure(s): LEFT BREAST LUMPECTOMY WITH RADIOACTIVE SEED LOCALIZATION (Left) as a surgical intervention .  The patient's history has been reviewed, patient examined, no change in status, stable for surgery.  I have reviewed the patient's chart and labs.  Questions were answered to the patient's satisfaction.     Darlene Gregory T

## 2016-10-29 NOTE — H&P (Signed)
History of Present Illness Darlene Gregory T. Tahni Porchia MD; 10/06/2016 2:34 PM) The patient is a 83 year old female who presents with breast cancer. She is a post menopausal female referred by Dr. Purcell Nails for evaluation of recently diagnosed carcinoma of the left breast. She recently presented for a screening mamogram revealing possible asymmetry in the left breast. Subsequent imaging included diagnostic mamogram showing a persistent area of distortion in the upper outer quadrant of the left breast and ultrasound showing a 9 mm hypoechoic mass at the 1:30 o'clock position 3 cm from the nipple. also noted was a potentially abnormal single left axillary lymph node with cortical thickening. 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 lymph node biopsy was negative for carcinoma She is seen now in breast multidisciplinary clinic for initial treatment planning. She has experienced a questionable palpable mass in the extreme upper outer left breast. She does not have a personal history of any previous breast problems.  Findings at that time were the following: Tumor size: 0.9 cm Tumor grade: 2, Ki-67 5% Estrogen Receptor: 100% positive Progesterone Receptor: 95% positive Her-2 neu: negative Lymph node status: negative    Past Surgical History Darlene Pummel, RN; 10/06/2016 7:38 AM) Breast Biopsy  Bilateral. multiple Carotid Artery Surgery  Left. Cataract Surgery  Bilateral. Knee Surgery  Bilateral. Oral Surgery   Diagnostic Studies History Darlene Pummel, RN; 10/06/2016 7:38 AM) Colonoscopy  1-5 years ago  Medication History Darlene Pummel, RN; 10/06/2016 7:38 AM) Medications Reconciled  Social History Darlene Pummel, RN; 10/06/2016 7:38 AM) No alcohol use  No caffeine use  No drug use  Tobacco use  Former smoker.  Family History Darlene Pummel, RN; 10/06/2016 7:38  AM) Arthritis  Mother. Cancer  Family Members In General. Cervical Cancer  Sister. Diabetes Mellitus  Family Members In General. Hypertension  Family Members In General, Mother, Sister. Ovarian Cancer  Sister. Respiratory Condition  Father. Thyroid problems  Family Members In General, Mother.  Other Problems Darlene Pummel, RN; 10/06/2016 7:38 AM) Arthritis  Breast Cancer  Diverticulosis  Gastroesophageal Reflux Disease  General anesthesia - complications  Hemorrhoids  High blood pressure  Lump In Breast  Thyroid Disease     Review of Systems Darlene Spillers Ledford RN; 10/06/2016 7:38 AM) General Not Present- Appetite Loss, Chills, Fatigue, Fever, Night Sweats, Weight Gain and Weight Loss. Skin Not Present- Change in Wart/Mole, Dryness, Hives, Jaundice, New Lesions, Non-Healing Wounds, Rash and Ulcer. HEENT Present- Wears glasses/contact lenses. Not Present- Earache, Hearing Loss, Hoarseness, Nose Bleed, Oral Ulcers, Ringing in the Ears, Seasonal Allergies, Sinus Pain, Sore Throat, Visual Disturbances and Yellow Eyes. Respiratory Present- Snoring. Not Present- Bloody sputum, Chronic Cough, Difficulty Breathing and Wheezing. Breast Present- Breast Mass and Breast Pain. Not Present- Nipple Discharge and Skin Changes. Cardiovascular Present- Swelling of Extremities. Not Present- Chest Pain, Difficulty Breathing Lying Down, Leg Cramps, Palpitations, Rapid Heart Rate and Shortness of Breath. Gastrointestinal Not Present- Abdominal Pain, Bloating, Bloody Stool, Change in Bowel Habits, Chronic diarrhea, Constipation, Difficulty Swallowing, Excessive gas, Gets full quickly at meals, Hemorrhoids, Indigestion, Nausea, Rectal Pain and Vomiting. Musculoskeletal Present- Back Pain. Not Present- Joint Pain, Joint Stiffness, Muscle Pain, Muscle Weakness and Swelling of Extremities. Psychiatric Not Present- Anxiety, Bipolar, Change in Sleep Pattern, Depression, Fearful and Frequent  crying. Endocrine Not Present- Cold Intolerance, Excessive Hunger, Hair Changes, Heat Intolerance, Hot flashes and New Diabetes. Hematology Present- Easy Bruising. Not Present- Blood Thinners, Excessive bleeding, Gland problems,  HIV and Persistent Infections.   Physical Exam Darlene Gregory T. Saran Laviolette MD; 10/06/2016 2:36 PM) The physical exam findings are as follows: Note:General: Alert, well-developed elderly Caucasian female, in no distress Skin: Warm and dry without rash or infection. HEENT: No palpable masses or thyromegaly. Sclera nonicteric. Pupils equal round and reactive. Oropharynx clear. Lymph nodes: No cervical, supraclavicular, nodes palpable. Breasts: some bruising upper outer left breast. I cannot feel any dominant masses. No skin changes, nipple inversion or crusting. Lungs: Breath sounds clear and equal. No wheezing or increased work of breathing. Cardiovascular: Regular rate and rhythm without murmer. 1+ lower extremity edema Abdomen: Nondistended. Soft and nontender. No masses palpable. No organomegaly. No palpable hernias. Extremities: . bilateral 1+ edema and chronic venous stasis changes. Neurologic: Alert and fully oriented. Gait normal. No focal weakness. Psychiatric: Normal mood and affect. Thought content appropriate with normal judgement and insight    Assessment & Plan Darlene Gregory T. Guerino Caporale MD; 10/06/2016 2:41 PM) MALIGNANT NEOPLASM OF UPPER-OUTER QUADRANT OF LEFT BREAST IN FEMALE, ESTROGEN RECEPTOR POSITIVE (C50.412) Impression: 83 year old female with a new diagnosis of cancer of the left breast, upper outer quadrant. Clinical stage 1 a, ER positive, PR positive, HER-2 negative. I discussed with the patient and family members present today initial surgical treatment options. We discussed options of breast conservation with lumpectomy or total mastectomy and sentinal lymph node biopsy/dissection. we discussed th We also discussedideal candidate for lumpectomy. We also  discussed that sentinel lymph node biopsy would not change her prognosis or treatment plan and I would not recommend axillary lymph node biopsy. After discussion they have elected to proceed with radioactive seed localized left breast lumpectomy. We discussed the indications and nature of the procedure, and expected recovery, in detail. Surgical risks including anesthetic complications, cardiorespiratory complications, bleeding, infection, wound healing complications, blood clots, local and distant recurrence and possible need for further surgery based on the final pathology was discussed and understood. Chemotherapy, hormonal therapy and radiation therapy have been discussed. They have been provided with literature regarding the treatment of breast cancer. All questions were answered. They understand and agree to proceed and we will go ahead with scheduling. Current Plans radioactive seed localized left breast lumpectomy under general anesthesia as an outpatient

## 2016-10-29 NOTE — Transfer of Care (Signed)
Immediate Anesthesia Transfer of Care Note  Patient: Darlene Gregory  Procedure(s) Performed: Procedure(s): LEFT BREAST LUMPECTOMY WITH RADIOACTIVE SEED LOCALIZATION (Left)  Patient Location: PACU  Anesthesia Type:General  Level of Consciousness: awake, alert , oriented and patient cooperative  Airway & Oxygen Therapy: Patient Spontanous Breathing and Patient connected to face mask oxygen  Post-op Assessment: Report given to RN and Post -op Vital signs reviewed and stable  Post vital signs: Reviewed and stable  Last Vitals:  Vitals:   10/29/16 0638  BP: (!) 142/60  Pulse: (!) 52  Resp: 18  Temp: 36.5 C    Last Pain:  Vitals:   10/29/16 0638  TempSrc: Oral         Complications: No apparent anesthesia complications

## 2016-10-29 NOTE — Op Note (Signed)
Preoperative Diagnosis: LEFT BREAST CANCER  Postoprative Diagnosis: LEFT BREAST CANCER  Procedure: Procedure(s): LEFT BREAST LUMPECTOMY WITH RADIOACTIVE SEED LOCALIZATION   Surgeon: Excell Seltzer T   Assistants: None  Anesthesia:  General LMA anesthesia  Indications: Patient is an 83 year old female with a recent abnormal screening mammogram and subsequent large core needle biopsy showing invasive ductal carcinoma tumor measuring 8 mm in maximum diameter. After preoperative workup and discussion detailed elsewhere we have elected to proceed with radioactive seed localized lumpectomy as initial surgical treatment.    Procedure Detail:  Patient had previously undergone accurate placement of a radioactive seed at the tumor and clip site in the upper outer left breast. Patient was seen and marked in the holding area, brought to the operating room and placed in the supine position on the operating table. General LMA anesthesia induced. She received preoperative IV antibiotics. PAS were in place. The left breast was widely sterilely prepped and draped. Patient timeout was performed and correct procedure verified. The site of the seton tumor was localized with the neoprobe. A curvilinear incision in a skin crease was made at this point and dissection carried down into the subcutaneous tissue. Short skin and subcutaneous flaps were raised over the fairly superficial seton tumor. Using the neoprobe for guidance a globular approximately 3 cm specimen was excised around the area of high counts. The specimen was inked for margins. Specimen x-ray showed the seed and the marking clip centrally located. This was sent for permanent pathology. Soft tissue was infiltrated with Marcaine. Complete hemostasis was obtained. The lumpectomy cavity was marked with clips. The breast and subcutaneous tissue was closed with interrupted 3-0 Vicryl and the skin closed with subcuticular 5-0 Monocryl and Dermabond. Sponge  needle and instrument counts were correct.    Findings: As above  Estimated Blood Loss:  Minimal         Drains: None  Blood Given: none          Specimens: Left breast lumpectomy        Complications:  * No complications entered in OR log *         Disposition: PACU - hemodynamically stable.         Condition: stable

## 2016-10-29 NOTE — Discharge Instructions (Signed)
°Post Anesthesia Home Care Instructions ° °Activity: °Get plenty of rest for the remainder of the day. A responsible individual must stay with you for 24 hours following the procedure.  °For the next 24 hours, DO NOT: °-Drive a car °-Operate machinery °-Drink alcoholic beverages °-Take any medication unless instructed by your physician °-Make any legal decisions or sign important papers. ° °Meals: °Start with liquid foods such as gelatin or soup. Progress to regular foods as tolerated. Avoid greasy, spicy, heavy foods. If nausea and/or vomiting occur, drink only clear liquids until the nausea and/or vomiting subsides. Call your physician if vomiting continues. ° °Special Instructions/Symptoms: °Your throat may feel dry or sore from the anesthesia or the breathing tube placed in your throat during surgery. If this causes discomfort, gargle with warm salt water. The discomfort should disappear within 24 hours. ° °If you had a scopolamine patch placed behind your ear for the management of post- operative nausea and/or vomiting: ° °1. The medication in the patch is effective for 72 hours, after which it should be removed.  Wrap patch in a tissue and discard in the trash. Wash hands thoroughly with soap and water. °2. You may remove the patch earlier than 72 hours if you experience unpleasant side effects which may include dry mouth, dizziness or visual disturbances. °3. Avoid touching the patch. Wash your hands with soap and water after contact with the patch. °  ° ° ° ° °[facility] °Office Phone Number [phone] ° °BREAST BIOPSY/ PARTIAL MASTECTOMY: POST OP INSTRUCTIONS ° °Always review your discharge instruction sheet given to you by the facility where your surgery was performed. ° °IF YOU HAVE DISABILITY OR FAMILY LEAVE FORMS, YOU MUST BRING THEM TO THE OFFICE FOR PROCESSING.  DO NOT GIVE THEM TO YOUR DOCTOR. ° °1. A prescription for pain medication may be given to you upon discharge.  Take your  pain medication as prescribed, if needed.  If narcotic pain medicine is not needed, then you may take acetaminophen (Tylenol) or ibuprofen (Advil) as needed. °2. Take your usually prescribed medications unless otherwise directed °3. If you need a refill on your pain medication, please contact your pharmacy.  They will contact our office to request authorization.  Prescriptions will not be filled after 5pm or on week-ends. °4. You should eat very light the first 24 hours after surgery, such as soup, crackers, pudding, etc.  Resume your normal diet the day after surgery. °5. Most patients will experience some swelling and bruising in the breast.  Ice packs and a good support bra will help.  Swelling and bruising can take several days to resolve.  °6. It is common to experience some constipation if taking pain medication after surgery.  Increasing fluid intake and taking a stool softener will usually help or prevent this problem from occurring.  A mild laxative (Milk of Magnesia or Miralax) should be taken according to package directions if there are no bowel movements after 48 hours. °7. Unless discharge instructions indicate otherwise, you may remove your bandages 24-48 hours after surgery, and you may shower at that time.  You may have steri-strips (small skin tapes) in place directly over the incision.  These strips should be left on the skin for 7-10 days.  If your surgeon used skin glue on the incision, you may shower in 24 hours.  The glue will flake off over the next 2-3 weeks.  Any sutures or staples will be removed at the office during your follow-up visit. °  8. ACTIVITIES:  You may resume regular daily activities (gradually increasing) beginning the next day.  Wearing a good support bra or sports bra minimizes pain and swelling.  You may have sexual intercourse when it is comfortable. °a. You may drive when you no longer are taking prescription pain medication, you can comfortably wear a seatbelt, and you can  safely maneuver your car and apply brakes. °b. RETURN TO WORK:  ______________________________________________________________________________________ °9. You should see your doctor in the office for a follow-up appointment approximately two weeks after your surgery.  Your doctor’s nurse will typically make your follow-up appointment when she calls you with your pathology report.  Expect your pathology report 2-3 business days after your surgery.  You may call to check if you do not hear from us after three days. °10. OTHER INSTRUCTIONS: _______________________________________________________________________________________________ _____________________________________________________________________________________________________________________________________ °_____________________________________________________________________________________________________________________________________ °_____________________________________________________________________________________________________________________________________ ° °WHEN TO CALL YOUR DOCTOR: °1. Fever over 101.0 °2. Nausea and/or vomiting. °3. Extreme swelling or bruising. °4. Continued bleeding from incision. °5. Increased pain, redness, or drainage from the incision. ° °The clinic staff is available to answer your questions during regular business hours.  Please don’t hesitate to call and ask to speak to one of the nurses for clinical concerns.  If you have a medical emergency, go to the nearest emergency room or call 911.  A surgeon from [facility] is always on call at the hospital. ° °For further questions, please visit [email or web address]  °

## 2016-11-01 ENCOUNTER — Other Ambulatory Visit: Payer: Self-pay | Admitting: Oncology

## 2016-11-01 ENCOUNTER — Encounter (HOSPITAL_BASED_OUTPATIENT_CLINIC_OR_DEPARTMENT_OTHER): Payer: Self-pay | Admitting: General Surgery

## 2016-11-03 ENCOUNTER — Encounter (HOSPITAL_COMMUNITY): Payer: Self-pay | Admitting: *Deleted

## 2016-11-04 NOTE — Anesthesia Postprocedure Evaluation (Signed)
Anesthesia Post Note  Patient: Darlene Gregory  Procedure(s) Performed: Procedure(s) (LRB): LEFT BREAST LUMPECTOMY WITH RADIOACTIVE SEED LOCALIZATION (Left)     Patient location during evaluation: PACU Anesthesia Type: General Level of consciousness: awake and alert Pain management: pain level controlled Vital Signs Assessment: post-procedure vital signs reviewed and stable Respiratory status: spontaneous breathing, nonlabored ventilation, respiratory function stable and patient connected to nasal cannula oxygen Cardiovascular status: blood pressure returned to baseline and stable Postop Assessment: no signs of nausea or vomiting Anesthetic complications: no    Last Vitals:  Vitals:   10/29/16 0915 10/29/16 0931  BP: (!) 148/62 (!) 145/63  Pulse: 81 86  Resp: 15 16  Temp: 36.4 C 36.7 C    Last Pain:  Vitals:   11/01/16 0916  TempSrc:   PainSc: 0-No pain                 Elizjah Noblet,JAMES TERRILL

## 2016-11-05 ENCOUNTER — Ambulatory Visit (HOSPITAL_COMMUNITY)
Admission: RE | Admit: 2016-11-05 | Discharge: 2016-11-05 | Disposition: A | Discharge status: Home / Self Care | Payer: Medicare Other | Source: Ambulatory Visit | Attending: General Surgery | Admitting: General Surgery

## 2016-11-05 ENCOUNTER — Encounter (HOSPITAL_BASED_OUTPATIENT_CLINIC_OR_DEPARTMENT_OTHER): Payer: Self-pay | Admitting: *Deleted

## 2016-11-05 ENCOUNTER — Ambulatory Visit (HOSPITAL_BASED_OUTPATIENT_CLINIC_OR_DEPARTMENT_OTHER): Payer: Medicare Other | Admitting: Anesthesiology

## 2016-11-05 ENCOUNTER — Encounter (HOSPITAL_BASED_OUTPATIENT_CLINIC_OR_DEPARTMENT_OTHER)
Admission: RE | Disposition: A | Discharge status: Home / Self Care | Payer: Self-pay | Source: Ambulatory Visit | Attending: General Surgery

## 2016-11-05 DIAGNOSIS — Z882 Allergy status to sulfonamides status: Secondary | ICD-10-CM | POA: Diagnosis not present

## 2016-11-05 DIAGNOSIS — I10 Essential (primary) hypertension: Secondary | ICD-10-CM | POA: Insufficient documentation

## 2016-11-05 DIAGNOSIS — Z87891 Personal history of nicotine dependence: Secondary | ICD-10-CM | POA: Insufficient documentation

## 2016-11-05 DIAGNOSIS — Z79899 Other long term (current) drug therapy: Secondary | ICD-10-CM | POA: Diagnosis not present

## 2016-11-05 DIAGNOSIS — D0512 Intraductal carcinoma in situ of left breast: Secondary | ICD-10-CM | POA: Insufficient documentation

## 2016-11-05 DIAGNOSIS — E039 Hypothyroidism, unspecified: Secondary | ICD-10-CM | POA: Insufficient documentation

## 2016-11-05 DIAGNOSIS — K219 Gastro-esophageal reflux disease without esophagitis: Secondary | ICD-10-CM | POA: Diagnosis not present

## 2016-11-05 HISTORY — PX: RE-EXCISION OF BREAST LUMPECTOMY: SHX6048

## 2016-11-05 SURGERY — EXCISION, LESION, BREAST
Anesthesia: General | Site: Breast | Laterality: Left

## 2016-11-05 MED ORDER — DEXAMETHASONE SODIUM PHOSPHATE 4 MG/ML IJ SOLN
INTRAMUSCULAR | Status: DC | PRN
  Administered 2016-11-05: 10 mg via INTRAVENOUS

## 2016-11-05 MED ORDER — PROPOFOL 500 MG/50ML IV EMUL
INTRAVENOUS | Status: DC | PRN
  Administered 2016-11-05: 75 ug/kg/min via INTRAVENOUS

## 2016-11-05 MED ORDER — PROPOFOL 10 MG/ML IV BOLUS
INTRAVENOUS | Status: DC | PRN
  Administered 2016-11-05: 100 mg via INTRAVENOUS

## 2016-11-05 MED ORDER — EPHEDRINE SULFATE 50 MG/ML IJ SOLN
INTRAMUSCULAR | Status: DC | PRN
  Administered 2016-11-05: 25 mg via INTRAVENOUS

## 2016-11-05 MED ORDER — PROPOFOL 10 MG/ML IV BOLUS
INTRAVENOUS | Status: AC
  Filled 2016-11-05: qty 20

## 2016-11-05 MED ORDER — SUCCINYLCHOLINE CHLORIDE 200 MG/10ML IV SOSY
PREFILLED_SYRINGE | INTRAVENOUS | Status: AC
  Filled 2016-11-05: qty 10

## 2016-11-05 MED ORDER — FENTANYL CITRATE (PF) 100 MCG/2ML IJ SOLN
INTRAMUSCULAR | Status: DC | PRN
  Administered 2016-11-05 (×2): 50 ug via INTRAVENOUS

## 2016-11-05 MED ORDER — ONDANSETRON HCL 4 MG/2ML IJ SOLN
INTRAMUSCULAR | Status: AC
  Filled 2016-11-05: qty 2

## 2016-11-05 MED ORDER — CEFAZOLIN SODIUM-DEXTROSE 2-3 GM-% IV SOLR
INTRAVENOUS | Status: DC | PRN
  Administered 2016-11-05: 2 g via INTRAVENOUS

## 2016-11-05 MED ORDER — EPHEDRINE 5 MG/ML INJ
INTRAVENOUS | Status: AC
  Filled 2016-11-05: qty 10

## 2016-11-05 MED ORDER — DEXAMETHASONE SODIUM PHOSPHATE 10 MG/ML IJ SOLN
INTRAMUSCULAR | Status: AC
  Filled 2016-11-05: qty 1

## 2016-11-05 MED ORDER — PROPOFOL 500 MG/50ML IV EMUL
INTRAVENOUS | Status: AC
  Filled 2016-11-05: qty 50

## 2016-11-05 MED ORDER — SCOPOLAMINE 1 MG/3DAYS TD PT72: 1.0000 | MEDICATED_PATCH | Freq: Once | TRANSDERMAL | Status: DC | PRN

## 2016-11-05 MED ORDER — PHENYLEPHRINE HCL 10 MG/ML IJ SOLN
INTRAMUSCULAR | Status: DC | PRN
  Administered 2016-11-05: 80 ug via INTRAVENOUS

## 2016-11-05 MED ORDER — LACTATED RINGERS IV SOLN
INTRAVENOUS | Status: DC
  Administered 2016-11-05: 08:00:00 via INTRAVENOUS
  Administered 2016-11-05: 10 mL/h via INTRAVENOUS

## 2016-11-05 MED ORDER — FENTANYL CITRATE (PF) 100 MCG/2ML IJ SOLN
INTRAMUSCULAR | Status: AC
  Filled 2016-11-05: qty 2

## 2016-11-05 MED ORDER — LIDOCAINE 2% (20 MG/ML) 5 ML SYRINGE
INTRAMUSCULAR | Status: AC
  Filled 2016-11-05: qty 5

## 2016-11-05 MED ORDER — ONDANSETRON HCL 4 MG/2ML IJ SOLN
INTRAMUSCULAR | Status: DC | PRN
  Administered 2016-11-05: 4 mg via INTRAVENOUS

## 2016-11-05 MED ORDER — BUPIVACAINE-EPINEPHRINE 0.5% -1:200000 IJ SOLN
INTRAMUSCULAR | Status: DC | PRN
  Administered 2016-11-05: 20 mL

## 2016-11-05 MED ORDER — FENTANYL CITRATE (PF) 100 MCG/2ML IJ SOLN: 50.0000 ug | INTRAMUSCULAR | Status: DC | PRN

## 2016-11-05 MED ORDER — PHENYLEPHRINE 40 MCG/ML (10ML) SYRINGE FOR IV PUSH (FOR BLOOD PRESSURE SUPPORT)
PREFILLED_SYRINGE | INTRAVENOUS | Status: AC
  Filled 2016-11-05: qty 10

## 2016-11-05 MED ORDER — LIDOCAINE HCL (CARDIAC) 20 MG/ML IV SOLN
INTRAVENOUS | Status: DC | PRN
  Administered 2016-11-05: 30 mg via INTRAVENOUS

## 2016-11-05 MED ORDER — MIDAZOLAM HCL 2 MG/2ML IJ SOLN: 1.0000 mg | INTRAMUSCULAR | Status: DC | PRN

## 2016-11-05 MED ORDER — FENTANYL CITRATE (PF) 100 MCG/2ML IJ SOLN: 25.0000 ug | INTRAMUSCULAR | Status: DC | PRN

## 2016-11-05 SURGICAL SUPPLY — 37 items
BLADE SURG 15 STRL LF DISP TIS (BLADE) ×1 IMPLANT
BLADE SURG 15 STRL SS (BLADE) ×2
CANISTER SUCT 1200ML W/VALVE (MISCELLANEOUS) ×3 IMPLANT
CHLORAPREP W/TINT 26ML (MISCELLANEOUS) ×3 IMPLANT
CLIP TI WIDE RED SMALL 6 (CLIP) IMPLANT
COVER BACK TABLE 60X90IN (DRAPES) ×3 IMPLANT
COVER MAYO STAND STRL (DRAPES) ×3 IMPLANT
DERMABOND ADVANCED (GAUZE/BANDAGES/DRESSINGS) ×2
DERMABOND ADVANCED .7 DNX12 (GAUZE/BANDAGES/DRESSINGS) ×1 IMPLANT
DEVICE DUBIN W/COMP PLATE 8390 (MISCELLANEOUS) IMPLANT
DRAPE LAPAROSCOPIC ABDOMINAL (DRAPES) ×3 IMPLANT
DRAPE UTILITY XL STRL (DRAPES) ×3 IMPLANT
ELECT COATED BLADE 2.86 ST (ELECTRODE) ×3 IMPLANT
ELECT REM PT RETURN 9FT ADLT (ELECTROSURGICAL) ×3
ELECTRODE REM PT RTRN 9FT ADLT (ELECTROSURGICAL) ×1 IMPLANT
GLOVE BIOGEL PI IND STRL 8 (GLOVE) ×1 IMPLANT
GLOVE BIOGEL PI INDICATOR 8 (GLOVE) ×2
GLOVE ECLIPSE 7.5 STRL STRAW (GLOVE) ×3 IMPLANT
GOWN STRL REUS W/ TWL LRG LVL3 (GOWN DISPOSABLE) ×1 IMPLANT
GOWN STRL REUS W/ TWL XL LVL3 (GOWN DISPOSABLE) ×1 IMPLANT
GOWN STRL REUS W/TWL LRG LVL3 (GOWN DISPOSABLE) ×2
GOWN STRL REUS W/TWL XL LVL3 (GOWN DISPOSABLE) ×2
KIT MARKER MARGIN INK (KITS) IMPLANT
NEEDLE HYPO 25X1 1.5 SAFETY (NEEDLE) ×3 IMPLANT
NS IRRIG 1000ML POUR BTL (IV SOLUTION) IMPLANT
PACK BASIN DAY SURGERY FS (CUSTOM PROCEDURE TRAY) ×3 IMPLANT
PENCIL BUTTON HOLSTER BLD 10FT (ELECTRODE) ×3 IMPLANT
SLEEVE SCD COMPRESS KNEE MED (MISCELLANEOUS) ×3 IMPLANT
SUT MON AB 5-0 PS2 18 (SUTURE) ×3 IMPLANT
SUT VICRYL 3-0 CR8 SH (SUTURE) ×3 IMPLANT
SYR BULB 3OZ (MISCELLANEOUS) IMPLANT
SYR CONTROL 10ML LL (SYRINGE) ×3 IMPLANT
TOWEL OR 17X24 6PK STRL BLUE (TOWEL DISPOSABLE) ×3 IMPLANT
TOWEL OR NON WOVEN STRL DISP B (DISPOSABLE) ×3 IMPLANT
TUBE CONNECTING 20'X1/4 (TUBING) ×1
TUBE CONNECTING 20X1/4 (TUBING) ×2 IMPLANT
YANKAUER SUCT BULB TIP NO VENT (SUCTIONS) ×3 IMPLANT

## 2016-11-05 NOTE — Op Note (Signed)
Preoperative Diagnosis: LEFT BREAST CANCER  Postoprative Diagnosis: LEFT BREAST CANCER  Procedure: Procedure(s): RE-EXCISION OF LEFT BREAST LUMPECTOMY   Surgeon: Excell Seltzer T   Assistants: None  Anesthesia:  General LMA anesthesia  Indications: Patient is recently status post left breast lumpectomy for a 0.8 cm invasive ductal carcinoma of the upper outer left breast by imaging. Despite intraoperative imaging showing the marking clip and seed very centrally located within the specimen, pathology has shown a positive inferior margin for invasive and in situ carcinoma with the tumor measuring possibly 1.2 cm. I recommended reexcision of the inferior margin after discussion with the patient regarding indications and risks she is in agreement.    Procedure Detail:  Patient was brought to the operating room, placed in the supine position on the operating table, and general laryngeal mask anesthesia induced. The left breast was widely sterilely prepped and draped. She received preoperative IV antibiotics. Patient timeout was performed and correct procedure verified. The previous incision was sharply opened and dissection carried down through the subcutaneous tissue. Previous suture material was removed and the lumpectomy cavity widely opened and exposed. The inferior margin of the lumpectomy cavity was exposed and then I widely excised the entire inferior margin back for almost 1 cm using cautery. This was removed intact and oriented with ink and sent for permanent pathology. Soft tissue was infiltrated with Marcaine. Complete hemostasis was obtained. Further marking clips were placed. The breast and subcutaneous tissue is closed with interrupted 3-0 Vicryl and skin with running subcuticular 5-0 Monocryl and Dermabond. Sponge and needle instrument counts were correct.    Findings: As above  Estimated Blood Loss:  Minimal         Drains: None  Blood Given: none          Specimens: #1  reexcision inferior margin oriented with ink  #2 additional loose subcutaneous tissue        Complications:  * No complications entered in OR log *         Disposition: PACU - hemodynamically stable.         Condition: stable

## 2016-11-05 NOTE — Discharge Instructions (Signed)
[facility] °Office Phone Number [phone] ° °BREAST BIOPSY/ PARTIAL MASTECTOMY: POST OP INSTRUCTIONS ° °Always review your discharge instruction sheet given to you by the facility where your surgery was performed. ° °IF YOU HAVE DISABILITY OR FAMILY LEAVE FORMS, YOU MUST BRING THEM TO THE OFFICE FOR PROCESSING.  DO NOT GIVE THEM TO YOUR DOCTOR. ° °1. A prescription for pain medication may be given to you upon discharge.  Take your pain medication as prescribed, if needed.  If narcotic pain medicine is not needed, then you may take acetaminophen (Tylenol) or ibuprofen (Advil) as needed. °2. Take your usually prescribed medications unless otherwise directed °3. If you need a refill on your pain medication, please contact your pharmacy.  They will contact our office to request authorization.  Prescriptions will not be filled after 5pm or on week-ends. °4. You should eat very light the first 24 hours after surgery, such as soup, crackers, pudding, etc.  Resume your normal diet the day after surgery. °5. Most patients will experience some swelling and bruising in the breast.  Ice packs and a good support bra will help.  Swelling and bruising can take several days to resolve.  °6. It is common to experience some constipation if taking pain medication after surgery.  Increasing fluid intake and taking a stool softener will usually help or prevent this problem from occurring.  A mild laxative (Milk of Magnesia or Miralax) should be taken according to package directions if there are no bowel movements after 48 hours. °7. Unless discharge instructions indicate otherwise, you may remove your bandages 24-48 hours after surgery, and you may shower at that time.  You may have steri-strips (small skin tapes) in place directly over the incision.  These strips should be left on the skin for 7-10 days.  If your surgeon used skin glue on the incision, you may shower in 24 hours.  The glue will flake off over the  next 2-3 weeks.  Any sutures or staples will be removed at the office during your follow-up visit. °8. ACTIVITIES:  You may resume regular daily activities (gradually increasing) beginning the next day.  Wearing a good support bra or sports bra minimizes pain and swelling.  You may have sexual intercourse when it is comfortable. °a. You may drive when you no longer are taking prescription pain medication, you can comfortably wear a seatbelt, and you can safely maneuver your car and apply brakes. °b. RETURN TO WORK:  ______________________________________________________________________________________ °9. You should see your doctor in the office for a follow-up appointment approximately two weeks after your surgery.  Your doctor’s nurse will typically make your follow-up appointment when she calls you with your pathology report.  Expect your pathology report 2-3 business days after your surgery.  You may call to check if you do not hear from us after three days. °10. OTHER INSTRUCTIONS: _______________________________________________________________________________________________ _____________________________________________________________________________________________________________________________________ °_____________________________________________________________________________________________________________________________________ °_____________________________________________________________________________________________________________________________________ ° °WHEN TO CALL YOUR DOCTOR: °1. Fever over 101.0 °2. Nausea and/or vomiting. °3. Extreme swelling or bruising. °4. Continued bleeding from incision. °5. Increased pain, redness, or drainage from the incision. ° °The clinic staff is available to answer your questions during regular business hours.  Please don’t hesitate to call and ask to speak to one of the nurses for clinical concerns.  If you have a medical emergency, go to the nearest  emergency room or call 911.  A surgeon from [facility] is always on call at the hospital. ° °For further questions, please visit [email or web address]  ° ° ° ° °  Post Anesthesia Home Care Instructions ° °Activity: °Get plenty of rest for the remainder of the day. A responsible individual must stay with you for 24 hours following the procedure.  °For the next 24 hours, DO NOT: °-Drive a car °-Operate machinery °-Drink alcoholic beverages °-Take any medication unless instructed by your physician °-Make any legal decisions or sign important papers. ° °Meals: °Start with liquid foods such as gelatin or soup. Progress to regular foods as tolerated. Avoid greasy, spicy, heavy foods. If nausea and/or vomiting occur, drink only clear liquids until the nausea and/or vomiting subsides. Call your physician if vomiting continues. ° °Special Instructions/Symptoms: °Your throat may feel dry or sore from the anesthesia or the breathing tube placed in your throat during surgery. If this causes discomfort, gargle with warm salt water. The discomfort should disappear within 24 hours. ° °If you had a scopolamine patch placed behind your ear for the management of post- operative nausea and/or vomiting: ° °1. The medication in the patch is effective for 72 hours, after which it should be removed.  Wrap patch in a tissue and discard in the trash. Wash hands thoroughly with soap and water. °2. You may remove the patch earlier than 72 hours if you experience unpleasant side effects which may include dry mouth, dizziness or visual disturbances. °3. Avoid touching the patch. Wash your hands with soap and water after contact with the patch. °  ° °

## 2016-11-05 NOTE — Anesthesia Postprocedure Evaluation (Signed)
Anesthesia Post Note  Patient: Darlene Gregory  Procedure(s) Performed: Procedure(s) (LRB): RE-EXCISION OF LEFT BREAST LUMPECTOMY (Left)     Patient location during evaluation: PACU Anesthesia Type: General Level of consciousness: awake and alert Pain management: pain level controlled Vital Signs Assessment: post-procedure vital signs reviewed and stable Respiratory status: spontaneous breathing, nonlabored ventilation, respiratory function stable and patient connected to nasal cannula oxygen Cardiovascular status: blood pressure returned to baseline and stable Postop Assessment: no signs of nausea or vomiting Anesthetic complications: no    Last Vitals:  Vitals:   11/05/16 0930 11/05/16 0957  BP:  (!) 163/72  Pulse: 69 84  Resp: 13 18  Temp:  36.3 C    Last Pain:  Vitals:   11/05/16 0957  TempSrc:   PainSc: 0-No pain                 Effie Berkshire

## 2016-11-05 NOTE — H&P (Signed)
Darlene Gregory is an 83 y.o. female.    HPI: Patient status post recent left breast lumpectomy for invasive ductal carcinoma of the left breast. Despite apparent accurate imaging at her lumpectomy at the time of surgery follow-up pathology shows a broadly positive inferior margin. She presents for reexcision of her left breast lumpectomy.  Past Medical History:  Diagnosis Date  . Cancer ([place]) 10/2016   left breast cancer  . Difficult intubation    "small trachea"; 01/18/05: glidescope but unable to pass stylet -->fast track LMA with blind passage for ETT, consider awake intubation; 08/14/08: IV induction and DL with glidesecope for ETT  . DJD (degenerative joint disease)   . Frequent urination   . GERD (gastroesophageal reflux disease)   . Hypertension   . Hypothyroidism   . Osteoporosis   . PONV (postoperative nausea and vomiting)   . Thyroid disease     Past Surgical History:  Procedure Laterality Date  . ABDOMINAL HYSTERECTOMY    . BREAST LUMPECTOMY WITH RADIOACTIVE SEED LOCALIZATION Left 10/29/2016   Procedure: LEFT BREAST LUMPECTOMY WITH RADIOACTIVE SEED LOCALIZATION;  Surgeon: Excell Seltzer, MD;  Location: [facility];  Service: General;  Laterality: Left;  . CAROTID BODY TUMOR EXCISION    . COLONOSCOPY W/ POLYPECTOMY    . EYE SURGERY Bilateral    cataract removed  . JOINT REPLACEMENT Right 2006   knee  . PAROTIDECTOMY Left   . TOTAL KNEE ARTHROPLASTY Left 03/13/2014   Procedure: LEFT TOTAL KNEE ARTHROPLASTY;  Surgeon: Kerin Salen, MD;  Location: [facility];  Service: Orthopedics;  Laterality: Left;    Family History  Problem Relation Age of Onset  . Heart disease Mother   . Emphysema Father    Social History:  reports that she has quit smoking. She has never used smokeless tobacco. She reports that she does not drink alcohol or use drugs.  Allergies:  Allergies  Allergen Reactions  . Hyoscyamine Sulfate     REACTION: rash  . Norvasc [Amlodipine  Besylate] Swelling  . Phenazopyridine Hcl Rash  . Sulfonamide Derivatives Rash    Medications Prior to Admission  Medication Sig Dispense Refill  . Cholecalciferol (VITAMIN D) 2000 UNITS CAPS Take by mouth daily.    . cloNIDine (CATAPRES) 0.1 MG tablet Take 1 tablet (0.1 mg total) by mouth 2 (two) times daily. 180 tablet 1  . FIBER PO Take 2 each by mouth daily.     . fish oil-omega-3 fatty acids 1000 MG capsule Take 1 g by mouth daily.    Marland Kitchen levothyroxine (SYNTHROID, LEVOTHROID) 75 MCG tablet Take 1 tablet (75 mcg total) by mouth daily. 90 tablet 1  . Multiple Vitamins-Minerals (ICAPS PO) Take 1 tablet by mouth daily.     Marland Kitchen olmesartan-hydrochlorothiazide (BENICAR HCT) 40-25 MG tablet Take 1 tablet by mouth daily. 30 tablet 5  . omeprazole (PRILOSEC) 20 MG capsule Take 1 capsule (20 mg total) by mouth daily. 90 capsule 1  . traMADol (ULTRAM) 50 MG tablet Take 1 tablet (50 mg total) by mouth every 6 (six) hours as needed for moderate pain. 10 tablet 1  . vitamin C (ASCORBIC ACID) 500 MG tablet Take 500 mg by mouth daily.      No results found for this or any previous visit (from the past 48 hour(s)). No results found.  Review of Systems  Constitutional: Negative for chills and fever.  Respiratory: Negative for shortness of breath.   Cardiovascular: Negative for chest pain.  Blood pressure (!) 152/52, pulse (!) 58, temperature 97.6 F (36.4 C), temperature source Oral, resp. rate 18, height 5\' 3"  (1.6 m), weight 82.8 kg (182 lb 9.6 oz), SpO2 100 %. Physical Exam  General: Alert, well-developed elderly Caucasian female, in no distress Skin: Warm and dry without rash or infection. HEENT: No palpable masses or thyromegaly. Sclera nonicteric.  Lymph nodes: No cervical, supraclavicular, or inguinal nodes palpable. Breasts: Healing left breast lumpectomy without evidence of infection Lungs: Breath sounds clear and equal without increased work of breathing Cardiovascular: Regular rate  and rhythm without murmur.  Extremities: No edema or joint swelling or deformity. No chronic venous stasis changes. Neurologic: Alert and fully oriented. Gait normal. Assessment/Plan Cancer left breast, status post recent lumpectomy with positive inferior margin. Patient presents for reexcision under general anesthesia. I discussed the nature of surgery and indications, expected recovery and risks of bleeding, infection or further positive margins and she agrees and understands  Ryett Hamman T, MD 11/05/2016, 7:28 AM

## 2016-11-05 NOTE — Transfer of Care (Signed)
Immediate Anesthesia Transfer of Care Note  Patient: Darlene Gregory  Procedure(s) Performed: Procedure(s): RE-EXCISION OF LEFT BREAST LUMPECTOMY (Left)  Patient Location: PACU  Anesthesia Type:General  Level of Consciousness: awake and patient cooperative  Airway & Oxygen Therapy: Patient Spontanous Breathing and Patient connected to face mask oxygen  Post-op Assessment: Report given to RN and Post -op Vital signs reviewed and stable  Post vital signs: Reviewed and stable  Last Vitals:  Vitals:   11/05/16 0638  BP: (!) 152/52  Pulse: (!) 58  Resp: 18  Temp: 36.4 C    Last Pain:  Vitals:   11/05/16 0638  TempSrc: Oral  PainSc: 0-No pain         Complications: No apparent anesthesia complications

## 2016-11-05 NOTE — Anesthesia Preprocedure Evaluation (Addendum)
Anesthesia Evaluation  Patient identified by MRN, date of birth, ID band Patient awake    Reviewed: Allergy & Precautions, NPO status , Patient's Chart, lab work & pertinent test results  History of Anesthesia Complications (+) PONV, DIFFICULT AIRWAY and history of anesthetic complications  Airway Mallampati: I  TM Distance: >3 FB Neck ROM: Full    Dental  (+) Teeth Intact, Dental Advisory Given   Pulmonary former smoker,    breath sounds clear to auscultation       Cardiovascular hypertension, Pt. on medications  Rhythm:Regular Rate:Normal     Neuro/Psych PSYCHIATRIC DISORDERS  Neuromuscular disease    GI/Hepatic Neg liver ROS, GERD  Medicated,  Endo/Other  Hypothyroidism   Renal/GU negative Renal ROS  negative genitourinary   Musculoskeletal  (+) Arthritis , Osteoarthritis,    Abdominal   Peds negative pediatric ROS (+)  Hematology negative hematology ROS (+)   Anesthesia Other Findings   Reproductive/Obstetrics negative OB ROS                             Anesthesia Physical Anesthesia Plan  ASA: II  Anesthesia Plan: General   Post-op Pain Management:    Induction: Intravenous  PONV Risk Score and Plan: 4 or greater and Ondansetron, Dexamethasone, Propofol, Midazolam and Treatment may vary due to age or medical condition  Airway Management Planned: LMA  Additional Equipment:   Intra-op Plan:   Post-operative Plan: Extubation in OR  Informed Consent: I have reviewed the patients History and Physical, chart, labs and discussed the procedure including the risks, benefits and alternatives for the proposed anesthesia with the patient or authorized representative who has indicated his/her understanding and acceptance.   Dental advisory given  Plan Discussed with: CRNA  Anesthesia Plan Comments: (TIVA - Propofol infusion. )       Anesthesia Quick Evaluation

## 2016-11-08 ENCOUNTER — Ambulatory Visit: Payer: Self-pay | Admitting: General Surgery

## 2016-11-08 ENCOUNTER — Encounter (HOSPITAL_BASED_OUTPATIENT_CLINIC_OR_DEPARTMENT_OTHER): Payer: Self-pay | Admitting: General Surgery

## 2016-11-09 ENCOUNTER — Encounter (HOSPITAL_BASED_OUTPATIENT_CLINIC_OR_DEPARTMENT_OTHER): Payer: Self-pay | Admitting: *Deleted

## 2016-11-09 NOTE — Progress Notes (Addendum)
Pt is unable to come in for Labs - due to her husband falling and she needs to care for him- plan to do I stat on day of surgery. Also requested to have Darlene Jobs CRNA for her case - spoke with Burman Nieves.Pack an overnight bag.

## 2016-11-11 NOTE — Anesthesia Preprocedure Evaluation (Addendum)
Anesthesia Evaluation  Patient identified by MRN, date of birth, ID band Patient awake    Reviewed: Allergy & Precautions, NPO status , Patient's Chart, lab work & pertinent test results  History of Anesthesia Complications (+) PONV, DIFFICULT AIRWAY and history of anesthetic complications  Airway Mallampati: I  TM Distance: >3 FB Neck ROM: Full    Dental  (+) Teeth Intact, Dental Advisory Given   Pulmonary former smoker,    breath sounds clear to auscultation       Cardiovascular hypertension, Pt. on medications  Rhythm:Regular Rate:Normal     Neuro/Psych PSYCHIATRIC DISORDERS  Neuromuscular disease    GI/Hepatic Neg liver ROS, GERD  Medicated,  Endo/Other  Hypothyroidism   Renal/GU negative Renal ROS  negative genitourinary   Musculoskeletal  (+) Arthritis , Osteoarthritis,    Abdominal   Peds negative pediatric ROS (+)  Hematology negative hematology ROS (+)   Anesthesia Other Findings   Reproductive/Obstetrics negative OB ROS                             Anesthesia Physical  Anesthesia Plan  ASA: II  Anesthesia Plan: General and Regional   Post-op Pain Management: GA combined w/ Regional for post-op pain   Induction: Intravenous  PONV Risk Score and Plan: 4 or greater and Ondansetron, Dexamethasone, Propofol, Midazolam and Treatment may vary due to age or medical condition  Airway Management Planned: LMA  Additional Equipment:   Intra-op Plan:   Post-operative Plan: Extubation in OR  Informed Consent: I have reviewed the patients History and Physical, chart, labs and discussed the procedure including the risks, benefits and alternatives for the proposed anesthesia with the patient or authorized representative who has indicated his/her understanding and acceptance.   Dental advisory given  Plan Discussed with: CRNA  Anesthesia Plan Comments: (?TIVA)       Anesthesia Quick Evaluation

## 2016-11-12 ENCOUNTER — Encounter (HOSPITAL_BASED_OUTPATIENT_CLINIC_OR_DEPARTMENT_OTHER)
Admission: RE | Disposition: A | Discharge status: Home / Self Care | Payer: Self-pay | Source: Ambulatory Visit | Attending: General Surgery

## 2016-11-12 ENCOUNTER — Ambulatory Visit (HOSPITAL_BASED_OUTPATIENT_CLINIC_OR_DEPARTMENT_OTHER)
Admission: RE | Admit: 2016-11-12 | Discharge: 2016-11-13 | Disposition: A | Discharge status: Home / Self Care | Payer: Medicare Other | Source: Ambulatory Visit | Attending: General Surgery | Admitting: General Surgery

## 2016-11-12 ENCOUNTER — Ambulatory Visit (HOSPITAL_BASED_OUTPATIENT_CLINIC_OR_DEPARTMENT_OTHER): Payer: Medicare Other | Admitting: Anesthesiology

## 2016-11-12 ENCOUNTER — Encounter (HOSPITAL_BASED_OUTPATIENT_CLINIC_OR_DEPARTMENT_OTHER): Payer: Self-pay | Admitting: *Deleted

## 2016-11-12 DIAGNOSIS — M810 Age-related osteoporosis without current pathological fracture: Secondary | ICD-10-CM | POA: Insufficient documentation

## 2016-11-12 DIAGNOSIS — M1990 Unspecified osteoarthritis, unspecified site: Secondary | ICD-10-CM | POA: Insufficient documentation

## 2016-11-12 DIAGNOSIS — Z888 Allergy status to other drugs, medicaments and biological substances status: Secondary | ICD-10-CM | POA: Diagnosis not present

## 2016-11-12 DIAGNOSIS — C50912 Malignant neoplasm of unspecified site of left female breast: Secondary | ICD-10-CM

## 2016-11-12 DIAGNOSIS — Z87891 Personal history of nicotine dependence: Secondary | ICD-10-CM | POA: Insufficient documentation

## 2016-11-12 DIAGNOSIS — Z882 Allergy status to sulfonamides status: Secondary | ICD-10-CM | POA: Diagnosis not present

## 2016-11-12 DIAGNOSIS — Z79899 Other long term (current) drug therapy: Secondary | ICD-10-CM | POA: Insufficient documentation

## 2016-11-12 DIAGNOSIS — E039 Hypothyroidism, unspecified: Secondary | ICD-10-CM | POA: Diagnosis not present

## 2016-11-12 DIAGNOSIS — Z96652 Presence of left artificial knee joint: Secondary | ICD-10-CM | POA: Insufficient documentation

## 2016-11-12 DIAGNOSIS — K219 Gastro-esophageal reflux disease without esophagitis: Secondary | ICD-10-CM | POA: Diagnosis not present

## 2016-11-12 DIAGNOSIS — Z8249 Family history of ischemic heart disease and other diseases of the circulatory system: Secondary | ICD-10-CM | POA: Diagnosis not present

## 2016-11-12 DIAGNOSIS — I10 Essential (primary) hypertension: Secondary | ICD-10-CM | POA: Insufficient documentation

## 2016-11-12 DIAGNOSIS — D0512 Intraductal carcinoma in situ of left breast: Secondary | ICD-10-CM | POA: Diagnosis present

## 2016-11-12 DIAGNOSIS — N6022 Fibroadenosis of left breast: Secondary | ICD-10-CM | POA: Diagnosis not present

## 2016-11-12 DIAGNOSIS — Z170 Estrogen receptor positive status [ER+]: Secondary | ICD-10-CM

## 2016-11-12 HISTORY — PX: TOTAL MASTECTOMY: SHX6129

## 2016-11-12 LAB — POCT I-STAT, CHEM 8
BUN: 38 mg/dL — AB (ref 6–20)
CREATININE: 1.4 mg/dL — AB (ref 0.44–1.00)
Calcium, Ion: 1.2 mmol/L (ref 1.15–1.40)
Chloride: 102 mmol/L (ref 101–111)
Glucose, Bld: 110 mg/dL — ABNORMAL HIGH (ref 65–99)
HEMATOCRIT: 46 % (ref 36.0–46.0)
HEMOGLOBIN: 15.6 g/dL — AB (ref 12.0–15.0)
POTASSIUM: 3.8 mmol/L (ref 3.5–5.1)
SODIUM: 140 mmol/L (ref 135–145)
TCO2: 29 mmol/L (ref 0–100)

## 2016-11-12 SURGERY — MASTECTOMY, SIMPLE
Anesthesia: Regional | Site: Breast | Laterality: Left

## 2016-11-12 MED ORDER — MEPERIDINE HCL 25 MG/ML IJ SOLN: 6.2500 mg | INTRAMUSCULAR | Status: DC | PRN

## 2016-11-12 MED ORDER — ONDANSETRON HCL 4 MG/2ML IJ SOLN
INTRAMUSCULAR | Status: DC | PRN
  Administered 2016-11-12: 4 mg via INTRAVENOUS

## 2016-11-12 MED ORDER — LIDOCAINE HCL (CARDIAC) 20 MG/ML IV SOLN
INTRAVENOUS | Status: AC
  Filled 2016-11-12: qty 5

## 2016-11-12 MED ORDER — CHLORHEXIDINE GLUCONATE CLOTH 2 % EX PADS: 6.0000 | MEDICATED_PAD | Freq: Once | CUTANEOUS | Status: DC

## 2016-11-12 MED ORDER — EPHEDRINE SULFATE 50 MG/ML IJ SOLN
INTRAMUSCULAR | Status: DC | PRN
  Administered 2016-11-12 (×3): 10 mg via INTRAVENOUS

## 2016-11-12 MED ORDER — MORPHINE SULFATE (PF) 2 MG/ML IV SOLN: 1.0000 mg | INTRAVENOUS | Status: DC | PRN

## 2016-11-12 MED ORDER — MIDAZOLAM HCL 2 MG/2ML IJ SOLN
INTRAMUSCULAR | Status: AC
Start: 2016-11-12 — End: 2016-11-12
  Filled 2016-11-12: qty 2

## 2016-11-12 MED ORDER — PROPOFOL 500 MG/50ML IV EMUL
INTRAVENOUS | Status: DC | PRN
  Administered 2016-11-12: 08:00:00 via INTRAVENOUS
  Administered 2016-11-12: 75 ug/kg/min via INTRAVENOUS

## 2016-11-12 MED ORDER — DEXAMETHASONE SODIUM PHOSPHATE 4 MG/ML IJ SOLN
INTRAMUSCULAR | Status: DC | PRN
  Administered 2016-11-12: 10 mg via INTRAVENOUS

## 2016-11-12 MED ORDER — LIDOCAINE HCL (CARDIAC) 20 MG/ML IV SOLN
INTRAVENOUS | Status: DC | PRN
  Administered 2016-11-12: 30 mg via INTRAVENOUS

## 2016-11-12 MED ORDER — DEXAMETHASONE SODIUM PHOSPHATE 10 MG/ML IJ SOLN
INTRAMUSCULAR | Status: AC
  Filled 2016-11-12: qty 1

## 2016-11-12 MED ORDER — ONDANSETRON HCL 4 MG/2ML IJ SOLN
INTRAMUSCULAR | Status: AC
  Filled 2016-11-12: qty 10

## 2016-11-12 MED ORDER — PANTOPRAZOLE SODIUM 40 MG PO TBEC: 40.0000 mg | DELAYED_RELEASE_TABLET | Freq: Every day | ORAL | Status: DC

## 2016-11-12 MED ORDER — BUPIVACAINE-EPINEPHRINE (PF) 0.5% -1:200000 IJ SOLN
INTRAMUSCULAR | Status: AC
  Filled 2016-11-12: qty 30

## 2016-11-12 MED ORDER — PROMETHAZINE HCL 25 MG/ML IJ SOLN: 6.2500 mg | INTRAMUSCULAR | Status: DC | PRN

## 2016-11-12 MED ORDER — FENTANYL CITRATE (PF) 100 MCG/2ML IJ SOLN
50.0000 ug | INTRAMUSCULAR | Status: DC | PRN
  Administered 2016-11-12: 50 ug via INTRAVENOUS
  Administered 2016-11-12: 25 ug via INTRAVENOUS

## 2016-11-12 MED ORDER — LEVOTHYROXINE SODIUM 75 MCG PO TABS: 75.0000 ug | ORAL_TABLET | Freq: Every day | ORAL | Status: DC

## 2016-11-12 MED ORDER — SUCCINYLCHOLINE CHLORIDE 200 MG/10ML IV SOSY
PREFILLED_SYRINGE | INTRAVENOUS | Status: AC
  Filled 2016-11-12: qty 20

## 2016-11-12 MED ORDER — ONDANSETRON HCL 4 MG/2ML IJ SOLN
INTRAMUSCULAR | Status: AC
  Filled 2016-11-12: qty 2

## 2016-11-12 MED ORDER — ENOXAPARIN SODIUM 40 MG/0.4ML ~~LOC~~ SOLN: 40.0000 mg | SUBCUTANEOUS | Status: DC

## 2016-11-12 MED ORDER — ACETAMINOPHEN 500 MG PO TABS
ORAL_TABLET | ORAL | Status: AC
  Filled 2016-11-12: qty 2

## 2016-11-12 MED ORDER — FENTANYL CITRATE (PF) 100 MCG/2ML IJ SOLN
INTRAMUSCULAR | Status: AC
  Filled 2016-11-12: qty 2

## 2016-11-12 MED ORDER — GABAPENTIN 300 MG PO CAPS
300.0000 mg | ORAL_CAPSULE | ORAL | Status: AC
  Administered 2016-11-12: 300 mg via ORAL

## 2016-11-12 MED ORDER — GABAPENTIN 300 MG PO CAPS
ORAL_CAPSULE | ORAL | Status: AC
  Filled 2016-11-12: qty 1

## 2016-11-12 MED ORDER — PROPOFOL 10 MG/ML IV BOLUS
INTRAVENOUS | Status: DC | PRN
  Administered 2016-11-12: 100 mg via INTRAVENOUS

## 2016-11-12 MED ORDER — CEFAZOLIN SODIUM-DEXTROSE 2-4 GM/100ML-% IV SOLN
2.0000 g | INTRAVENOUS | Status: AC
  Administered 2016-11-12: 2 g via INTRAVENOUS

## 2016-11-12 MED ORDER — 0.9 % SODIUM CHLORIDE (POUR BTL) OPTIME
TOPICAL | Status: DC | PRN
  Administered 2016-11-12: 1000 mL

## 2016-11-12 MED ORDER — SCOPOLAMINE 1 MG/3DAYS TD PT72: 1.0000 | MEDICATED_PATCH | Freq: Once | TRANSDERMAL | Status: DC | PRN

## 2016-11-12 MED ORDER — FENTANYL CITRATE (PF) 100 MCG/2ML IJ SOLN
25.0000 ug | INTRAMUSCULAR | Status: DC | PRN
  Administered 2016-11-12: 25 ug via INTRAVENOUS

## 2016-11-12 MED ORDER — ACETAMINOPHEN 500 MG PO TABS
1000.0000 mg | ORAL_TABLET | ORAL | Status: AC
  Administered 2016-11-12: 1000 mg via ORAL

## 2016-11-12 MED ORDER — OLMESARTAN MEDOXOMIL-HCTZ 40-25 MG PO TABS: 1.0000 | ORAL_TABLET | Freq: Every day | ORAL | Status: DC

## 2016-11-12 MED ORDER — ONDANSETRON HCL 4 MG/2ML IJ SOLN: 4.0000 mg | Freq: Four times a day (QID) | INTRAMUSCULAR | Status: DC | PRN

## 2016-11-12 MED ORDER — MIDAZOLAM HCL 2 MG/2ML IJ SOLN: 1.0000 mg | INTRAMUSCULAR | Status: DC | PRN

## 2016-11-12 MED ORDER — CEFAZOLIN SODIUM-DEXTROSE 2-4 GM/100ML-% IV SOLN
INTRAVENOUS | Status: AC
  Filled 2016-11-12: qty 100

## 2016-11-12 MED ORDER — CLONIDINE HCL 0.1 MG PO TABS: 0.1000 mg | ORAL_TABLET | Freq: Two times a day (BID) | ORAL | Status: DC

## 2016-11-12 MED ORDER — LACTATED RINGERS IV SOLN
INTRAVENOUS | Status: DC
  Administered 2016-11-12: 11:00:00 via INTRAVENOUS

## 2016-11-12 MED ORDER — BUPIVACAINE-EPINEPHRINE (PF) 0.5% -1:200000 IJ SOLN
INTRAMUSCULAR | Status: DC | PRN
  Administered 2016-11-12: 30 mL

## 2016-11-12 MED ORDER — LACTATED RINGERS IV SOLN
INTRAVENOUS | Status: DC
  Administered 2016-11-12 (×2): via INTRAVENOUS

## 2016-11-12 MED ORDER — ONDANSETRON 4 MG PO TBDP
4.0000 mg | ORAL_TABLET | Freq: Four times a day (QID) | ORAL | Status: DC | PRN
Start: 2016-11-12 — End: 2016-11-13

## 2016-11-12 MED ORDER — TRAMADOL HCL 50 MG PO TABS
50.0000 mg | ORAL_TABLET | Freq: Four times a day (QID) | ORAL | Status: DC | PRN
Start: 2016-11-12 — End: 2016-11-13
  Administered 2016-11-12: 50 mg via ORAL
  Filled 2016-11-12: qty 1

## 2016-11-12 MED ORDER — HYDROCODONE-ACETAMINOPHEN 5-325 MG PO TABS
1.0000 | ORAL_TABLET | ORAL | Status: DC | PRN
  Administered 2016-11-12 (×2): 1 via ORAL
  Filled 2016-11-12 (×2): qty 1

## 2016-11-12 SURGICAL SUPPLY — 49 items
BENZOIN TINCTURE PRP APPL 2/3 (GAUZE/BANDAGES/DRESSINGS) IMPLANT
BINDER BREAST XLRG (GAUZE/BANDAGES/DRESSINGS) ×2 IMPLANT
BLADE SURG 10 STRL SS (BLADE) ×2 IMPLANT
CANISTER SUCT 1200ML W/VALVE (MISCELLANEOUS) ×4 IMPLANT
CHLORAPREP W/TINT 26ML (MISCELLANEOUS) ×2 IMPLANT
COVER BACK TABLE 60X90IN (DRAPES) ×2 IMPLANT
COVER MAYO STAND STRL (DRAPES) ×2 IMPLANT
DERMABOND ADVANCED (GAUZE/BANDAGES/DRESSINGS) ×1
DERMABOND ADVANCED .7 DNX12 (GAUZE/BANDAGES/DRESSINGS) ×1 IMPLANT
DEVICE DISSECT PLASMABLAD 3.0S (MISCELLANEOUS) ×1 IMPLANT
DRAIN CHANNEL 19F RND (DRAIN) ×2 IMPLANT
DRAPE LAPAROSCOPIC ABDOMINAL (DRAPES) ×2 IMPLANT
DRAPE SURG 17X23 STRL (DRAPES) IMPLANT
DRAPE UTILITY XL STRL (DRAPES) ×2 IMPLANT
ELECT REM PT RETURN 9FT ADLT (ELECTROSURGICAL) ×2
ELECTRODE REM PT RTRN 9FT ADLT (ELECTROSURGICAL) ×1 IMPLANT
EVACUATOR SILICONE 100CC (DRAIN) ×2 IMPLANT
GAUZE SPONGE 4X4 12PLY STRL LF (GAUZE/BANDAGES/DRESSINGS) ×2 IMPLANT
GAUZE VASELINE 3X9 (GAUZE/BANDAGES/DRESSINGS) ×2 IMPLANT
GLOVE BIO SURGEON STRL SZ 6.5 (GLOVE) ×4 IMPLANT
GLOVE BIO SURGEON STRL SZ7 (GLOVE) ×2 IMPLANT
GLOVE BIOGEL PI IND STRL 7.0 (GLOVE) ×2 IMPLANT
GLOVE BIOGEL PI IND STRL 8 (GLOVE) ×1 IMPLANT
GLOVE BIOGEL PI INDICATOR 7.0 (GLOVE) ×2
GLOVE BIOGEL PI INDICATOR 8 (GLOVE) ×1
GLOVE ECLIPSE 7.5 STRL STRAW (GLOVE) ×2 IMPLANT
GLOVE EXAM NITRILE LRG STRL (GLOVE) ×2 IMPLANT
GOWN STRL REUS W/ TWL XL LVL3 (GOWN DISPOSABLE) ×3 IMPLANT
GOWN STRL REUS W/TWL XL LVL3 (GOWN DISPOSABLE) ×3
LIGHT WAVEGUIDE WIDE FLAT (MISCELLANEOUS) IMPLANT
NS IRRIG 1000ML POUR BTL (IV SOLUTION) ×2 IMPLANT
PACK BASIN DAY SURGERY FS (CUSTOM PROCEDURE TRAY) ×2 IMPLANT
PIN SAFETY STERILE (MISCELLANEOUS) ×2 IMPLANT
PLASMABLADE 3.0S (MISCELLANEOUS) ×2
SLEEVE SCD COMPRESS KNEE MED (MISCELLANEOUS) ×2 IMPLANT
SPONGE LAP 18X18 X RAY DECT (DISPOSABLE) ×6 IMPLANT
STRIP CLOSURE SKIN 1/2X4 (GAUZE/BANDAGES/DRESSINGS) IMPLANT
SUT ETHILON 3 0 PS 1 (SUTURE) ×2 IMPLANT
SUT MON AB 4-0 PC3 18 (SUTURE) ×2 IMPLANT
SUT SILK 2 0 SH (SUTURE) IMPLANT
SUT VIC AB 3-0 54X BRD REEL (SUTURE) ×1 IMPLANT
SUT VIC AB 3-0 BRD 54 (SUTURE) ×1
SUT VIC AB 3-0 SH 27 (SUTURE) ×2
SUT VIC AB 3-0 SH 27X BRD (SUTURE) ×2 IMPLANT
SUT VICRYL 3-0 CR8 SH (SUTURE) ×4 IMPLANT
TOWEL OR 17X24 6PK STRL BLUE (TOWEL DISPOSABLE) ×2 IMPLANT
TOWEL OR NON WOVEN STRL DISP B (DISPOSABLE) ×2 IMPLANT
TUBE CONNECTING 20X1/4 (TUBING) ×4 IMPLANT
YANKAUER SUCT BULB TIP NO VENT (SUCTIONS) ×2 IMPLANT

## 2016-11-12 NOTE — Anesthesia Postprocedure Evaluation (Signed)
Anesthesia Post Note  Patient: Darlene Gregory  Procedure(s) Performed: Procedure(s) (LRB): LEFT TOTAL MASTECTOMY (Left)     Patient location during evaluation: PACU Anesthesia Type: Regional and General Level of consciousness: sedated and patient cooperative Pain management: pain level controlled Vital Signs Assessment: post-procedure vital signs reviewed and stable Respiratory status: spontaneous breathing Cardiovascular status: stable Anesthetic complications: no    Last Vitals:  Vitals:   11/12/16 1045 11/12/16 1500  BP: (!) 164/75 (!) 142/72  Pulse: 60 62  Resp: 14 16  Temp: (!) 36.1 C (!) 36.2 C    Last Pain:  Vitals:   11/12/16 1500  TempSrc:   PainSc: 2                  Nolon Nations

## 2016-11-12 NOTE — Transfer of Care (Signed)
Immediate Anesthesia Transfer of Care Note  Patient: Darlene Gregory  Procedure(s) Performed: Procedure(s): LEFT TOTAL MASTECTOMY (Left)  Patient Location: PACU  Anesthesia Type:GA combined with regional for post-op pain  Level of Consciousness: awake, alert , oriented and patient cooperative  Airway & Oxygen Therapy: Patient Spontanous Breathing and Patient connected to face mask oxygen  Post-op Assessment: Report given to RN and Post -op Vital signs reviewed and stable  Post vital signs: Reviewed and stable  Last Vitals:  Vitals:   11/12/16 0726 11/12/16 0909  BP:    Pulse: 65 65  Resp: 18 11  Temp:      Last Pain:  Vitals:   11/12/16 0638  TempSrc: Oral  PainSc: 0-No pain         Complications: No apparent anesthesia complications

## 2016-11-12 NOTE — Anesthesia Procedure Notes (Signed)
Anesthesia Regional Block: Pectoralis block   Pre-Anesthetic Checklist: ,, timeout performed, Correct Patient, Correct Site, Correct Laterality, Correct Procedure, Correct Position, site marked, Risks and benefits discussed,  Surgical consent,  Pre-op evaluation,  At surgeon's request and post-op pain management  Laterality: Left  Prep: chloraprep       Needles:   Needle Type: Stimiplex     Needle Length: 9cm      Additional Needles:   Procedures: ultrasound guided,,,,,,,,  Narrative:  Start time: 11/12/2016 7:14 AM End time: 11/12/2016 7:19 AM Injection made incrementally with aspirations every 5 mL.  Performed by: Personally  Anesthesiologist: Nolon Nations  Additional Notes: Patient tolerated well. Good fascial spread noted.

## 2016-11-12 NOTE — Anesthesia Procedure Notes (Signed)
Procedure Name: LMA Insertion Date/Time: 11/12/2016 7:37 AM Performed by: Huxton Glaus D Pre-anesthesia Checklist: Patient identified, Emergency Drugs available, Suction available and Patient being monitored Patient Re-evaluated:Patient Re-evaluated prior to inductionOxygen Delivery Method: Circle system utilized Preoxygenation: Pre-oxygenation with 100% oxygen Intubation Type: IV induction Ventilation: Mask ventilation without difficulty LMA: LMA inserted LMA Size: 3.0 Number of attempts: 1 Airway Equipment and Method: Bite block Placement Confirmation: positive ETCO2 Tube secured with: Tape Dental Injury: Teeth and Oropharynx as per pre-operative assessment

## 2016-11-12 NOTE — H&P (Signed)
Darlene Gregory is an 83 y.o. female.    HPI: Patient status post recent left breast lumpectomy for invasive ductal carcinoma of the left breast. Despite apparent accurate imaging at her lumpectomy at the time of surgery follow-up pathology showed a broadly positive inferior margin. Re excision has revealed at least a 4 cm area of DCIS with again a positive margin, none of this suspected on imaging with her initial mass measuring 5 mm.  After discussion regarding options she has decided on total mastectomy.  Past Medical History:  Diagnosis Date  . Cancer ([place]) 10/2016   left breast cancer  . Difficult intubation    "small trachea"; 01/18/05: glidescope but unable to pass stylet -->fast track LMA with blind passage for ETT, consider awake intubation; 08/14/08: IV induction and DL with glidesecope for ETT  . DJD (degenerative joint disease)   . Frequent urination   . GERD (gastroesophageal reflux disease)   . Hypertension   . Hypothyroidism   . Osteoporosis   . PONV (postoperative nausea and vomiting)   . Thyroid disease     Past Surgical History:  Procedure Laterality Date  . ABDOMINAL HYSTERECTOMY    . BREAST LUMPECTOMY WITH RADIOACTIVE SEED LOCALIZATION Left 10/29/2016   Procedure: LEFT BREAST LUMPECTOMY WITH RADIOACTIVE SEED LOCALIZATION;  Surgeon: Excell Seltzer, MD;  Location: [facility];  Service: General;  Laterality: Left;  . CAROTID BODY TUMOR EXCISION    . COLONOSCOPY W/ POLYPECTOMY    . EYE SURGERY Bilateral    cataract removed  . JOINT REPLACEMENT Right 2006   knee  . PAROTIDECTOMY Left   . RE-EXCISION OF BREAST LUMPECTOMY Left 11/05/2016   Procedure: RE-EXCISION OF LEFT BREAST LUMPECTOMY;  Surgeon: Excell Seltzer, MD;  Location: [facility];  Service: General;  Laterality: Left;  . TOTAL KNEE ARTHROPLASTY Left 03/13/2014   Procedure: LEFT TOTAL KNEE ARTHROPLASTY;  Surgeon: Kerin Salen, MD;  Location: [facility];  Service: Orthopedics;   Laterality: Left;    Family History  Problem Relation Age of Onset  . Heart disease Mother   . Emphysema Father    Social History:  reports that she has quit smoking. She has never used smokeless tobacco. She reports that she does not drink alcohol or use drugs.  Allergies:  Allergies  Allergen Reactions  . Hyoscyamine Sulfate     REACTION: rash  . Norvasc [Amlodipine Besylate] Swelling  . Phenazopyridine Hcl Rash  . Sulfonamide Derivatives Rash    Medications Prior to Admission  Medication Sig Dispense Refill  . Cholecalciferol (VITAMIN D) 2000 UNITS CAPS Take by mouth daily.    . cloNIDine (CATAPRES) 0.1 MG tablet Take 1 tablet (0.1 mg total) by mouth 2 (two) times daily. 180 tablet 1  . FIBER PO Take 2 each by mouth daily.     . fish oil-omega-3 fatty acids 1000 MG capsule Take 1 g by mouth daily.    Marland Kitchen levothyroxine (SYNTHROID, LEVOTHROID) 75 MCG tablet Take 1 tablet (75 mcg total) by mouth daily. 90 tablet 1  . Multiple Vitamins-Minerals (ICAPS PO) Take 1 tablet by mouth daily.     Marland Kitchen olmesartan-hydrochlorothiazide (BENICAR HCT) 40-25 MG tablet Take 1 tablet by mouth daily. 30 tablet 5  . omeprazole (PRILOSEC) 20 MG capsule Take 1 capsule (20 mg total) by mouth daily. 90 capsule 1  . traMADol (ULTRAM) 50 MG tablet Take 1 tablet (50 mg total) by mouth every 6 (six) hours as needed for moderate pain. 10 tablet 1  .  vitamin C (ASCORBIC ACID) 500 MG tablet Take 500 mg by mouth daily.      Results for orders placed or performed during the hospital encounter of 11/12/16 (from the past 48 hour(s))  I-STAT, chem 8     Status: Abnormal   Collection Time: 11/12/16  6:57 AM  Result Value Ref Range   Sodium 140 135 - 145 mmol/L   Potassium 3.8 3.5 - 5.1 mmol/L   Chloride 102 101 - 111 mmol/L   BUN 38 (H) 6 - 20 mg/dL   Creatinine, Ser 1.40 (H) 0.44 - 1.00 mg/dL   Glucose, Bld 110 (H) 65 - 99 mg/dL   Calcium, Ion 1.20 1.15 - 1.40 mmol/L   TCO2 29 0 - 100 mmol/L   Hemoglobin 15.6  (H) 12.0 - 15.0 g/dL   HCT 46.0 36.0 - 46.0 %   No results found.  Review of Systems  Constitutional: Negative.  Negative for chills and fever.  Respiratory: Negative.  Negative for shortness of breath.   Cardiovascular: Negative.  Negative for chest pain.    Blood pressure (!) 153/64, pulse (!) 55, temperature 97.6 F (36.4 C), temperature source Oral, resp. rate 18, height 5\' 3"  (1.6 m), weight 82.8 kg (182 lb 9.6 oz), SpO2 100 %. Physical Exam  General: Alert, well-developed elderly Caucasian female, in no distress Skin: Warm and dry without rash or infection. HEENT: No palpable masses or thyromegaly. Sclera nonicteric.  Lymph nodes: No cervical, supraclavicular, or inguinal nodes palpable. Breasts: Healing left breast lumpectomy without evidence of infection Lungs: Breath sounds clear and equal without increased work of breathing Cardiovascular: Regular rate and rhythm without murmur.  Extremities: No edema or joint swelling or deformity. No chronic venous stasis changes. Neurologic: Alert and fully oriented. Gait normal.  Assessment/Plan Cancer left breast, status post recent lumpectomy with positive inferior margin and re excision with 4 cm area of DCIS and further positive margin. She has elected total mastectomy and presents for this procedure.  We discussed risks of anesthesia, bleeding, ifection, wound healing and use of drains.   Edward Jolly, MD 11/12/2016, 7:18 AM

## 2016-11-12 NOTE — Op Note (Signed)
Preoperative Diagnosis: LEFT BREAST CANCER  Postoprative Diagnosis: LEFT BREAST CANCER  Procedure: Procedure(s): LEFT TOTAL MASTECTOMY   Surgeon: Excell Seltzer T   Assistants: None  Anesthesia:  General LMA anesthesia  Indications: Patient had a recent abnormal mammogram showing a 5 mm mass in the upper outer left breast. Biopsy showed invasive and in situ carcinoma. She subsequently has had 2 left breast lumpectomies with a total of 5 cm of DCIS within the specimens and still has a positive inferior margin on her last lumpectomy. After discussion of options including further lumpectomy versus mastectomy patient has opted to proceed with left total mastectomy. The indications and procedure and risks have been discussed with the patient and the family and detailed elsewhere.    Procedure Detail:  Patient was brought to the operating room, placed in supine position on the operating table, and laryngeal mask general anesthesia induced. She was carefully positioned with her left arm extended and the entire left anterior chest axilla and upper arm and neck were widely sterilely prepped and draped. She received preoperative IV antibiotics. PAS were in place. Patient timeout was performed and correct procedure verified. A transverse elliptical incision was used encompassing the nipple areolar complex and the previous lumpectomy site in the upper outer breast. Skin and subcutaneous flaps were then raised superiorly to the medial edge of the sternum, superiorly to the clavicle, inferiorly to the inframammary crease and laterally out to the anterior border of the latissimus. The breast was then reflected up off of the chest wall working medial to lateral detaching from the lateral aspect of the pectoralis major and from the serratus and then posteriorly detaching from the latissimus working up until the specimen was attached only through the axillary contents. I then came across the low axilla with Kelly  clamps and the specimen was removed and oriented and sent for permanent pathology. Axillary tissue was ligated. The wound was thoroughly irrigated and complete hemostasis assured. A 19 Blake suction drain was left through a separate stab wound and placed under both flaps. Subcutaneous tissue was closed with interrupted 3-0 Vicryl and the skin with running subcuticular 4-0 Monocryl and Dermabond. Sponge needle and is to make counts were correct.    Findings: As above  Estimated Blood Loss:  less than 50 mL         Drains: 19 round Blake drain  Blood Given: none          Specimens: Left total mastectomy        Complications:  * No complications entered in OR log *         Disposition: PACU - hemodynamically stable.         Condition: stable

## 2016-11-12 NOTE — Discharge Instructions (Signed)
About my Jackson-Pratt Bulb Drain  What is a Jackson-Pratt bulb? A Jackson-Pratt is a soft, round device used to collect drainage. It is connected to a long, thin drainage catheter, which is held in place by one or two small stiches near your surgical incision site. When the bulb is squeezed, it forms a vacuum, forcing the drainage to empty into the bulb.  Emptying the Jackson-Pratt bulb- To empty the bulb: 1. Release the plug on the top of the bulb. 2. Pour the bulb's contents into a measuring container which your nurse will provide. 3. Record the time emptied and amount of drainage. Empty the drain(s) as often as your     doctor or nurse recommends.  Date                  Time                    Amount (Drain 1)                 Amount (Drain 2)  _____________________________________________________________________  _____________________________________________________________________  _____________________________________________________________________  _____________________________________________________________________  _____________________________________________________________________  _____________________________________________________________________  _____________________________________________________________________  _____________________________________________________________________  Squeezing the Jackson-Pratt Bulb- To squeeze the bulb: 1. Make sure the plug at the top of the bulb is open. 2. Squeeze the bulb tightly in your fist. You will hear air squeezing from the bulb. 3. Replace the plug while the bulb is squeezed. 4. Use a safety pin to attach the bulb to your clothing. This will keep the catheter from     pulling at the bulb insertion site.  When to call your doctor- Call your doctor if:  Drain site becomes red, swollen or hot.  You have a fever greater than 101 degrees F.  There is oozing at the drain site.  Drain falls out (apply a guaze  bandage over the drain hole and secure it with tape).  Drainage increases daily not related to activity patterns. (You will usually have more drainage when you are active than when you are resting.)  Drainage has a bad odor.  [facility] [phone]  MASTECTOMY: POST OP INSTRUCTIONS  Always review your discharge instruction sheet given to you by the facility where your surgery was performed. IF YOU HAVE DISABILITY OR FAMILY LEAVE FORMS, YOU MUST BRING THEM TO THE OFFICE FOR PROCESSING.   DO NOT GIVE THEM TO YOUR DOCTOR. A prescription for pain medication may be given to you upon discharge.  Take your pain medication as prescribed, if needed.  If narcotic pain medicine is not needed, then you may take acetaminophen (Tylenol) or ibuprofen (Advil) as needed. 1. Take your usually prescribed medications unless otherwise directed. 2. If you need a refill on your pain medication, please contact your pharmacy.  They will contact our office to request authorization.  Prescriptions will not be filled after 5pm or on week-ends. 3. You should follow a light diet the first few days after arrival home, such as soup and crackers, etc.  Resume your normal diet the day after surgery. 4. Most patients will experience some swelling and bruising on the chest and underarm.  Ice packs will help.  Swelling and bruising can take several days to resolve.  5. It is common to experience some constipation if taking pain medication after surgery.  Increasing fluid intake and taking a stool softener (such as Colace) will usually help or prevent this problem from occurring.  A mild laxative (Milk of Magnesia or Miralax) should be  taken according to package instructions if there are no bowel movements after 48 hours. 6. Unless discharge instructions indicate otherwise, leave your bandage dry and in place until your next appointment in 3-5 days.  You may take a limited sponge bath.  No tube baths or  showers until the drains are removed.  You may have steri-strips (small skin tapes) in place directly over the incision.  These strips should be left on the skin for 7-10 days.  If your surgeon used skin glue on the incision, you may shower in 24 hours.  The glue will flake off over the next 2-3 weeks.  Any sutures or staples will be removed at the office during your follow-up visit. 7. DRAINS:  If you have drains in place, it is important to keep a list of the amount of drainage produced each day in your drains.  Before leaving the hospital, you should be instructed on drain care.  Call our office if you have any questions about your drains. 8. ACTIVITIES:  You may resume regular (light) daily activities beginning the next day--such as daily self-care, walking, climbing stairs--gradually increasing activities as tolerated.  You may have sexual intercourse when it is comfortable.  Refrain from any heavy lifting or straining until approved by your doctor. a. You may drive when you are no longer taking prescription pain medication, you can comfortably wear a seatbelt, and you can safely maneuver your car and apply brakes. b. RETURN TO WORK:  __________________________________________________________ 9. You should see your doctor in the office for a follow-up appointment approximately 3-5 days after your surgery.  Your doctors nurse will typically make your follow-up appointment when she calls you with your pathology report.  Expect your pathology report 2-3 business days after your surgery.  You may call to check if you do not hear from [place] after three days.   10. OTHER INSTRUCTIONS: ______________________________________________________________________________________________ ____________________________________________________________________________________________ WHEN TO CALL YOUR DOCTOR: 1. Fever over 101.0 2. Nausea and/or vomiting 3. Extreme swelling or bruising 4. Continued bleeding from  incision. 5. Increased pain, redness, or drainage from the incision. The clinic staff is available to answer your questions during regular business hours.  Please dont hesitate to call and ask to speak to one of the nurses for clinical concerns.  If you have a medical emergency, go to the nearest emergency room or call 911.  A surgeon from [facility] is always on call at the hospital. [address], [place], [place], [place]  [address] ? [address], [place], [place]   [address] [phone] ? [phone] ? FAX [phone] Web site: [email or web address]

## 2016-11-12 NOTE — Progress Notes (Addendum)
Assisted Dr. Germeroth with left, ultrasound guided, pectoralis block. Side rails up, monitors on throughout procedure. See vital signs in flow sheet. Tolerated Procedure well. 

## 2016-11-15 ENCOUNTER — Encounter (HOSPITAL_BASED_OUTPATIENT_CLINIC_OR_DEPARTMENT_OTHER): Payer: Self-pay | Admitting: General Surgery

## 2016-11-29 ENCOUNTER — Encounter: Payer: Self-pay | Admitting: *Deleted

## 2016-11-29 ENCOUNTER — Other Ambulatory Visit: Payer: Self-pay

## 2016-11-29 DIAGNOSIS — Z170 Estrogen receptor positive status [ER+]: Principal | ICD-10-CM

## 2016-11-29 DIAGNOSIS — C50912 Malignant neoplasm of unspecified site of left female breast: Secondary | ICD-10-CM

## 2016-11-29 DIAGNOSIS — C50412 Malignant neoplasm of upper-outer quadrant of left female breast: Secondary | ICD-10-CM

## 2016-11-29 NOTE — Progress Notes (Signed)
[facility]  [phone] [phone]     ID: Darlene Gregory DOB: 12/23/1933  MR#: [id number]  VFI#:[id number]  Patient Care Team: Chevis Pretty, [place] as PCP - General (Nurse Practitioner) Excell Seltzer, MD as Consulting Physician (General Surgery) Chane Cowden, Virgie Dad, MD as Consulting Physician (Oncology) Eppie Gibson, MD as Attending Physician (Radiation Oncology) Arvella Nigh, MD as Consulting Physician (Obstetrics and Gynecology) Chauncey Cruel, MD OTHER MD:  CHIEF COMPLAINT: Estrogen receptor positive breast cancer  CURRENT TREATMENT: Tamoxifen   BREAST CANCER HISTORY: From the original intake note:   [name] had screening mammography April 2018 showing a possible mass in the left breast. She was referred to the [facility] where she underwent left mammography and ultrasonography on 09/10/2016. This showed the breast density to be category C. In the upper outer quadrant of the breast there was an area of architectural distortion which was not palpable. Ultrasonography confirmed a 0.9 cm hypoechoic mass at the 1:30 o'clock position of the left breast 3 cm from the nipple. Ultrasound of the left axilla showed a suspicious lymph node.  On 09/24/2016 the patient underwent biopsy of the left breast mass and the left axillary lymph node in question. The lymph node was benign, and this was read as concordant by radiology. The left breast mass however showed invasive ductal carcinoma, grade 2, estrogen receptor 100% positive, progesterone receptor 95% positive, both with strong staining intensity, with an MIB-1 of 5%, and no HER-2 amplification, the signals ratio being 1.46 and the number per cell 1.90.  The patient's subsequent history is as detailed below  INTERVAL HISTORY: Darlene Gregory returns today for follow-up and treatment of her estrogen receptor positive breast cancer accompanied by her husband Darlene Gregory and her son Darlene Gregory.. Since her last  visit here she underwent left lumpectomy on 10/29/2016. The final pathology ([id number]) showed invasive ductal carcinoma, measuring 1.2 cm, grade 2, with both the invasive and in situ components broadly involving the inferior margin.  Accordingly she underwent additional surgery 11/05/2016 for margin clearance. This showed ([id number]) multiple additional involvement of margins by ductal carcinoma in situ  Given this situation, the patient appropriately opted for left simple mastectomy and sentinel lymph node sampling and this was performed 11/12/2016. This showed ([id number]) no residual invasive disease and no cancer in both sentinel lymph nodes. There was additional ductal carcinoma in situ, but margins were now clear.    REVIEW OF SYSTEMS: She did well with her surgeries, and tells me that haven't been no mental status changes that she is aware of from the anesthesia. She had no problems with pain or fever. The drain was painful but it came out after 2 weeks. Otherwise a detailed review of systems today was stable PAST MEDICAL HISTORY: Past Medical History:  Diagnosis Date  . Cancer ([place]) 10/2016   left breast cancer  . Difficult intubation    "small trachea"; 01/18/05: glidescope but unable to pass stylet -->fast track LMA with blind passage for ETT, consider awake intubation; 08/14/08: IV induction and DL with glidesecope for ETT  . DJD (degenerative joint disease)   . Frequent urination   . GERD (gastroesophageal reflux disease)   . Hypertension   . Hypothyroidism   . Osteoporosis   . PONV (postoperative nausea and vomiting)   . Thyroid disease     PAST SURGICAL HISTORY: Past Surgical History:  Procedure Laterality Date  . ABDOMINAL HYSTERECTOMY    . BREAST LUMPECTOMY WITH RADIOACTIVE SEED LOCALIZATION Left 10/29/2016  Procedure: LEFT BREAST LUMPECTOMY WITH RADIOACTIVE SEED LOCALIZATION;  Surgeon: Excell Seltzer, MD;  Location: [facility];  Service: General;   Laterality: Left;  . CAROTID BODY TUMOR EXCISION    . COLONOSCOPY W/ POLYPECTOMY    . EYE SURGERY Bilateral    cataract removed  . JOINT REPLACEMENT Right 2006   knee  . PAROTIDECTOMY Left   . RE-EXCISION OF BREAST LUMPECTOMY Left 11/05/2016   Procedure: RE-EXCISION OF LEFT BREAST LUMPECTOMY;  Surgeon: Excell Seltzer, MD;  Location: [facility];  Service: General;  Laterality: Left;  . TOTAL KNEE ARTHROPLASTY Left 03/13/2014   Procedure: LEFT TOTAL KNEE ARTHROPLASTY;  Surgeon: Kerin Salen, MD;  Location: [facility];  Service: Orthopedics;  Laterality: Left;  . TOTAL MASTECTOMY Left 11/12/2016   Procedure: LEFT TOTAL MASTECTOMY;  Surgeon: Excell Seltzer, MD;  Location: [facility];  Service: General;  Laterality: Left;    FAMILY HISTORY Family History  Problem Relation Age of Onset  . Heart disease Mother   . Emphysema Father   The patient's father died at the age of 67 from emphysema. The patient's mother died from "old age" at 2. The patient had one brother, 3 sisters. There is no history of breast cancer in the family. One sister had "a female cancer", but the patient is not sure what it may have been.  GYNECOLOGIC HISTORY:  No LMP recorded. Patient has had a hysterectomy. The patient is status post total abdominal hysterectomy with bilateral salpingo-oophorectomy remotely. She is GX P2, first live birth age 19. She never used oral contraceptives.  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 Her husband Darlene Gregory has been a Psychologist, sport and exercise. They currently have 41 acres. Son Darlene Gregory, 56, lives in [place] and works for [employer]. Son Darlene Gregory lives in [place] and works for [employer]. The patient has 2 grandchildren and some great-grandchildren. She is a Psychologist, forensic.    ADVANCED DIRECTIVES: In place   HEALTH MAINTENANCE: Social History  Substance Use Topics  . Smoking status: Former Research scientist (life sciences)  . Smokeless tobacco:  Never Used  . Alcohol use No     Colonoscopy: Repeat due 2019/[facility]  PAP:  Bone density: 04/20/2013, T score -2.3   Allergies  Allergen Reactions  . Hyoscyamine Sulfate     REACTION: rash  . Norvasc [Amlodipine Besylate] Swelling  . Phenazopyridine Hcl Rash  . Sulfonamide Derivatives Rash    Current Outpatient Prescriptions  Medication Sig Dispense Refill  . aspirin EC 81 MG tablet Take 1 tablet (81 mg total) by mouth daily. 100 tablet 4  . Cholecalciferol (VITAMIN D) 2000 UNITS CAPS Take by mouth daily.    . cloNIDine (CATAPRES) 0.1 MG tablet Take 1 tablet (0.1 mg total) by mouth 2 (two) times daily. 180 tablet 1  . FIBER PO Take 2 each by mouth daily.     . fish oil-omega-3 fatty acids 1000 MG capsule Take 1 g by mouth daily.    Marland Kitchen levothyroxine (SYNTHROID, LEVOTHROID) 75 MCG tablet Take 1 tablet (75 mcg total) by mouth daily. 90 tablet 1  . Multiple Vitamins-Minerals (ICAPS PO) Take 1 tablet by mouth daily.     Marland Kitchen olmesartan-hydrochlorothiazide (BENICAR HCT) 40-25 MG tablet Take 1 tablet by mouth daily. 30 tablet 5  . omeprazole (PRILOSEC) 20 MG capsule Take 1 capsule (20 mg total) by mouth daily. 90 capsule 1  . tamoxifen (NOLVADEX) 20 MG tablet Take 1 tablet (20 mg  total) by mouth daily. 90 tablet 12  . traMADol (ULTRAM) 50 MG tablet Take 1 tablet (50 mg total) by mouth every 6 (six) hours as needed for moderate pain. 10 tablet 1  . vitamin C (ASCORBIC ACID) 500 MG tablet Take 500 mg by mouth daily.     No current facility-administered medications for this visit.     OBJECTIVE: Elderly white woman In no acute distress Vitals:   11/30/16 0933  BP: (!) 143/78  Pulse: (!) 57  Resp: 18  Temp: 97.9 F (36.6 C)     Body mass index is 32.4 kg/m.    ECOG FS:1 - Symptomatic but completely ambulatory  Sclerae unicteric, pupils round and equal Oropharynx clear and moist No cervical or supraclavicular adenopathy Lungs no rales or rhonchi Heart regular rate and rhythm Abd  soft, nontender, positive bowel sounds MSK kyphosis but no focal spinal tenderness, no upper extremity lymphedema Neuro: nonfocal, well oriented, appropriate affect Breasts: The right breast is unremarkable. The left breast is status post mastectomy, with no dehiscence or swelling. Both axillae are benign.    LAB RESULTS:  CMP     Component Value Date/Time   NA 139 11/30/2016 0909   K 4.1 11/30/2016 0909   CL 102 11/12/2016 0657   CO2 27 11/30/2016 0909   GLUCOSE 99 11/30/2016 0909   BUN 38.0 (H) 11/30/2016 0909   CREATININE 1.8 (H) 11/30/2016 0909   CALCIUM 9.6 11/30/2016 0909   PROT 6.7 11/30/2016 0909   ALBUMIN 3.5 11/30/2016 0909   AST 15 11/30/2016 0909   ALT 9 11/30/2016 0909   ALKPHOS 105 11/30/2016 0909   BILITOT 0.44 11/30/2016 0909   GFRNONAA 27 (L) 09/28/2016 0915   GFRNONAA 49 (L) 11/24/2012 0823   GFRAA 31 (L) 09/28/2016 0915   GFRAA 56 (L) 11/24/2012 0823    No results found for: TOTALPROTELP, ALBUMINELP, A1GS, A2GS, BETS, BETA2SER, GAMS, MSPIKE, SPEI  No results found for: Nils Pyle, [facility]  Lab Results  Component Value Date   WBC 6.2 11/30/2016   NEUTROABS 3.6 11/30/2016   HGB 11.8 11/30/2016   HCT 35.1 11/30/2016   MCV 94.5 11/30/2016   PLT 187 11/30/2016      Chemistry      Component Value Date/Time   NA 139 11/30/2016 0909   K 4.1 11/30/2016 0909   CL 102 11/12/2016 0657   CO2 27 11/30/2016 0909   BUN 38.0 (H) 11/30/2016 0909   CREATININE 1.8 (H) 11/30/2016 0909      Component Value Date/Time   CALCIUM 9.6 11/30/2016 0909   ALKPHOS 105 11/30/2016 0909   AST 15 11/30/2016 0909   ALT 9 11/30/2016 0909   BILITOT 0.44 11/30/2016 0909       No results found for: LABCA2  No components found for: [id number]  No results for input(s): INR in the last 168 hours.  Urinalysis    Component Value Date/Time   APPEARANCEUR Clear 11/27/2015 1132   GLUCOSEU Negative 11/27/2015 1132   BILIRUBINUR Negative 11/27/2015 1132     PROTEINUR Negative 11/27/2015 1132   NITRITE Negative 11/27/2015 1132   LEUKOCYTESUR 1+ (A) 11/27/2015 1132     STUDIES: No results found.  ELIGIBLE FOR AVAILABLE RESEARCH PROTOCOL: no  ASSESSMENT: 83 y.o. [place], [place] woman status post left breast upper outer quadrant biopsy 09/24/2016 for a clinical T1B N0, stage IA invasive ductal carcinoma, grade 2, estrogen and progesterone receptor positive, HER-2 not amplified, with an MIB-1 of 5%  (1) Status  post left lumpectomy 10/29/2016 for a pT1c pNX, stage IA invasive ductal carcinoma, grade 2, with positive margins  (a) additional surgery 11/05/2016 removed additional ductal carcinoma in situ, but margins were still positive  (b) left mastectomy with sentinel lymph node sampling 11/12/2016 showed pT1s pN0, with negative margins  (2) adjuvant radiation not indicated  (3) tamoxifen started 11/30/2016.  (a) bone density December 2014 shows a T score of -2.3  PLAN: I spent approximately 30 minutes with Darlene Gregory with most of that time spent discussing her overall situation. We reviewed her surgical results, which I think are excellent. She definitely does not need radiation at this point.  We then discussed anti-estrogens. We discussed the difference between tamoxifen and anastrozole in detail. She understands that anastrozole and the aromatase inhibitors in general work by blocking estrogen production. Accordingly vaginal dryness, decrease in bone density, and of course hot flashes can result. The aromatase inhibitors can also negatively affect the cholesterol profile, although that is a minor effect. One out of 5 women on aromatase inhibitors we will feel "old and achy". This arthralgia/myalgia syndrome, which resembles fibromyalgia clinically, does resolve with stopping the medications. Accordingly this is not a reason to not try an aromatase inhibitor but it is a frequent reason to stop it (in other words 20% of women will not be able to  tolerate these medications).  Tamoxifen on the other hand does not block estrogen production. It does not "take away a woman's estrogen". It blocks the estrogen receptor in breast cells. Like anastrozole, it can also cause hot flashes. As opposed to anastrozole, tamoxifen has many estrogen-like effects. It is technically an estrogen receptor modulator. This means that in some tissues tamoxifen works like estrogen-- for example it helps strengthen the bones. It tends to improve the cholesterol profile. It can cause thickening of the endometrial lining, and even endometrial polyps or rarely cancer of the uterus.(The risk of uterine cancer due to tamoxifen is one additional cancer per thousand women year). It can cause vaginal wetness or stickiness. It can cause blood clots through this estrogen-like effect--the risk of blood clots with tamoxifen is exactly the same as with birth control pills or hormone replacement.  Neither of these agents causes mood changes or weight gain, despite the popular belief that they can have these side effects. We have data from studies comparing either of these drugs with placebo, and in those cases the control group had the same amount of weight gain and depression as the group that took the drug.  After review of the possible benefits and side effects, especially since she already had a hysterectomy, and with a history of osteoporosis, we thought going with tamoxifen would be a better idea. We discussed the issue of blood clots and how she might be able to detect them. I also suggested she start aspirin 81 mg daily, even though of course this primarily protects against arterial clots. Nevertheless there is a small effect on venous clots which is what we are interested in.   I have encouraged her to call us with any problems that may develop before her next visit here, which will be in 3 months.  If all is going well at that time I will brought in the follow-up interval  then.     Chauncey Cruel, MD   11/30/2016 9:55 PM Medical Oncology and Hematology [facility] [address] [place],  [address] Tel. [phone]    Fax. [phone]

## 2016-11-30 ENCOUNTER — Other Ambulatory Visit (HOSPITAL_BASED_OUTPATIENT_CLINIC_OR_DEPARTMENT_OTHER): Payer: Medicare Other

## 2016-11-30 ENCOUNTER — Ambulatory Visit (HOSPITAL_BASED_OUTPATIENT_CLINIC_OR_DEPARTMENT_OTHER): Payer: Medicare Other | Admitting: Oncology

## 2016-11-30 VITALS — BP 143/78 | HR 57 | Temp 97.9°F | Resp 18 | Ht 63.0 in | Wt 182.9 lb

## 2016-11-30 DIAGNOSIS — E039 Hypothyroidism, unspecified: Secondary | ICD-10-CM

## 2016-11-30 DIAGNOSIS — C50412 Malignant neoplasm of upper-outer quadrant of left female breast: Secondary | ICD-10-CM | POA: Diagnosis not present

## 2016-11-30 DIAGNOSIS — C50912 Malignant neoplasm of unspecified site of left female breast: Secondary | ICD-10-CM

## 2016-11-30 DIAGNOSIS — Z170 Estrogen receptor positive status [ER+]: Principal | ICD-10-CM

## 2016-11-30 LAB — CBC WITH DIFFERENTIAL/PLATELET
BASO%: 1.2 % (ref 0.0–2.0)
BASOS ABS: 0.1 10*3/uL (ref 0.0–0.1)
EOS ABS: 0.4 10*3/uL (ref 0.0–0.5)
EOS%: 6 % (ref 0.0–7.0)
HCT: 35.1 % (ref 34.8–46.6)
HEMOGLOBIN: 11.8 g/dL (ref 11.6–15.9)
LYMPH%: 24.9 % (ref 14.0–49.7)
MCH: 31.7 pg (ref 25.1–34.0)
MCHC: 33.6 g/dL (ref 31.5–36.0)
MCV: 94.5 fL (ref 79.5–101.0)
MONO#: 0.6 10*3/uL (ref 0.1–0.9)
MONO%: 10.1 % (ref 0.0–14.0)
NEUT#: 3.6 10*3/uL (ref 1.5–6.5)
NEUT%: 57.8 % (ref 38.4–76.8)
Platelets: 187 10*3/uL (ref 145–400)
RBC: 3.72 10*6/uL (ref 3.70–5.45)
RDW: 13.5 % (ref 11.2–14.5)
WBC: 6.2 10*3/uL (ref 3.9–10.3)
lymph#: 1.5 10*3/uL (ref 0.9–3.3)

## 2016-11-30 LAB — COMPREHENSIVE METABOLIC PANEL
ALT: 9 U/L (ref 0–55)
ANION GAP: 9 meq/L (ref 3–11)
AST: 15 U/L (ref 5–34)
Albumin: 3.5 g/dL (ref 3.5–5.0)
Alkaline Phosphatase: 105 U/L (ref 40–150)
BILIRUBIN TOTAL: 0.44 mg/dL (ref 0.20–1.20)
BUN: 38 mg/dL — ABNORMAL HIGH (ref 7.0–26.0)
CO2: 27 meq/L (ref 22–29)
Calcium: 9.6 mg/dL (ref 8.4–10.4)
Chloride: 103 mEq/L (ref 98–109)
Creatinine: 1.8 mg/dL — ABNORMAL HIGH (ref 0.6–1.1)
EGFR: 25 mL/min/{1.73_m2} — AB (ref >90)
Glucose: 99 mg/dl (ref 70–140)
Potassium: 4.1 mEq/L (ref 3.5–5.1)
Sodium: 139 mEq/L (ref 136–145)
TOTAL PROTEIN: 6.7 g/dL (ref 6.4–8.3)

## 2016-11-30 LAB — DRAW EXTRA CLOT TUBE

## 2016-11-30 MED ORDER — ASPIRIN EC 81 MG PO TBEC: 81.0000 mg | DELAYED_RELEASE_TABLET | Freq: Every day | ORAL | 4 refills | Status: DC

## 2016-11-30 MED ORDER — TAMOXIFEN CITRATE 20 MG PO TABS: 20.0000 mg | ORAL_TABLET | Freq: Every day | ORAL | 12 refills | Status: AC

## 2016-12-02 ENCOUNTER — Other Ambulatory Visit: Payer: Medicare Other

## 2016-12-02 ENCOUNTER — Ambulatory Visit: Payer: Medicare Other | Admitting: Oncology

## 2016-12-14 ENCOUNTER — Other Ambulatory Visit: Payer: Self-pay | Admitting: *Deleted

## 2016-12-14 DIAGNOSIS — I10 Essential (primary) hypertension: Secondary | ICD-10-CM

## 2016-12-14 MED ORDER — OLMESARTAN MEDOXOMIL-HCTZ 40-25 MG PO TABS: 1.0000 | ORAL_TABLET | Freq: Every day | ORAL | 1 refills | Status: DC

## 2017-01-06 ENCOUNTER — Encounter: Payer: Self-pay | Admitting: *Deleted

## 2017-01-31 ENCOUNTER — Ambulatory Visit (INDEPENDENT_AMBULATORY_CARE_PROVIDER_SITE_OTHER): Payer: Medicare Other | Admitting: Family Medicine

## 2017-01-31 VITALS — BP 140/88 | HR 62 | Temp 97.5°F | Ht 63.0 in | Wt 184.0 lb

## 2017-01-31 DIAGNOSIS — M25571 Pain in right ankle and joints of right foot: Secondary | ICD-10-CM

## 2017-01-31 NOTE — Patient Instructions (Signed)
As we discussed, the area that you're concerned about would be unusual to be gout. However, we will obtain some labs to further evaluate. If it does turn out to be gout, I will call in prednisone. Otherwise plan to take Tylenol as needed for pain. If you develop swelling, redness, increased warmth, fevers, chills, nausea, vomiting, please seek immediate medical attention.   Gout Gout is painful swelling that can happen in some of your joints. Gout is a type of arthritis. This condition is caused by having too much uric acid in your body. Uric acid is a chemical that is made when your body breaks down substances called purines. If your body has too much uric acid, sharp crystals can form and build up in your joints. This causes pain and swelling. Gout attacks can happen quickly and be very painful (acute gout). Over time, the attacks can affect more joints and happen more often (chronic gout). Follow these instructions at home: During a Gout Attack  If directed, put ice on the painful area: ? Put ice in a plastic bag. ? Place a towel between your skin and the bag. ? Leave the ice on for 20 minutes, 2-3 times a day.  Rest the joint as much as possible. If the joint is in your leg, you may be given crutches to use.  Raise (elevate) the painful joint above the level of your heart as often as you can.  Drink enough fluids to keep your pee (urine) clear or pale yellow.  Take over-the-counter and prescription medicines only as told by your doctor.  Do not drive or use heavy machinery while taking prescription pain medicine.  Follow instructions from your doctor about what you can or cannot eat and drink.  Return to your normal activities as told by your doctor. Ask your doctor what activities are safe for you. Avoiding Future Gout Attacks  Follow a low-purine diet as told by a specialist (dietitian) or your doctor. Avoid foods and drinks that have a lot of purines, such  as: ? Liver. ? Kidney. ? Anchovies. ? Asparagus. ? Herring. ? Mushrooms ? Mussels. ? Beer.  Limit alcohol intake to no more than 1 drink a day for nonpregnant women and 2 drinks a day for men. One drink equals 12 oz of beer, 5 oz of wine, or 1 oz of hard liquor.  Stay at a healthy weight or lose weight if you are overweight. If you want to lose weight, talk with your doctor. It is important that you do not lose weight too fast.  Start or continue an exercise plan as told by your doctor.  Drink enough fluids to keep your pee clear or pale yellow.  Take over-the-counter and prescription medicines only as told by your doctor.  Keep all follow-up visits as told by your doctor. This is important. Contact a doctor if:  You have another gout attack.  You still have symptoms of a gout attack after10 days of treatment.  You have problems (side effects) because of your medicines.  You have chills or a fever.  You have burning pain when you pee (urinate).  You have pain in your lower back or belly. Get help right away if:  You have very bad pain.  Your pain cannot be controlled.  You cannot pee. This information is not intended to replace advice given to you by your health care provider. Make sure you discuss any questions you have with your health care provider. Document Released: 02/03/2008  Document Revised: 10/02/2015 Document Reviewed: 02/06/2015 Elsevier Interactive Patient Education  [employer].

## 2017-01-31 NOTE — Progress Notes (Signed)
Subjective: Darlene Gregory foot pain PCP: Chevis Pretty, FNP FIE:PPIRJ V Proano is a 83 y.o. female presenting to clinic today for:  1. Right foot pain Patient reports onset of a swollen, red, painful area along the lateral aspect of her right foot on Saturday. She notes that swelling, redness, and pain have improved since then but that she comes in to evaluate for possible gout. She denies fevers, chills, nausea, vomiting. She notes that she has used Tylenol with no improvement in symptoms. She is unable to take NSAIDs. Per patient report, her PCP has advised her to stay away from these types of medications. She notes that she has never had gout in the past. No preceding injury. She wears supportive shoes.  Allergies  Allergen Reactions  . Hyoscyamine Sulfate     REACTION: rash  . Norvasc [Amlodipine Besylate] Swelling  . Phenazopyridine Hcl Rash  . Sulfonamide Derivatives Rash   Past Medical History:  Diagnosis Date  . Cancer ([place]) 10/2016   left breast cancer  . Difficult intubation    "small trachea"; 01/18/05: glidescope but unable to pass stylet -->fast track LMA with blind passage for ETT, consider awake intubation; 08/14/08: IV induction and DL with glidesecope for ETT  . DJD (degenerative joint disease)   . Frequent urination   . GERD (gastroesophageal reflux disease)   . Hypertension   . Hypothyroidism   . Osteoporosis   . PONV (postoperative nausea and vomiting)   . Thyroid disease    Family History  Problem Relation Age of Onset  . Heart disease Mother   . Emphysema Father    Social Hx: non smoker.Current medications reviewed.   ROS: Per HPI  Objective: Office vital signs reviewed. BP 140/88   Pulse 62   Temp (!) 97.5 F (36.4 C) (Oral)   Ht 5\' 3"  (1.6 m)   Wt 184 lb (83.5 kg)   BMI 32.59 kg/m   Physical Examination:  General: Awake, alert, well nourished, well appearing elderly female, No acute distress Cardio: regular rate; +2 DP Pulm: cnormal work  of breathing on room air Extremities: warm, well perfused, No edema, cyanosis or clubbing; +2 pulses bilaterally MSK: normal gait and normal station  Right foot: Patient has full active range of motion. There is mild erythema along the lateral aspect of the right foot near the PIP joint of the 5th digit.  There is also mild associated swelling. No palpable induration, fluctuance. No increased warmth along this area. No palpable bony abnormalities. Skin: dry; intact; no rashes or lesions Neuro: light touch sensation grossly in tact to the LE bilaterally.  Assessment/ Plan: 83 y.o. female   1. Arthralgia of right foot Gouty arthritis in this affected region, would be very unusual. I did discuss this with patient. However she would like labs to further evaluate. A CBC, BMP and uric acid levels were ordered. Differential diagnoses considered include: Irritation from her shoe (which appears to be more likely) versus septic joint (which I have low suspicion of) versus an undiagnosed autoimmune process (she does have a history of hypothyroidism).  Because symptoms are improving, I advised her to continue with oral Tylenol as needed. If her labs do suggest inflammatory process, will consider oral steroids, as she notes she is unable to tolerate NSAIDs. Return precautions were reviewed with the patient who voiced good understanding. She'll follow up with her primary care provider as needed. - CBC with Differential - Basic Metabolic Panel - Uric Acid   Orders Placed This Encounter  Procedures  . CBC with Differential  . Basic Metabolic Panel  . Uric Acid     Ashly Windell [place], [place] [phone]

## 2017-02-01 ENCOUNTER — Other Ambulatory Visit: Payer: Self-pay | Admitting: Family Medicine

## 2017-02-01 LAB — URIC ACID: Uric Acid: 7.9 mg/dL — ABNORMAL HIGH (ref 2.5–7.1)

## 2017-02-01 LAB — CBC WITH DIFFERENTIAL/PLATELET
BASOS ABS: 0.1 10*3/uL (ref 0.0–0.2)
BASOS: 1 %
EOS (ABSOLUTE): 0.3 10*3/uL (ref 0.0–0.4)
Eos: 3 %
Hematocrit: 35.2 % (ref 34.0–46.6)
Hemoglobin: 11.7 g/dL (ref 11.1–15.9)
IMMATURE GRANULOCYTES: 0 %
Immature Grans (Abs): 0 10*3/uL (ref 0.0–0.1)
LYMPHS: 25 %
Lymphocytes Absolute: 1.9 10*3/uL (ref 0.7–3.1)
MCH: 31.7 pg (ref 26.6–33.0)
MCHC: 33.2 g/dL (ref 31.5–35.7)
MCV: 95 fL (ref 79–97)
MONOCYTES: 10 %
MONOS ABS: 0.7 10*3/uL (ref 0.1–0.9)
Neutrophils Absolute: 4.8 10*3/uL (ref 1.4–7.0)
Neutrophils: 61 %
PLATELETS: 178 10*3/uL (ref 150–379)
RBC: 3.69 x10E6/uL — AB (ref 3.77–5.28)
RDW: 13.3 % (ref 12.3–15.4)
WBC: 7.7 10*3/uL (ref 3.4–10.8)

## 2017-02-01 LAB — BASIC METABOLIC PANEL
BUN/Creatinine Ratio: 19 (ref 12–28)
BUN: 36 mg/dL — ABNORMAL HIGH (ref 8–27)
CALCIUM: 9 mg/dL (ref 8.7–10.3)
CHLORIDE: 104 mmol/L (ref 96–106)
CO2: 24 mmol/L (ref 20–29)
Creatinine, Ser: 1.85 mg/dL — ABNORMAL HIGH (ref 0.57–1.00)
GFR calc Af Amer: 29 mL/min/{1.73_m2} — ABNORMAL LOW (ref >59)
GFR calc non Af Amer: 25 mL/min/{1.73_m2} — ABNORMAL LOW (ref >59)
Glucose: 116 mg/dL — ABNORMAL HIGH (ref 65–99)
POTASSIUM: 4.4 mmol/L (ref 3.5–5.2)
Sodium: 144 mmol/L (ref 134–144)

## 2017-02-01 MED ORDER — PREDNISONE 10 MG PO TABS: ORAL_TABLET | ORAL | 0 refills | Status: DC

## 2017-02-15 ENCOUNTER — Telehealth: Payer: Self-pay | Admitting: Nurse Practitioner

## 2017-02-15 ENCOUNTER — Other Ambulatory Visit: Payer: Medicare Other

## 2017-02-15 ENCOUNTER — Other Ambulatory Visit: Payer: Self-pay | Admitting: Family Medicine

## 2017-02-15 DIAGNOSIS — M10379 Gout due to renal impairment, unspecified ankle and foot: Secondary | ICD-10-CM

## 2017-02-15 NOTE — Telephone Encounter (Signed)
Pt notified of lab order Verbalizes understanding

## 2017-02-15 NOTE — Telephone Encounter (Signed)
I have placed repeat labs in her file.  She may come in at her convenience.

## 2017-02-15 NOTE — Telephone Encounter (Signed)
Patient seen Dr. Darnell Level 9/24 and is requesting labs. Please advise and send back to the pools.

## 2017-02-16 ENCOUNTER — Other Ambulatory Visit: Payer: Self-pay | Admitting: Family Medicine

## 2017-02-16 ENCOUNTER — Telehealth: Payer: Self-pay | Admitting: Nurse Practitioner

## 2017-02-16 LAB — CBC WITH DIFFERENTIAL/PLATELET
BASOS ABS: 0 10*3/uL (ref 0.0–0.2)
BASOS: 1 %
EOS (ABSOLUTE): 0.2 10*3/uL (ref 0.0–0.4)
Eos: 3 %
Hematocrit: 35.2 % (ref 34.0–46.6)
Hemoglobin: 11 g/dL — ABNORMAL LOW (ref 11.1–15.9)
Immature Grans (Abs): 0 10*3/uL (ref 0.0–0.1)
Immature Granulocytes: 0 %
LYMPHS ABS: 1.9 10*3/uL (ref 0.7–3.1)
LYMPHS: 25 %
MCH: 31.1 pg (ref 26.6–33.0)
MCHC: 31.3 g/dL — AB (ref 31.5–35.7)
MCV: 99 fL — AB (ref 79–97)
MONOS ABS: 0.5 10*3/uL (ref 0.1–0.9)
Monocytes: 6 %
NEUTROS ABS: 5 10*3/uL (ref 1.4–7.0)
Neutrophils: 65 %
PLATELETS: 175 10*3/uL (ref 150–379)
RBC: 3.54 x10E6/uL — ABNORMAL LOW (ref 3.77–5.28)
RDW: 13.3 % (ref 12.3–15.4)
WBC: 7.6 10*3/uL (ref 3.4–10.8)

## 2017-02-16 LAB — URIC ACID: URIC ACID: 7.8 mg/dL — AB (ref 2.5–7.1)

## 2017-02-16 MED ORDER — PREDNISONE 10 MG PO TABS: ORAL_TABLET | ORAL | 0 refills | Status: DC

## 2017-02-16 NOTE — Telephone Encounter (Signed)
Patient notified of lab results and rx sent to pharmacy

## 2017-02-16 NOTE — Progress Notes (Signed)
Persistently elevated Uric acid level.  Patient continues to have symptoms.    Meds ordered this encounter  Medications  . predniSONE (DELTASONE) 10 MG tablet    Sig: Take 60mg  by mouth day 1, 50mg  day 2, 40mg  day 3, 30mg  day 4, 20mg  day 5, 10mg  day 6.  Then stop.    Dispense:  21 tablet    Refill:  0

## 2017-02-21 ENCOUNTER — Telehealth: Payer: Self-pay | Admitting: Nurse Practitioner

## 2017-02-22 ENCOUNTER — Encounter: Payer: Self-pay | Admitting: Nurse Practitioner

## 2017-02-22 ENCOUNTER — Ambulatory Visit (INDEPENDENT_AMBULATORY_CARE_PROVIDER_SITE_OTHER): Payer: Medicare Other | Admitting: Nurse Practitioner

## 2017-02-22 VITALS — BP 140/82 | HR 50 | Temp 97.2°F | Ht 63.0 in

## 2017-02-22 DIAGNOSIS — E034 Atrophy of thyroid (acquired): Secondary | ICD-10-CM | POA: Diagnosis not present

## 2017-02-22 DIAGNOSIS — M109 Gout, unspecified: Secondary | ICD-10-CM | POA: Diagnosis not present

## 2017-02-22 DIAGNOSIS — I10 Essential (primary) hypertension: Secondary | ICD-10-CM | POA: Diagnosis not present

## 2017-02-22 MED ORDER — HYDROCOD POLST-CPM POLST ER 10-8 MG/5ML PO SUER: 5.0000 mL | Freq: Two times a day (BID) | ORAL | 0 refills | Status: DC | PRN

## 2017-02-22 MED ORDER — COLCHICINE 0.6 MG PO TABS: ORAL_TABLET | ORAL | 1 refills | Status: DC

## 2017-02-22 NOTE — Progress Notes (Addendum)
   Subjective:    Patient ID: Darlene Gregory, female    DOB: 01-Feb-1934, 83 y.o.   MRN: [id number]  HPI Patient in the office with c/o "gout" on the lateral side of the right foot.  She states she is swollen and that makes her toes hurt.  Patient saw Dr. Lajuana Ripple on 01/31/17 and was instructed to take Tylenol as needed.  Labs drawn at this visit including CBC, BMP, and uric acid.  Patient has completed 2 prednisone dose packs and the pain did not improve.   Review of Systems  Constitutional: Negative for chills and fever.  Musculoskeletal:       Lateral right foot pain x 3 weeks   All other systems reviewed and are negative.      Objective:   Physical Exam  Constitutional: She appears well-developed and well-nourished. No distress.  Cardiovascular: Normal rate, regular rhythm and normal heart sounds.   Pulmonary/Chest: Effort normal and breath sounds normal. No respiratory distress. She has no wheezes.  Musculoskeletal: Normal range of motion. She exhibits edema (+1 pitting edema in RLE) and tenderness (lateral right foot and base of second toe).  Skin: Skin is warm and dry.  Redness and warmth over lateral aspect of right foot    BP 140/82   Pulse (!) 50   Temp (!) 97.2 F (36.2 C) (Oral)   Ht '5\' 3"'$  (1.6 m)     Creatinine Clearance: 31.9 mL/min  Assessment & Plan:   1. Acute gout of right foot, unspecified cause    Meds ordered this encounter  Medications  . colchicine 0.6 MG tablet    Sig: 2 tablets po at onset may repeat 1 tablet in 1 hour- no more then 3 tablets in 24 hours    Dispense:  20 tablet    Refill:  1    Order Specific Question:   Supervising Provider    Answer:   Evette Doffing, CAROL L [[id number]]   Stop colchicine once pain subsides Follow up prn  * Refill tussionex rx- she gets one every year to keep on hand * labs drawn today so patent would not have to come back during flu season for follow up Orders Placed This Encounter  Procedures  . CMP14+EGFR  .  Lipid panel  . Thyroid Panel With TSH     Darlene Hassell Done, FNP

## 2017-02-22 NOTE — Addendum Note (Signed)
Addended by: Chevis Pretty on: 02/22/2017 11:45 AM   Modules accepted: Orders

## 2017-02-22 NOTE — Patient Instructions (Signed)

## 2017-02-24 ENCOUNTER — Other Ambulatory Visit: Payer: Medicare Other

## 2017-02-25 LAB — CMP14+EGFR
ALT: 13 IU/L (ref 0–32)
AST: 12 IU/L (ref 0–40)
Albumin/Globulin Ratio: 1.8 (ref 1.2–2.2)
Albumin: 3.9 g/dL (ref 3.5–4.7)
Alkaline Phosphatase: 70 IU/L (ref 39–117)
BUN/Creatinine Ratio: 18 (ref 12–28)
BUN: 32 mg/dL — AB (ref 8–27)
Bilirubin Total: 0.4 mg/dL (ref 0.0–1.2)
CALCIUM: 8.9 mg/dL (ref 8.7–10.3)
CO2: 28 mmol/L (ref 20–29)
CREATININE: 1.73 mg/dL — AB (ref 0.57–1.00)
Chloride: 102 mmol/L (ref 96–106)
GFR, EST AFRICAN AMERICAN: 31 mL/min/{1.73_m2} — AB (ref >59)
GFR, EST NON AFRICAN AMERICAN: 27 mL/min/{1.73_m2} — AB (ref >59)
GLUCOSE: 101 mg/dL — AB (ref 65–99)
Globulin, Total: 2.2 g/dL (ref 1.5–4.5)
POTASSIUM: 4.3 mmol/L (ref 3.5–5.2)
Sodium: 141 mmol/L (ref 134–144)
TOTAL PROTEIN: 6.1 g/dL (ref 6.0–8.5)

## 2017-02-25 LAB — THYROID PANEL WITH TSH
Free Thyroxine Index: 3.1 (ref 1.2–4.9)
T3 Uptake Ratio: 31 % (ref 24–39)
T4, Total: 10.1 ug/dL (ref 4.5–12.0)
TSH: 3.13 u[IU]/mL (ref 0.450–4.500)

## 2017-02-25 LAB — LIPID PANEL
CHOL/HDL RATIO: 2.2 ratio (ref 0.0–4.4)
Cholesterol, Total: 132 mg/dL (ref 100–199)
HDL: 61 mg/dL (ref >39)
LDL Calculated: 56 mg/dL (ref 0–99)
TRIGLYCERIDES: 76 mg/dL (ref 0–149)
VLDL CHOLESTEROL CAL: 15 mg/dL (ref 5–40)

## 2017-03-01 ENCOUNTER — Telehealth: Payer: Self-pay | Admitting: Nurse Practitioner

## 2017-03-01 NOTE — Telephone Encounter (Signed)
Patient notified of lab results

## 2017-03-02 ENCOUNTER — Telehealth: Payer: Self-pay | Admitting: Nurse Practitioner

## 2017-03-02 NOTE — Telephone Encounter (Signed)
Lab request is printed & waiting on pt to pick-up.

## 2017-03-03 ENCOUNTER — Encounter: Payer: Medicare Other | Admitting: Adult Health

## 2017-03-09 ENCOUNTER — Other Ambulatory Visit: Payer: Medicare Other

## 2017-03-09 ENCOUNTER — Ambulatory Visit: Payer: Medicare Other | Admitting: Oncology

## 2017-03-09 NOTE — Progress Notes (Signed)
[place]  [phone]) [phone] [phone]     ID: Darlene Gregory DOB: 04-11-1934  MR#: [id number]  YDX#:[id number]  Patient Care Team: Chevis Pretty, FNP as PCP - General (Nurse Practitioner) Excell Seltzer, MD as Consulting Physician (General Surgery) Magrinat, Virgie Dad, MD as Consulting Physician (Oncology) Eppie Gibson, MD as Attending Physician (Radiation Oncology) Arvella Nigh, MD as Consulting Physician (Obstetrics and Gynecology) OTHER MD:  CHIEF COMPLAINT: Estrogen receptor positive breast cancer  CURRENT TREATMENT: Tamoxifen   BREAST CANCER HISTORY: From the original intake note:   Ms. Stonehocker had screening mammography April 2018 showing a possible mass in the left breast. She was referred to the [facility] where she underwent left mammography and ultrasonography on 09/10/2016. This showed the breast density to be category C. In the upper outer quadrant of the breast there was an area of architectural distortion which was not palpable. Ultrasonography confirmed a 0.9 cm hypoechoic mass at the 1:30 o'clock position of the left breast 3 cm from the nipple. Ultrasound of the left axilla showed a suspicious lymph node.  On 09/24/2016 the patient underwent biopsy of the left breast mass and the left axillary lymph node in question. The lymph node was benign, and this was read as concordant by radiology. The left breast mass however showed invasive ductal carcinoma, grade 2, estrogen receptor 100% positive, progesterone receptor 95% positive, both with strong staining intensity, with an MIB-1 of 5%, and no HER-2 amplification, the signals ratio being 1.46 and the number per cell 1.90.  The patient's subsequent history is as detailed below  INTERVAL HISTORY: Jaxon returns today for follow-up and treatment of her estrogen receptor positive breast cancer accompanied by her son, [name]. She continues on tamoxifen, with good tolerance. She  occasionally had hot flashes, none of which occur at night. She denies vaginal discharge.   REVIEW OF SYSTEMS: Bre reports that se is doing well overall. She sees Dr. Chevis Pretty in [place] for gout of the right foot. She takes colchicine with good tolerance. Her exercise consists of house chores. Her gout often limits her exercise, but she tries to climb the stairs about 40 times every day. She denies unusual headaches, visual changes, nausea, vomiting, or dizziness. There has been no unusual cough, phlegm production, or pleurisy. This been no change in bowel or bladder habits. She denies unexplained fatigue or unexplained weight loss, bleeding, rash, or fever. A detailed review of systems was otherwise stable.    PAST MEDICAL HISTORY: Past Medical History:  Diagnosis Date  . Cancer ([facility]) 10/2016   left breast cancer  . Difficult intubation    "small trachea"; 01/18/05: glidescope but unable to pass stylet -->fast track LMA with blind passage for ETT, consider awake intubation; 08/14/08: IV induction and DL with glidesecope for ETT  . DJD (degenerative joint disease)   . Frequent urination   . GERD (gastroesophageal reflux disease)   . Hypertension   . Hypothyroidism   . Osteoporosis   . PONV (postoperative nausea and vomiting)   . Thyroid disease     PAST SURGICAL HISTORY: Past Surgical History:  Procedure Laterality Date  . ABDOMINAL HYSTERECTOMY    . CAROTID BODY TUMOR EXCISION    . COLONOSCOPY W/ POLYPECTOMY    . EYE SURGERY Bilateral    cataract removed  . JOINT REPLACEMENT Right 2006   knee  . PAROTIDECTOMY Left     FAMILY HISTORY Family History  Problem Relation Age of Onset  . Heart disease Mother   .  Emphysema Father   The patient's father died at the age of 73 from emphysema. The patient's mother died from "old age" at 46. The patient had one brother, 3 sisters. There is no history of breast cancer in the family. One sister had "a female cancer", but the  patient is not sure what it may have been.  GYNECOLOGIC HISTORY:  No LMP recorded. Patient has had a hysterectomy. The patient is status post total abdominal hysterectomy with bilateral salpingo-oophorectomy remotely. She is GX P2, first live birth age 46. She never used oral contraceptives.  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 Her husband [name] has been a Psychologist, sport and exercise. They currently have 41 acres. Son [name], 56, lives in [place] and works for [employer]. Son [name] lives in [place] and works for [employer]. The patient has 2 grandchildren and some great-grandchildren. She is a Psychologist, forensic.    ADVANCED DIRECTIVES: In place   HEALTH MAINTENANCE: Social History   Tobacco Use  . Smoking status: Former Research scientist (life sciences)  . Smokeless tobacco: Never Used  Substance Use Topics  . Alcohol use: No  . Drug use: No     Colonoscopy: Repeat due 2019/[facility]  PAP:  Bone density: 04/20/2013, T score -2.3   Allergies  Allergen Reactions  . Hyoscyamine Sulfate     REACTION: rash  . Norvasc [Amlodipine Besylate] Swelling  . Phenazopyridine Hcl Rash  . Sulfonamide Derivatives Rash    Current Outpatient Medications  Medication Sig Dispense Refill  . aspirin EC 81 MG tablet Take 1 tablet (81 mg total) by mouth daily. 100 tablet 4  . chlorpheniramine-HYDROcodone (TUSSIONEX) 10-8 MG/5ML SUER Take 5 mLs by mouth every 12 (twelve) hours as needed for cough. 120 mL 0  . Cholecalciferol (VITAMIN D) 2000 UNITS CAPS Take by mouth daily.    . cloNIDine (CATAPRES) 0.1 MG tablet Take 1 tablet (0.1 mg total) by mouth 2 (two) times daily. 180 tablet 1  . FIBER PO Take 2 each by mouth daily.     . fish oil-omega-3 fatty acids 1000 MG capsule Take 1 g by mouth daily.    Marland Kitchen levothyroxine (SYNTHROID, LEVOTHROID) 75 MCG tablet Take 1 tablet (75 mcg total) by mouth daily. 90 tablet 1  . Multiple Vitamins-Minerals (ICAPS PO) Take 1 tablet by mouth daily.     Marland Kitchen  olmesartan-hydrochlorothiazide (BENICAR HCT) 40-25 MG tablet Take 1 tablet by mouth daily. 90 tablet 1  . omeprazole (PRILOSEC) 20 MG capsule Take 1 capsule (20 mg total) by mouth daily. 90 capsule 1  . tamoxifen (NOLVADEX) 20 MG tablet Take 20 mg by mouth daily.    . vitamin C (ASCORBIC ACID) 500 MG tablet Take 500 mg by mouth daily.     No current facility-administered medications for this visit.     OBJECTIVE: Elderly white woman who appears stated age 37:   03/14/17 1137  BP: (!) 175/52  Pulse: 98  Resp: 18  Temp: 97.9 F (36.6 C)  SpO2: 98%     Body mass index is 32.56 kg/m.    ECOG FS:1 - Symptomatic but completely ambulatory  Sclerae unicteric, EOMs intact Oropharynx clear and moist No cervical or supraclavicular adenopathy Lungs no rales or rhonchi Heart regular rate and rhythm Abd soft, nontender, positive bowel sounds MSK kyphosis but no focal spinal tenderness, no upper extremity lymphedema Neuro: nonfocal, well oriented, appropriate affect Breasts: The right breast is benign.  The left breast is status post  mastectomy.  There are a couple of fat tags particularly medially that are of slight bother to her, but not enough that she might want surgery there is no evidence of local recurrence.  Both axillae are benign.  LAB RESULTS:  CMP     Component Value Date/Time   NA 141 03/14/2017 1056   K 4.6 03/14/2017 1056   CL 102 02/24/2017 1027   CO2 28 03/14/2017 1056   GLUCOSE 90 03/14/2017 1056   BUN 29.9 (H) 03/14/2017 1056   CREATININE 1.8 (H) 03/14/2017 1056   CALCIUM 9.4 03/14/2017 1056   PROT 6.6 03/14/2017 1056   ALBUMIN 3.4 (L) 03/14/2017 1056   AST 14 03/14/2017 1056   ALT 11 03/14/2017 1056   ALKPHOS 69 03/14/2017 1056   BILITOT 0.38 03/14/2017 1056   GFRNONAA 27 (L) 02/24/2017 1027   GFRNONAA 49 (L) 11/24/2012 0823   GFRAA 31 (L) 02/24/2017 1027   GFRAA 56 (L) 11/24/2012 0823    No results found for: TOTALPROTELP, ALBUMINELP, A1GS, A2GS, BETS,  BETA2SER, GAMS, MSPIKE, SPEI  No results found for: KPAFRELGTCHN, LAMBDASER, [facility]  Lab Results  Component Value Date   WBC 6.4 03/14/2017   NEUTROABS 3.7 03/14/2017   HGB 11.2 (L) 03/14/2017   HCT 34.4 (L) 03/14/2017   MCV 96.9 03/14/2017   PLT 158 03/14/2017      Chemistry      Component Value Date/Time   NA 141 03/14/2017 1056   K 4.6 03/14/2017 1056   CL 102 02/24/2017 1027   CO2 28 03/14/2017 1056   BUN 29.9 (H) 03/14/2017 1056   CREATININE 1.8 (H) 03/14/2017 1056      Component Value Date/Time   CALCIUM 9.4 03/14/2017 1056   ALKPHOS 69 03/14/2017 1056   AST 14 03/14/2017 1056   ALT 11 03/14/2017 1056   BILITOT 0.38 03/14/2017 1056       No results found for: LABCA2  No components found for: [id number]  No results for input(s): INR in the last 168 hours.  Urinalysis    Component Value Date/Time   APPEARANCEUR Clear 11/27/2015 1132   GLUCOSEU Negative 11/27/2015 1132   BILIRUBINUR Negative 11/27/2015 1132   PROTEINUR Negative 11/27/2015 1132   NITRITE Negative 11/27/2015 1132   LEUKOCYTESUR 1+ (A) 11/27/2015 1132     STUDIES: Lab work results discussed at [facility]: no  ASSESSMENT: 83 y.o. [place], [place] woman status post left breast upper outer quadrant biopsy 09/24/2016 for a clinical T1B N0, stage IA invasive ductal carcinoma, grade 2, estrogen and progesterone receptor positive, HER-2 not amplified, with an MIB-1 of 5%  (1) Status post left lumpectomy 10/29/2016 for a pT1c pNX, stage IA invasive ductal carcinoma, grade 2, with positive margins  (a) additional surgery 11/05/2016 removed additional ductal carcinoma in situ, but margins were still positive  (b) left mastectomy with sentinel lymph node sampling 11/12/2016 showed pT1s pN0, with negative margins  (2) adjuvant radiation not indicated  (3) tamoxifen started 11/30/2016.  (a) bone density December 2014 shows a T score of -2.3  PLAN: [place]  is now just over 3 months out from definitive surgery for her breast cancer.  She is of course concerned about the possibility of recurrence and we discussed that today.  We do not have a test such as a blood test that could tell us that she has microscopic disease and of course microscopic disease with not shown any scan that is available right now  I  reassured her that the reason she takes tamoxifen is precisely because of that small possibility.  The tamoxifen "stars" her breast cells if any breast cancer cells are left.  She is not interested in further surgery for the little fat tag in the medial aspect of her left mastectomy incision.  My one concern is her renal function, which is poor.  She does not drink much water during the day.  I have challenged her to drink a quarter and a half of fluid daily.  We will follow her renal function also at the next visit  She knows to call for any other problems that may develop before that visit.  Magrinat, Virgie Dad, MD  03/14/17 11:55 AM Medical Oncology and Hematology [facility] [address] [place], [place] [address] Tel. [phone]    Fax. [phone]  This document serves as a record of services personally performed by Lurline Del, MD. It was created on his behalf by Sheron Nightingale, a trained medical scribe. The creation of this record is based on the scribe's personal observations and the provider's statements to them.   I have reviewed the above documentation for accuracy and completeness, and I agree with the above.

## 2017-03-11 ENCOUNTER — Other Ambulatory Visit: Payer: Self-pay | Admitting: *Deleted

## 2017-03-11 DIAGNOSIS — C50412 Malignant neoplasm of upper-outer quadrant of left female breast: Secondary | ICD-10-CM

## 2017-03-11 DIAGNOSIS — Z170 Estrogen receptor positive status [ER+]: Principal | ICD-10-CM

## 2017-03-14 ENCOUNTER — Ambulatory Visit: Payer: Medicare Other | Admitting: Oncology

## 2017-03-14 ENCOUNTER — Other Ambulatory Visit (HOSPITAL_BASED_OUTPATIENT_CLINIC_OR_DEPARTMENT_OTHER): Payer: Medicare Other

## 2017-03-14 ENCOUNTER — Telehealth: Payer: Self-pay | Admitting: Oncology

## 2017-03-14 VITALS — BP 175/52 | HR 98 | Temp 97.9°F | Resp 18 | Ht 63.0 in | Wt 183.8 lb

## 2017-03-14 DIAGNOSIS — C50412 Malignant neoplasm of upper-outer quadrant of left female breast: Secondary | ICD-10-CM | POA: Diagnosis not present

## 2017-03-14 DIAGNOSIS — Z170 Estrogen receptor positive status [ER+]: Secondary | ICD-10-CM

## 2017-03-14 DIAGNOSIS — Z79811 Long term (current) use of aromatase inhibitors: Secondary | ICD-10-CM | POA: Diagnosis not present

## 2017-03-14 LAB — CBC WITH DIFFERENTIAL/PLATELET
BASO%: 0.5 % (ref 0.0–2.0)
Basophils Absolute: 0 10*3/uL (ref 0.0–0.1)
EOS ABS: 0.3 10*3/uL (ref 0.0–0.5)
EOS%: 3.9 % (ref 0.0–7.0)
HCT: 34.4 % — ABNORMAL LOW (ref 34.8–46.6)
HEMOGLOBIN: 11.2 g/dL — AB (ref 11.6–15.9)
LYMPH#: 1.9 10*3/uL (ref 0.9–3.3)
LYMPH%: 29 % (ref 14.0–49.7)
MCH: 31.5 pg (ref 25.1–34.0)
MCHC: 32.6 g/dL (ref 31.5–36.0)
MCV: 96.9 fL (ref 79.5–101.0)
MONO#: 0.5 10*3/uL (ref 0.1–0.9)
MONO%: 8 % (ref 0.0–14.0)
NEUT%: 58.6 % (ref 38.4–76.8)
NEUTROS ABS: 3.7 10*3/uL (ref 1.5–6.5)
Platelets: 158 10*3/uL (ref 145–400)
RBC: 3.55 10*6/uL — ABNORMAL LOW (ref 3.70–5.45)
RDW: 13.5 % (ref 11.2–14.5)
WBC: 6.4 10*3/uL (ref 3.9–10.3)

## 2017-03-14 LAB — COMPREHENSIVE METABOLIC PANEL
ALT: 11 U/L (ref 0–55)
ANION GAP: 7 meq/L (ref 3–11)
AST: 14 U/L (ref 5–34)
Albumin: 3.4 g/dL — ABNORMAL LOW (ref 3.5–5.0)
Alkaline Phosphatase: 69 U/L (ref 40–150)
BUN: 29.9 mg/dL — ABNORMAL HIGH (ref 7.0–26.0)
CALCIUM: 9.4 mg/dL (ref 8.4–10.4)
CHLORIDE: 106 meq/L (ref 98–109)
CO2: 28 mEq/L (ref 22–29)
Creatinine: 1.8 mg/dL — ABNORMAL HIGH (ref 0.6–1.1)
EGFR: 26 mL/min/{1.73_m2} — AB (ref >60)
Glucose: 90 mg/dl (ref 70–140)
Potassium: 4.6 mEq/L (ref 3.5–5.1)
Sodium: 141 mEq/L (ref 136–145)
Total Bilirubin: 0.38 mg/dL (ref 0.20–1.20)
Total Protein: 6.6 g/dL (ref 6.4–8.3)

## 2017-03-14 NOTE — Telephone Encounter (Signed)
Gave patient avs with appt per 11/5 los.

## 2017-04-04 ENCOUNTER — Ambulatory Visit: Payer: Medicare Other | Admitting: Nurse Practitioner

## 2017-05-07 ENCOUNTER — Other Ambulatory Visit: Payer: Self-pay | Admitting: Nurse Practitioner

## 2017-05-07 DIAGNOSIS — I10 Essential (primary) hypertension: Secondary | ICD-10-CM

## 2017-05-27 ENCOUNTER — Other Ambulatory Visit: Payer: Self-pay | Admitting: Nurse Practitioner

## 2017-05-27 DIAGNOSIS — E034 Atrophy of thyroid (acquired): Secondary | ICD-10-CM

## 2017-05-27 DIAGNOSIS — I10 Essential (primary) hypertension: Secondary | ICD-10-CM

## 2017-05-31 ENCOUNTER — Other Ambulatory Visit: Payer: Self-pay | Admitting: *Deleted

## 2017-05-31 DIAGNOSIS — E034 Atrophy of thyroid (acquired): Secondary | ICD-10-CM

## 2017-05-31 DIAGNOSIS — I10 Essential (primary) hypertension: Secondary | ICD-10-CM

## 2017-05-31 MED ORDER — LEVOTHYROXINE SODIUM 75 MCG PO TABS: 75.0000 ug | ORAL_TABLET | Freq: Every day | ORAL | 2 refills | Status: DC

## 2017-05-31 MED ORDER — CLONIDINE HCL 0.1 MG PO TABS: 0.1000 mg | ORAL_TABLET | Freq: Two times a day (BID) | ORAL | 0 refills | Status: DC

## 2017-05-31 NOTE — Addendum Note (Signed)
Addended by: Antonietta Barcelona D on: 05/31/2017 09:21 AM   Modules accepted: Orders

## 2017-06-17 ENCOUNTER — Telehealth: Payer: Self-pay | Admitting: Oncology

## 2017-06-17 NOTE — Telephone Encounter (Signed)
Patient called in to reschedule  °

## 2017-07-19 ENCOUNTER — Telehealth: Payer: Self-pay | Admitting: *Deleted

## 2017-07-19 NOTE — Telephone Encounter (Signed)
Telephone call to patient she is due for her second shingrix shot and we have received a dose for her. Patient does not want the second dose at this time and wants to wait till her next appointment. I explained that it may not be available at her next visit because it is on backorder. She still wishes to wait because of the local reaction that she had with the first dose.

## 2017-08-23 ENCOUNTER — Encounter (INDEPENDENT_AMBULATORY_CARE_PROVIDER_SITE_OTHER): Payer: Self-pay

## 2017-08-23 ENCOUNTER — Ambulatory Visit: Payer: Medicare Other | Admitting: Nurse Practitioner

## 2017-08-23 ENCOUNTER — Encounter: Payer: Self-pay | Admitting: Nurse Practitioner

## 2017-08-23 VITALS — BP 116/68 | HR 53 | Temp 97.0°F | Ht 63.0 in | Wt 187.0 lb

## 2017-08-23 DIAGNOSIS — K5730 Diverticulosis of large intestine without perforation or abscess without bleeding: Secondary | ICD-10-CM

## 2017-08-23 DIAGNOSIS — K449 Diaphragmatic hernia without obstruction or gangrene: Secondary | ICD-10-CM | POA: Diagnosis not present

## 2017-08-23 DIAGNOSIS — M810 Age-related osteoporosis without current pathological fracture: Secondary | ICD-10-CM

## 2017-08-23 DIAGNOSIS — C50412 Malignant neoplasm of upper-outer quadrant of left female breast: Secondary | ICD-10-CM

## 2017-08-23 DIAGNOSIS — E034 Atrophy of thyroid (acquired): Secondary | ICD-10-CM

## 2017-08-23 DIAGNOSIS — Z6832 Body mass index (BMI) 32.0-32.9, adult: Secondary | ICD-10-CM | POA: Diagnosis not present

## 2017-08-23 DIAGNOSIS — Z170 Estrogen receptor positive status [ER+]: Secondary | ICD-10-CM

## 2017-08-23 DIAGNOSIS — M109 Gout, unspecified: Secondary | ICD-10-CM | POA: Insufficient documentation

## 2017-08-23 DIAGNOSIS — F411 Generalized anxiety disorder: Secondary | ICD-10-CM

## 2017-08-23 DIAGNOSIS — K219 Gastro-esophageal reflux disease without esophagitis: Secondary | ICD-10-CM

## 2017-08-23 DIAGNOSIS — I10 Essential (primary) hypertension: Secondary | ICD-10-CM | POA: Diagnosis not present

## 2017-08-23 DIAGNOSIS — K649 Unspecified hemorrhoids: Secondary | ICD-10-CM | POA: Diagnosis not present

## 2017-08-23 DIAGNOSIS — M1009 Idiopathic gout, multiple sites: Secondary | ICD-10-CM | POA: Diagnosis not present

## 2017-08-23 DIAGNOSIS — D509 Iron deficiency anemia, unspecified: Secondary | ICD-10-CM | POA: Diagnosis not present

## 2017-08-23 MED ORDER — CITALOPRAM HYDROBROMIDE 10 MG PO TABS: 10.0000 mg | ORAL_TABLET | Freq: Every day | ORAL | 1 refills | Status: DC

## 2017-08-23 NOTE — Patient Instructions (Signed)
Earwax Buildup, Adult The ears produce a substance called earwax that helps keep bacteria out of the ear and protects the skin in the ear canal. Occasionally, earwax can build up in the ear and cause discomfort or hearing loss. What increases the risk? This condition is more likely to develop in people who:  Are female.  Are elderly.  Naturally produce more earwax.  Clean their ears often with cotton swabs.  Use earplugs often.  Use in-ear headphones often.  Wear hearing aids.  Have narrow ear canals.  Have earwax that is overly thick or sticky.  Have eczema.  Are dehydrated.  Have excess hair in the ear canal.  What are the signs or symptoms? Symptoms of this condition include:  Reduced or muffled hearing.  A feeling of fullness in the ear or feeling that the ear is plugged.  Fluid coming from the ear.  Ear pain.  Ear itch.  Ringing in the ear.  Coughing.  An obvious piece of earwax that can be seen inside the ear canal.  How is this diagnosed? This condition may be diagnosed based on:  Your symptoms.  Your medical history.  An ear exam. During the exam, your health care provider will look into your ear with an instrument called an otoscope.  You may have tests, including a hearing test. How is this treated? This condition may be treated by:  Using ear drops to soften the earwax.  Having the earwax removed by a health care provider. The health care provider may: ? Flush the ear with water. ? Use an instrument that has a loop on the end (curette). ? Use a suction device.  Surgery to remove the wax buildup. This may be done in severe cases.  Follow these instructions at home:  Take over-the-counter and prescription medicines only as told by your health care provider.  Do not put any objects, including cotton swabs, into your ear. You can clean the opening of your ear canal with a washcloth or facial tissue.  Follow instructions from your health  care provider about cleaning your ears. Do not over-clean your ears.  Drink enough fluid to keep your urine clear or pale yellow. This will help to thin the earwax.  Keep all follow-up visits as told by your health care provider. If earwax builds up in your ears often or if you use hearing aids, consider seeing your health care provider for routine, preventive ear cleanings. Ask your health care provider how often you should schedule your cleanings.  If you have hearing aids, clean them according to instructions from the manufacturer and your health care provider. Contact a health care provider if:  You have ear pain.  You develop a fever.  You have blood, pus, or other fluid coming from your ear.  You have hearing loss.  You have ringing in your ears that does not go away.  Your symptoms do not improve with treatment.  You feel like the room is spinning (vertigo). Summary  Earwax can build up in the ear and cause discomfort or hearing loss.  The most common symptoms of this condition include reduced or muffled hearing and a feeling of fullness in the ear or feeling that the ear is plugged.  This condition may be diagnosed based on your symptoms, your medical history, and an ear exam.  This condition may be treated by using ear drops to soften the earwax or by having the earwax removed by a health care provider.  Do   not put any objects, including cotton swabs, into your ear. You can clean the opening of your ear canal with a washcloth or facial tissue. This information is not intended to replace advice given to you by your health care provider. Make sure you discuss any questions you have with your health care provider. Document Released: 06/03/2004 Document Revised: 07/07/2016 Document Reviewed: 07/07/2016 Elsevier Interactive Patient Education  2018 Elsevier Inc.  

## 2017-08-23 NOTE — Progress Notes (Addendum)
Subjective:    Patient ID: Darlene Gregory, female    DOB: [birth year], 84 y.o.   MRN: [id number]  HPI  Darlene Gregory is here today for follow up of chronic medical problem.  Outpatient Encounter Medications as of 08/23/2017  Medication Sig  . aspirin EC 81 MG tablet Take 1 tablet (81 mg total) by mouth daily.  . chlorpheniramine-HYDROcodone (TUSSIONEX) 10-8 MG/5ML SUER Take 5 mLs by mouth every 12 (twelve) hours as needed for cough.  . Cholecalciferol (VITAMIN D) 2000 UNITS CAPS Take by mouth daily.  . cloNIDine (CATAPRES) 0.1 MG tablet Take 1 tablet (0.1 mg total) by mouth 2 (two) times daily.  Marland Kitchen FIBER PO Take 2 each by mouth daily.   . fish oil-omega-3 fatty acids 1000 MG capsule Take 1 g by mouth daily.  Marland Kitchen levothyroxine (SYNTHROID, LEVOTHROID) 75 MCG tablet Take 1 tablet (75 mcg total) by mouth daily.  . Multiple Vitamins-Minerals (ICAPS PO) Take 1 tablet by mouth daily.   Marland Kitchen olmesartan-hydrochlorothiazide (BENICAR HCT) 40-25 MG tablet TAKE 1 TABLET BY MOUTH ONCE DAILY  . omeprazole (PRILOSEC) 20 MG capsule Take 1 capsule (20 mg total) by mouth daily.  . tamoxifen (NOLVADEX) 20 MG tablet Take 20 mg by mouth daily.  . vitamin C (ASCORBIC ACID) 500 MG tablet Take 500 mg by mouth daily.     1. Hemorrhoids, unspecified hemorrhoid type  Patient says they are currently not bothering her.  2. Essential hypertension, benign  No c/o chest , sob or headache. Does not check blood pressure at home. BP Readings from Last 3 Encounters:  03/14/17 (!) 175/52  02/22/17 140/82  01/31/17 140/88     3. Diaphragmatic hernia without obstruction and without gangrene  Tries not to over eat which makes her symptoms worse.  4. Gastroesophageal reflux disease without esophagitis  Takes omeprazole daily - very symptomatic when does not take meds  5. Diverticulosis of colon  Patient denies recent flare up.  6. Hypothyroidism due to acquired atrophy of thyroid  No problems that she is aware of.  7.  Age-related osteoporosis without current pathological fracture  denies any weight bearing exercise. No back pain.  8. Malignant neoplasm of upper-outer quadrant of left breast in female, estrogen receptor positive ([place]) has completed all treatments and is currently on tamoxifen  9. Iron deficiency anemia, unspecified iron deficiency anemia type  Admits to some fatigue- due for labs today  10. GAD (generalized anxiety disorder)  Patient is not taking any medication and says that she is occasionally stressed and isable  To handle right now.  11. BMI 32.0-32.9,adult  No recent weight changes  12.    Gout          Has had several flare ups usually due to her diet.  New complaints: None today  Social history: Lives with her husband. He is not doing good right now. Has had trouble with his colon and she has had to take care of him.    Review of Systems  Constitutional: Negative for activity change and appetite change.  HENT: Negative.   Eyes: Negative for pain.  Respiratory: Negative for shortness of breath.   Cardiovascular: Negative for chest pain, palpitations and leg swelling.  Gastrointestinal: Negative for abdominal pain.  Endocrine: Negative for polydipsia.  Genitourinary: Negative.   Skin: Negative for rash.  Neurological: Negative for dizziness, weakness and headaches.  Hematological: Does not bruise/bleed easily.  Psychiatric/Behavioral: Negative.   All other systems reviewed and are negative.  Objective:   Physical Exam  Constitutional: She is oriented to person, place, and time. She appears well-developed and well-nourished.  HENT:  Nose: Nose normal.  Mouth/Throat: Oropharynx is clear and moist.  Eyes: EOM are normal.  Neck: Trachea normal, normal range of motion and full passive range of motion without pain. Neck supple. No JVD present. Carotid bruit is not present. No thyromegaly present.  Cardiovascular: Normal rate, regular rhythm, normal heart sounds and  intact distal pulses. Exam reveals no gallop and no friction rub.  No murmur heard. Pulmonary/Chest: Effort normal and breath sounds normal.  Abdominal: Soft. Bowel sounds are normal. She exhibits no distension and no mass. There is no tenderness.  Musculoskeletal: Normal range of motion. She exhibits edema (2+ bil lower ext).  Lymphadenopathy:    She has no cervical adenopathy.  Neurological: She is alert and oriented to person, place, and time. She has normal reflexes.  Skin: Skin is warm and dry.  Psychiatric: She has a normal mood and affect. Her behavior is normal. Judgment and thought content normal.   BP 116/68   Pulse (!) 53   Temp (!) 97 F (36.1 C) (Oral)   Ht 5\' 3"  (1.6 m)   Wt 187 lb (84.8 kg)   BMI 33.13 kg/m   Left cerumen impaction s/p ear irrigation- tm clear     Assessment & Plan:  1. Hemorrhoids, unspecified hemorrhoid type Continue stool softner if needed  2. Essential hypertension, benign Low sodium diet  3. Diaphragmatic hernia without obstruction and without gangrene Do not over eat  4. Gastroesophageal reflux disease without esophagitis Avoid spicy foods Do not eat 2 hours prior to bedtime  5. Diverticulosis of colon Watch diet to avoid flare up  6. Hypothyroidism due to acquired atrophy of thyroid  7. Age-related osteoporosis without current pathological fracture Weight bearing exercise when can tolerate  8. Malignant neoplasm of upper-outer quadrant of left breast in female, estrogen receptor positive ([place]) Keep follow up appointments with oncology  9. Iron deficiency anemia, unspecified iron deficiency anemia type Labs pending  10. GAD (generalized anxiety disorder) Stress management started on celexa Meds ordered this encounter  Medications  . citalopram (CELEXA) 10 MG tablet    Sig: Take 1 tablet (10 mg total) by mouth daily.    Dispense:  90 tablet    Refill:  1    Order Specific Question:   Supervising Provider    Answer:    VINCENT, CAROL L [[id number]]     98. BMI 32.0-32.9,adult Discussed diet and exercise for person with BMI >25 Will recheck weight in 3-6 months  12. Acute idiopathic gout of multiple sites Avoid red meat and fish which seem to cause her flare ups  13. Peripheral edema - patient did not want to change blood pressure med so she could go on lasix- going to try elevation and compression hose.  Labs pending Health maintenance reviewed Diet and exercise encouraged Continue all meds Follow up  In 3 months   [place], FNP

## 2017-08-24 LAB — ANEMIA PROFILE B
BASOS ABS: 0 10*3/uL (ref 0.0–0.2)
Basos: 1 %
EOS (ABSOLUTE): 0.2 10*3/uL (ref 0.0–0.4)
Eos: 3 %
Ferritin: 51 ng/mL (ref 15–150)
Folate: >20 ng/mL (ref >3.0)
Hematocrit: 33.6 % — ABNORMAL LOW (ref 34.0–46.6)
Hemoglobin: 10.8 g/dL — ABNORMAL LOW (ref 11.1–15.9)
IMMATURE GRANULOCYTES: 0 %
Immature Grans (Abs): 0 10*3/uL (ref 0.0–0.1)
Iron Saturation: 33 % (ref 15–55)
Iron: 99 ug/dL (ref 27–139)
Lymphocytes Absolute: 1.6 10*3/uL (ref 0.7–3.1)
Lymphs: 25 %
MCH: 31.2 pg (ref 26.6–33.0)
MCHC: 32.1 g/dL (ref 31.5–35.7)
MCV: 97 fL (ref 79–97)
MONOCYTES: 9 %
Monocytes Absolute: 0.5 10*3/uL (ref 0.1–0.9)
NEUTROS ABS: 3.8 10*3/uL (ref 1.4–7.0)
NEUTROS PCT: 62 %
PLATELETS: 184 10*3/uL (ref 150–379)
RBC: 3.46 x10E6/uL — AB (ref 3.77–5.28)
RDW: 13.4 % (ref 12.3–15.4)
RETIC CT PCT: 1.3 % (ref 0.6–2.6)
TIBC: 303 ug/dL (ref 250–450)
UIBC: 204 ug/dL (ref 118–369)
VITAMIN B 12: 620 pg/mL (ref 232–1245)
WBC: 6.2 10*3/uL (ref 3.4–10.8)

## 2017-08-24 LAB — CMP14+EGFR
A/G RATIO: 1.6 (ref 1.2–2.2)
ALBUMIN: 4 g/dL (ref 3.5–4.7)
ALK PHOS: 66 IU/L (ref 39–117)
ALT: 11 IU/L (ref 0–32)
AST: 11 IU/L (ref 0–40)
BILIRUBIN TOTAL: 0.3 mg/dL (ref 0.0–1.2)
BUN / CREAT RATIO: 15 (ref 12–28)
BUN: 25 mg/dL (ref 8–27)
CO2: 25 mmol/L (ref 20–29)
CREATININE: 1.66 mg/dL — AB (ref 0.57–1.00)
Calcium: 9.4 mg/dL (ref 8.7–10.3)
Chloride: 103 mmol/L (ref 96–106)
GFR calc Af Amer: 33 mL/min/{1.73_m2} — ABNORMAL LOW (ref >59)
GFR calc non Af Amer: 28 mL/min/{1.73_m2} — ABNORMAL LOW (ref >59)
GLOBULIN, TOTAL: 2.5 g/dL (ref 1.5–4.5)
Glucose: 99 mg/dL (ref 65–99)
POTASSIUM: 4.4 mmol/L (ref 3.5–5.2)
SODIUM: 143 mmol/L (ref 134–144)
Total Protein: 6.5 g/dL (ref 6.0–8.5)

## 2017-08-24 LAB — LIPID PANEL
CHOLESTEROL TOTAL: 145 mg/dL (ref 100–199)
Chol/HDL Ratio: 2.2 ratio (ref 0.0–4.4)
HDL: 65 mg/dL (ref >39)
LDL CALC: 64 mg/dL (ref 0–99)
Triglycerides: 80 mg/dL (ref 0–149)
VLDL Cholesterol Cal: 16 mg/dL (ref 5–40)

## 2017-08-24 LAB — THYROID PANEL WITH TSH
Free Thyroxine Index: 2.4 (ref 1.2–4.9)
T3 UPTAKE RATIO: 27 % (ref 24–39)
T4 TOTAL: 9 ug/dL (ref 4.5–12.0)
TSH: 4.94 u[IU]/mL — AB (ref 0.450–4.500)

## 2017-08-24 LAB — URIC ACID: URIC ACID: 7.2 mg/dL — AB (ref 2.5–7.1)

## 2017-08-29 ENCOUNTER — Telehealth: Payer: Self-pay | Admitting: Nurse Practitioner

## 2017-08-29 NOTE — Telephone Encounter (Signed)
Stable, some anemia. PCP will be back tomorrow to review in more detail.

## 2017-08-29 NOTE — Telephone Encounter (Signed)
Pt aware.

## 2017-09-06 ENCOUNTER — Other Ambulatory Visit: Payer: Self-pay | Admitting: Nurse Practitioner

## 2017-09-06 DIAGNOSIS — K219 Gastro-esophageal reflux disease without esophagitis: Secondary | ICD-10-CM

## 2017-09-12 ENCOUNTER — Other Ambulatory Visit: Payer: Medicare Other

## 2017-09-12 ENCOUNTER — Ambulatory Visit: Payer: Medicare Other | Admitting: Oncology

## 2017-09-15 ENCOUNTER — Other Ambulatory Visit: Payer: Self-pay | Admitting: Obstetrics and Gynecology

## 2017-09-15 DIAGNOSIS — Z1231 Encounter for screening mammogram for malignant neoplasm of breast: Secondary | ICD-10-CM

## 2017-09-22 ENCOUNTER — Other Ambulatory Visit: Payer: Self-pay | Admitting: *Deleted

## 2017-09-22 DIAGNOSIS — C50412 Malignant neoplasm of upper-outer quadrant of left female breast: Secondary | ICD-10-CM

## 2017-09-22 DIAGNOSIS — Z170 Estrogen receptor positive status [ER+]: Principal | ICD-10-CM

## 2017-09-23 NOTE — Progress Notes (Signed)
[name]  [phone] [phone]     ID: Darlene Gregory DOB: 1933-10-24  MR#: [id number]  DVV#:[id number]  Patient Care Team: Chevis Pretty, [facility] as PCP - General (Nurse Practitioner) Excell Seltzer, MD as Consulting Physician (General Surgery) Isaly Fasching, Virgie Dad, MD as Consulting Physician (Oncology) Eppie Gibson, MD as Attending Physician (Radiation Oncology) Arvella Nigh, MD as Consulting Physician (Obstetrics and Gynecology) OTHER MD:  CHIEF COMPLAINT: Estrogen receptor positive breast cancer  CURRENT TREATMENT: Tamoxifen   BREAST CANCER HISTORY: From the original intake note:   Ms. Gregory had screening mammography April 2018 showing a possible mass in the left breast. She was referred to the [facility] where she underwent left mammography and ultrasonography on 09/10/2016. This showed the breast density to be category C. In the upper outer quadrant of the breast there was an area of architectural distortion which was not palpable. Ultrasonography confirmed a 0.9 cm hypoechoic mass at the 1:30 o'clock position of the left breast 3 cm from the nipple. Ultrasound of the left axilla showed a suspicious lymph node.  On 09/24/2016 the patient underwent biopsy of the left breast mass and the left axillary lymph node in question. The lymph node was benign, and this was read as concordant by radiology. The left breast mass however showed invasive ductal carcinoma, grade 2, estrogen receptor 100% positive, progesterone receptor 95% positive, both with strong staining intensity, with an MIB-1 of 5%, and no HER-2 amplification, the signals ratio being 1.46 and the number per cell 1.90.  The patient's subsequent history is as detailed below  INTERVAL HISTORY: Darlene returns today for follow-up and treatment of her estrogen receptor positive breast cancer accompanied by her son, Darlene Gregory. She continues on tamoxifen, with good tolerance. She has  occasional hot flashes and vaginal itching. She uses a vaginal creme to aid this.   She is scheduled for her next routine screening right mamography on 10/12/2017  REVIEW OF SYSTEMS: [name] reports that she has some diffuculty walking outside She has a stationary bike in her basement, but she doesn't use it very often. She stays active with shopping and running errands. She has some occasional bone pain. She only drinks 1-2 bottles of water per day.  She denies unusual headaches, visual changes, nausea, vomiting, or dizziness. There has been no unusual cough, phlegm production, or pleurisy. This been no change in bowel or bladder habits. She denies unexplained fatigue or unexplained weight loss, bleeding, rash, or fever. A detailed review of systems was otherwise stable.    PAST MEDICAL HISTORY: Past Medical History:  Diagnosis Date  . Cancer ([place]) 10/2016   left breast cancer  . Difficult intubation    "small trachea"; 01/18/05: glidescope but unable to pass stylet -->fast track LMA with blind passage for ETT, consider awake intubation; 08/14/08: IV induction and DL with glidesecope for ETT  . DJD (degenerative joint disease)   . Frequent urination   . GERD (gastroesophageal reflux disease)   . Hypertension   . Hypothyroidism   . Osteoporosis   . PONV (postoperative nausea and vomiting)   . Thyroid disease     PAST SURGICAL HISTORY: Past Surgical History:  Procedure Laterality Date  . ABDOMINAL HYSTERECTOMY    . BREAST LUMPECTOMY WITH RADIOACTIVE SEED LOCALIZATION Left 10/29/2016   Procedure: LEFT BREAST LUMPECTOMY WITH RADIOACTIVE SEED LOCALIZATION;  Surgeon: Excell Seltzer, MD;  Location: [facility];  Service: General;  Laterality: Left;  . CAROTID BODY TUMOR EXCISION    .  COLONOSCOPY W/ POLYPECTOMY    . EYE SURGERY Bilateral    cataract removed  . JOINT REPLACEMENT Right 2006   knee  . PAROTIDECTOMY Left   . RE-EXCISION OF BREAST LUMPECTOMY Left 11/05/2016    Procedure: RE-EXCISION OF LEFT BREAST LUMPECTOMY;  Surgeon: Excell Seltzer, MD;  Location: [facility];  Service: General;  Laterality: Left;  . TOTAL KNEE ARTHROPLASTY Left 03/13/2014   Procedure: LEFT TOTAL KNEE ARTHROPLASTY;  Surgeon: Kerin Salen, MD;  Location: [facility];  Service: Orthopedics;  Laterality: Left;  . TOTAL MASTECTOMY Left 11/12/2016   Procedure: LEFT TOTAL MASTECTOMY;  Surgeon: Excell Seltzer, MD;  Location: [facility];  Service: General;  Laterality: Left;    FAMILY HISTORY Family History  Problem Relation Age of Onset  . Heart disease Mother   . Emphysema Father   The patient's father died at the age of 84 from emphysema. The patient's mother died from "old age" at 50. The patient had one brother, 3 sisters. There is no history of breast cancer in the family. One sister had "a female cancer", but the patient is not sure what it may have been.  GYNECOLOGIC HISTORY:  No LMP recorded. Patient has had a hysterectomy. The patient is status post total abdominal hysterectomy with bilateral salpingo-oophorectomy remotely. She is GX P2, first live birth age 89. She never used oral contraceptives.  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 Her husband [name] has been a Psychologist, sport and exercise. They currently have 41 acres. Son Darlene Gregory, 56, lives in [place] and works for [employer]. Son Darlene Gregory lives in [place] and works for [employer]. The patient has 2 grandchildren and some great-grandchildren. She is a Psychologist, forensic.    ADVANCED DIRECTIVES: In place   HEALTH MAINTENANCE: Social History   Tobacco Use  . Smoking status: Former Research scientist (life sciences)  . Smokeless tobacco: Never Used  Substance Use Topics  . Alcohol use: No  . Drug use: No     Colonoscopy: Repeat due 2019/[facility]  PAP:  Bone density: 04/20/2013, T score -2.3   Allergies  Allergen Reactions  . Hyoscyamine Sulfate     REACTION: rash  . Norvasc  [Amlodipine Besylate] Swelling  . Phenazopyridine Hcl Rash  . Sulfonamide Derivatives Rash    Current Outpatient Medications  Medication Sig Dispense Refill  . aspirin EC 81 MG tablet Take 1 tablet (81 mg total) by mouth daily. 100 tablet 4  . Cholecalciferol (VITAMIN D) 2000 UNITS CAPS Take by mouth daily.    . citalopram (CELEXA) 10 MG tablet Take 1 tablet (10 mg total) by mouth daily. 90 tablet 1  . cloNIDine (CATAPRES) 0.1 MG tablet Take 1 tablet (0.1 mg total) by mouth 2 (two) times daily. 180 tablet 0  . FIBER PO Take 2 each by mouth daily.     . fish oil-omega-3 fatty acids 1000 MG capsule Take 1 g by mouth daily.    Marland Kitchen levothyroxine (SYNTHROID, LEVOTHROID) 75 MCG tablet Take 1 tablet (75 mcg total) by mouth daily. 90 tablet 2  . Multiple Vitamins-Minerals (ICAPS PO) Take 1 tablet by mouth daily.     Marland Kitchen olmesartan-hydrochlorothiazide (BENICAR HCT) 40-25 MG tablet TAKE 1 TABLET BY MOUTH ONCE DAILY 90 tablet 1  . omeprazole (PRILOSEC) 20 MG capsule Take 1 capsule (20 mg total) by mouth daily. 90 capsule 1  . omeprazole (PRILOSEC) 20 MG capsule TAKE 1 CAPSULE BY MOUTH ONCE DAILY 90 capsule 1  .  tamoxifen (NOLVADEX) 20 MG tablet Take 20 mg by mouth daily.    . vitamin C (ASCORBIC ACID) 500 MG tablet Take 500 mg by mouth daily.     No current facility-administered medications for this visit.     OBJECTIVE: Elderly white woman in no acute distress  Vitals:   09/26/17 1003  BP: (!) 159/52  Pulse: 70  Resp: 18  Temp: 98 F (36.7 C)  SpO2: 99%     Body mass index is 32.93 kg/m.    ECOG FS:1 - Symptomatic but completely ambulatory  Sclerae unicteric, pupils round and equal No cervical or supraclavicular adenopathy Lungs no rales or rhonchi Heart regular rate and rhythm Abd soft, obese, nontender, positive bowel sounds MSK no focal spinal tenderness, no upper extremity lymphedema Neuro: nonfocal, well oriented, appropriate affect Breasts: The right breast is unremarkable.  The  left breast is status post mastectomy with no evidence of chest wall recurrence.  Both axillae are benign.  LAB RESULTS:  CMP     Component Value Date/Time   NA 143 08/23/2017 0857   NA 141 03/14/2017 1056   K 4.4 08/23/2017 0857   K 4.6 03/14/2017 1056   CL 103 08/23/2017 0857   CO2 25 08/23/2017 0857   CO2 28 03/14/2017 1056   GLUCOSE 99 08/23/2017 0857   GLUCOSE 90 03/14/2017 1056   BUN 25 08/23/2017 0857   BUN 29.9 (H) 03/14/2017 1056   CREATININE 1.66 (H) 08/23/2017 0857   CREATININE 1.8 (H) 03/14/2017 1056   CALCIUM 9.4 08/23/2017 0857   CALCIUM 9.4 03/14/2017 1056   PROT 6.5 08/23/2017 0857   PROT 6.6 03/14/2017 1056   ALBUMIN 4.0 08/23/2017 0857   ALBUMIN 3.4 (L) 03/14/2017 1056   AST 11 08/23/2017 0857   AST 14 03/14/2017 1056   ALT 11 08/23/2017 0857   ALT 11 03/14/2017 1056   ALKPHOS 66 08/23/2017 0857   ALKPHOS 69 03/14/2017 1056   BILITOT 0.3 08/23/2017 0857   BILITOT 0.38 03/14/2017 1056   GFRNONAA 28 (L) 08/23/2017 0857   GFRNONAA 49 (L) 11/24/2012 0823   GFRAA 33 (L) 08/23/2017 0857   GFRAA 56 (L) 11/24/2012 0823    No results found for: TOTALPROTELP, ALBUMINELP, A1GS, A2GS, BETS, BETA2SER, GAMS, MSPIKE, SPEI  No results found for: Nils Pyle, [facility]  Lab Results  Component Value Date   WBC 6.0 09/26/2017   NEUTROABS 3.7 09/26/2017   HGB 10.7 (L) 09/26/2017   HCT 32.2 (L) 09/26/2017   MCV 95.4 09/26/2017   PLT 158 09/26/2017      Chemistry      Component Value Date/Time   NA 143 08/23/2017 0857   NA 141 03/14/2017 1056   K 4.4 08/23/2017 0857   K 4.6 03/14/2017 1056   CL 103 08/23/2017 0857   CO2 25 08/23/2017 0857   CO2 28 03/14/2017 1056   BUN 25 08/23/2017 0857   BUN 29.9 (H) 03/14/2017 1056   CREATININE 1.66 (H) 08/23/2017 0857   CREATININE 1.8 (H) 03/14/2017 1056      Component Value Date/Time   CALCIUM 9.4 08/23/2017 0857   CALCIUM 9.4 03/14/2017 1056   ALKPHOS 66 08/23/2017 0857   ALKPHOS 69 03/14/2017  1056   AST 11 08/23/2017 0857   AST 14 03/14/2017 1056   ALT 11 08/23/2017 0857   ALT 11 03/14/2017 1056   BILITOT 0.3 08/23/2017 0857   BILITOT 0.38 03/14/2017 1056       No results found for: LABCA2  No components found for: BTVDFP792  No results for input(s): INR in the last 168 hours.  Urinalysis    Component Value Date/Time   APPEARANCEUR Clear 11/27/2015 1132   GLUCOSEU Negative 11/27/2015 1132   BILIRUBINUR Negative 11/27/2015 1132   PROTEINUR Negative 11/27/2015 1132   NITRITE Negative 11/27/2015 1132   LEUKOCYTESUR 1+ (A) 11/27/2015 1132     STUDIES: She is scheduled for routine screening right mamography on 10/12/2017  ELIGIBLE FOR AVAILABLE RESEARCH PROTOCOL: no  ASSESSMENT: 84 y.o. [place], [place] woman status post left breast upper outer quadrant biopsy 09/24/2016 for a clinical T1B N0, stage IA invasive ductal carcinoma, grade 2, estrogen and progesterone receptor positive, HER-2 not amplified, with an MIB-1 of 5%  (1) Status post left lumpectomy 10/29/2016 for a pT1c pNX, stage IA invasive ductal carcinoma, grade 2, with positive margins  (a) additional surgery 11/05/2016 removed additional ductal carcinoma in situ, but margins were still positive  (b) left mastectomy with sentinel lymph node sampling 11/12/2016 showed pT1s pN0, with negative margins  (2) adjuvant radiation not indicated  (3) tamoxifen started 11/30/2016.  (a) bone density December 2014 shows a T score of -2.3  PLAN: Venus is now just about a year out from definitive surgery for her breast cancer with no evidence of disease recurrence.  This is very favorable.  She is tolerating tamoxifen without any unusual side effects.  She will continue that for a total of 5 years  For the vaginal dryness I have suggested in addition to what she is doing now she can consider coconut oil.  We went over her anemia, which is going to be due to her chronic kidney disease.  We explained the  relationship between the kidneys, erythropoietin production, and the bone marrow function.  So long as her kidneys do not get any worse, she should be able to keep her hemoglobin above 10 which means she will not have symptoms from the anemia.  However if the kidneys continue to decline her anemia will worsen and she might need supplementation at some point  My strong recommendation to her is that she drink a minimum of a quart of fluid a day preferably 2 quarts daily  She will see me again next year, mid June, after next year's mammography.  She will call with any issues that may develop before the next visit. Anyah Swallow, Virgie Dad, MD  09/26/17 10:20 AM Medical Oncology and Hematology [facility] [address] [place],  [address] Tel. [phone]    Fax. [phone]  This document serves as a record of services personally performed by Lurline Del, MD. It was created on his behalf by Sheron Nightingale, a trained medical scribe. The creation of this record is based on the scribe's personal observations and the provider's statements to them.   I have reviewed the above documentation for accuracy and completeness, and I agree with the above.

## 2017-09-26 ENCOUNTER — Telehealth: Payer: Self-pay | Admitting: Oncology

## 2017-09-26 ENCOUNTER — Inpatient Hospital Stay: Payer: Medicare Other | Attending: Oncology

## 2017-09-26 ENCOUNTER — Inpatient Hospital Stay: Payer: Medicare Other | Admitting: Oncology

## 2017-09-26 VITALS — BP 159/52 | HR 70 | Temp 98.0°F | Resp 18 | Ht 63.0 in | Wt 185.9 lb

## 2017-09-26 DIAGNOSIS — Z7982 Long term (current) use of aspirin: Secondary | ICD-10-CM | POA: Insufficient documentation

## 2017-09-26 DIAGNOSIS — Z87891 Personal history of nicotine dependence: Secondary | ICD-10-CM | POA: Insufficient documentation

## 2017-09-26 DIAGNOSIS — Z170 Estrogen receptor positive status [ER+]: Secondary | ICD-10-CM | POA: Diagnosis not present

## 2017-09-26 DIAGNOSIS — N951 Menopausal and female climacteric states: Secondary | ICD-10-CM

## 2017-09-26 DIAGNOSIS — C50412 Malignant neoplasm of upper-outer quadrant of left female breast: Secondary | ICD-10-CM | POA: Diagnosis not present

## 2017-09-26 DIAGNOSIS — N189 Chronic kidney disease, unspecified: Secondary | ICD-10-CM | POA: Diagnosis not present

## 2017-09-26 DIAGNOSIS — I129 Hypertensive chronic kidney disease with stage 1 through stage 4 chronic kidney disease, or unspecified chronic kidney disease: Secondary | ICD-10-CM

## 2017-09-26 DIAGNOSIS — D631 Anemia in chronic kidney disease: Secondary | ICD-10-CM

## 2017-09-26 DIAGNOSIS — Z79810 Long term (current) use of selective estrogen receptor modulators (SERMs): Secondary | ICD-10-CM | POA: Insufficient documentation

## 2017-09-26 DIAGNOSIS — N183 Chronic kidney disease, stage 3 unspecified: Secondary | ICD-10-CM

## 2017-09-26 DIAGNOSIS — Z79899 Other long term (current) drug therapy: Secondary | ICD-10-CM

## 2017-09-26 LAB — CMP (CANCER CENTER ONLY)
ALT: 15 U/L (ref 0–55)
ANION GAP: 8 (ref 3–11)
AST: 15 U/L (ref 5–34)
Albumin: 3.5 g/dL (ref 3.5–5.0)
Alkaline Phosphatase: 62 U/L (ref 40–150)
BILIRUBIN TOTAL: 0.3 mg/dL (ref 0.2–1.2)
BUN: 31 mg/dL — ABNORMAL HIGH (ref 7–26)
CALCIUM: 8.9 mg/dL (ref 8.4–10.4)
CO2: 26 mmol/L (ref 22–29)
Chloride: 106 mmol/L (ref 98–109)
Creatinine: 1.83 mg/dL — ABNORMAL HIGH (ref 0.60–1.10)
GFR, EST NON AFRICAN AMERICAN: 25 mL/min — AB (ref >60)
GFR, Est AFR Am: 28 mL/min — ABNORMAL LOW (ref >60)
Glucose, Bld: 99 mg/dL (ref 70–140)
POTASSIUM: 4.2 mmol/L (ref 3.5–5.1)
Sodium: 140 mmol/L (ref 136–145)
TOTAL PROTEIN: 6.3 g/dL — AB (ref 6.4–8.3)

## 2017-09-26 LAB — CBC WITH DIFFERENTIAL (CANCER CENTER ONLY)
BASOS ABS: 0.1 10*3/uL (ref 0.0–0.1)
BASOS PCT: 1 %
EOS PCT: 3 %
Eosinophils Absolute: 0.2 10*3/uL (ref 0.0–0.5)
HEMATOCRIT: 32.2 % — AB (ref 34.8–46.6)
Hemoglobin: 10.7 g/dL — ABNORMAL LOW (ref 11.6–15.9)
LYMPHS PCT: 25 %
Lymphs Abs: 1.5 10*3/uL (ref 0.9–3.3)
MCH: 31.8 pg (ref 25.1–34.0)
MCHC: 33.4 g/dL (ref 31.5–36.0)
MCV: 95.4 fL (ref 79.5–101.0)
Monocytes Absolute: 0.5 10*3/uL (ref 0.1–0.9)
Monocytes Relative: 9 %
NEUTROS ABS: 3.7 10*3/uL (ref 1.5–6.5)
Neutrophils Relative %: 62 %
PLATELETS: 158 10*3/uL (ref 145–400)
RBC: 3.37 MIL/uL — AB (ref 3.70–5.45)
RDW: 13.1 % (ref 11.2–14.5)
WBC: 6 10*3/uL (ref 3.9–10.3)

## 2017-09-26 NOTE — Telephone Encounter (Signed)
Gave patient AVs and calendar of upcoming June 2020 appointments

## 2017-10-12 ENCOUNTER — Ambulatory Visit
Admission: RE | Admit: 2017-10-12 | Discharge: 2017-10-12 | Disposition: A | Discharge status: Home / Self Care | Payer: Medicare Other | Source: Ambulatory Visit | Attending: Obstetrics and Gynecology | Admitting: Obstetrics and Gynecology

## 2017-10-12 ENCOUNTER — Telehealth: Payer: Self-pay | Admitting: Nurse Practitioner

## 2017-10-12 ENCOUNTER — Ambulatory Visit: Payer: Medicare Other | Admitting: *Deleted

## 2017-10-12 DIAGNOSIS — Z1231 Encounter for screening mammogram for malignant neoplasm of breast: Secondary | ICD-10-CM

## 2017-10-13 ENCOUNTER — Ambulatory Visit
Admission: RE | Admit: 2017-10-13 | Discharge: 2017-10-13 | Disposition: A | Discharge status: Home / Self Care | Payer: Medicare Other | Source: Ambulatory Visit | Attending: Obstetrics and Gynecology | Admitting: Obstetrics and Gynecology

## 2017-10-13 ENCOUNTER — Other Ambulatory Visit: Payer: Self-pay | Admitting: Obstetrics and Gynecology

## 2017-10-13 DIAGNOSIS — R928 Other abnormal and inconclusive findings on diagnostic imaging of breast: Secondary | ICD-10-CM

## 2017-10-13 NOTE — Telephone Encounter (Signed)
Mailed 10/13/17

## 2017-10-13 NOTE — Telephone Encounter (Signed)
Labs mailed were 08/2017 and ordered by MMM. Explained to pt that we could not mail labs not ordered by [place]

## 2017-10-17 ENCOUNTER — Encounter: Payer: Self-pay | Admitting: *Deleted

## 2017-10-17 ENCOUNTER — Ambulatory Visit (INDEPENDENT_AMBULATORY_CARE_PROVIDER_SITE_OTHER): Payer: Medicare Other | Admitting: *Deleted

## 2017-10-17 VITALS — BP 154/64 | HR 57 | Ht 61.5 in | Wt 181.0 lb

## 2017-10-17 DIAGNOSIS — Z0000 Encounter for general adult medical examination without abnormal findings: Secondary | ICD-10-CM

## 2017-10-17 NOTE — Patient Instructions (Signed)
  Darlene Gregory , Thank you for taking time to come for your Medicare Wellness Visit. I appreciate your ongoing commitment to your health goals. Please review the following plan we discussed and let me know if I can assist you in the future.   These are the goals we discussed: Goals    . DIET - INCREASE WATER INTAKE     Drink 1.5 to 2 quarts of water a day     . Exercise 150 min/wk Moderate Activity       This is a list of the screening recommended for you and due dates:  Health Maintenance  Topic Date Due  . Colon Cancer Screening  04/02/2018  . Mammogram  10/13/2018  . Tetanus Vaccine  11/06/2023  . DEXA scan (bone density measurement)  Completed  . Pneumonia vaccines  Completed

## 2017-10-17 NOTE — Progress Notes (Addendum)
Subjective:   Darlene Gregory is a 84 y.o. female who presents for a Medicare Annual Wellness Visit. [name] lives at home with her husband.  She has two adult children, 2 grandchildren, and one great grandchild.   Review of Systems    Patient reports that her health is unchanged compared to last year.  Cardiac Risk Factors include: advanced age (>68men, >80 women);hypertension;sedentary lifestyle  Other systems negative unless otherwise noted in other area.  Current Medications (verified) Outpatient Encounter Medications as of 10/17/2017  Medication Sig  . aspirin EC 81 MG tablet Take 1 tablet (81 mg total) by mouth daily.  . Cholecalciferol (VITAMIN D) 2000 UNITS CAPS Take by mouth daily.  . cloNIDine (CATAPRES) 0.1 MG tablet Take 1 tablet (0.1 mg total) by mouth 2 (two) times daily.  Marland Kitchen FIBER PO Take 2 each by mouth daily.   . fish oil-omega-3 fatty acids 1000 MG capsule Take 1 g by mouth daily.  Marland Kitchen levothyroxine (SYNTHROID, LEVOTHROID) 75 MCG tablet Take 1 tablet (75 mcg total) by mouth daily.  . Multiple Vitamins-Minerals (ICAPS PO) Take 1 tablet by mouth daily.   Marland Kitchen olmesartan-hydrochlorothiazide (BENICAR HCT) 40-25 MG tablet TAKE 1 TABLET BY MOUTH ONCE DAILY  . omeprazole (PRILOSEC) 20 MG capsule Take 1 capsule (20 mg total) by mouth daily.  Marland Kitchen omeprazole (PRILOSEC) 20 MG capsule TAKE 1 CAPSULE BY MOUTH ONCE DAILY  . tamoxifen (NOLVADEX) 20 MG tablet Take 20 mg by mouth daily.  . vitamin C (ASCORBIC ACID) 500 MG tablet Take 500 mg by mouth daily.  . citalopram (CELEXA) 10 MG tablet Take 1 tablet (10 mg total) by mouth daily. (Patient not taking: Reported on 10/17/2017)   No facility-administered encounter medications on file as of 10/17/2017.     Allergies (verified) Hyoscyamine sulfate; Norvasc [amlodipine besylate]; Phenazopyridine hcl; and Sulfonamide derivatives   History: Past Medical History:  Diagnosis Date  . Cancer ([place]) 10/2016   left breast cancer  . Difficult  intubation    "small trachea"; 01/18/05: glidescope but unable to pass stylet -->fast track LMA with blind passage for ETT, consider awake intubation; 08/14/08: IV induction and DL with glidesecope for ETT  . DJD (degenerative joint disease)   . Frequent urination   . GERD (gastroesophageal reflux disease)   . Hypertension   . Hypothyroidism   . Osteoporosis   . PONV (postoperative nausea and vomiting)   . Thyroid disease    Past Surgical History:  Procedure Laterality Date  . ABDOMINAL HYSTERECTOMY    . BREAST LUMPECTOMY WITH RADIOACTIVE SEED LOCALIZATION Left 10/29/2016   Procedure: LEFT BREAST LUMPECTOMY WITH RADIOACTIVE SEED LOCALIZATION;  Surgeon: Excell Seltzer, MD;  Location: [facility];  Service: General;  Laterality: Left;  . CAROTID BODY TUMOR EXCISION    . COLONOSCOPY W/ POLYPECTOMY    . EYE SURGERY Bilateral    cataract removed  . JOINT REPLACEMENT Right 2006   knee  . MASTECTOMY Left 2018  . PAROTIDECTOMY Left   . RE-EXCISION OF BREAST LUMPECTOMY Left 11/05/2016   Procedure: RE-EXCISION OF LEFT BREAST LUMPECTOMY;  Surgeon: Excell Seltzer, MD;  Location: [facility];  Service: General;  Laterality: Left;  . TOTAL KNEE ARTHROPLASTY Left 03/13/2014   Procedure: LEFT TOTAL KNEE ARTHROPLASTY;  Surgeon: Kerin Salen, MD;  Location: [facility];  Service: Orthopedics;  Laterality: Left;  . TOTAL MASTECTOMY Left 11/12/2016   Procedure: LEFT TOTAL MASTECTOMY;  Surgeon: Excell Seltzer, MD;  Location: [facility];  Service: General;  Laterality: Left;   Family History  Problem Relation Age of Onset  . Heart disease Mother   . Emphysema Father   . Cancer Sister        ovarian   Social History   Socioeconomic History  . Marital status: Married    Spouse name: Not on file  . Number of children: 2  . Years of education: 73  . Highest education level: 12th grade  Occupational History  . 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 Financial resource strain: Not hard at all  . Food insecurity:    Worry: Never true    Inability: Never true  . Transportation needs:    Medical: No    Non-medical: No  Tobacco Use  . Smoking status: Former Research scientist (life sciences)  . Smokeless tobacco: Never Used  Substance and Sexual Activity  . Alcohol use: No  . Drug use: No  . Sexual activity: Not Currently  Lifestyle  . Physical activity:    Days per week: 3 days    Minutes per session: 60 min  . Stress: Only a little  Relationships  . Social connections:    Talks on phone: More than three times a week    Gets together: More than three times a week    Attends religious service: More than 4 times per year    Active member of club or organization: Yes    Attends meetings of clubs or organizations: More than 4 times per year    Relationship status: Married  Other Topics Concern  . Not on file  Social History Narrative  . Not on file    Tobacco Use No.  Clinical Intake:  Pre-visit preparation completed: No  Pain : No/denies pain     Nutritional Status: BMI 25 -29 Overweight Diabetes: No  How often do you need to have someone help you when you read instructions, pamphlets, or other written materials from your doctor or pharmacy?: 1 - Never What is the last grade level you completed in school?: 12th grade  Interpreter Needed?: No  Information entered by :: Chong Sicilian, RN   Activities of Daily Living In your present state of health, do you have any difficulty performing the following activities: 10/17/2017 11/12/2016  Hearing? N N  Vision? Y N  Comment Has yearly eye exams and sees retina specialist every 6 months -  Difficulty concentrating or making decisions? N N  Comment No major changes. Has some difficulty with names.  -  Walking or climbing stairs? N N  Dressing or bathing? N N  Doing errands, shopping? N -  Preparing Food and eating ? N -  Using the Toilet? N -  In the past six months,  have you accidently leaked urine? N -  Do you have problems with loss of bowel control? N -  Managing your Medications? N -  Comment uses a pill box to organize medications -  Managing your Finances? N -  Housekeeping or managing your Housekeeping? N -  Some recent data might be hidden     Immunizations and Health Maintenance Immunization History  Administered Date(s) Administered  . Pneumococcal Conjugate-13 02/13/2014  . Tdap 11/05/2013  . Zoster Recombinat (Shingrix) 09/28/2016   There are no preventive care reminders to display for this patient.  Diet Typically eats 3 meals a day. Usually eats 3 meals a day. Eats most meals at home but does eat out on occasion.  Exercise Current Exercise Habits: The patient does not participate in regular exercise at present, Exercise limited by: None identified has an exercise bike in the basement but doesn't use it often. Plans on increasing usage.    Depression Screen PHQ 2/9 Scores 10/17/2017 08/23/2017 01/31/2017 09/28/2016 06/17/2016 06/14/2016 03/30/2016  PHQ - 2 Score 0 0 0 0 0 0 0     Fall Risk Fall Risk  10/17/2017 08/23/2017 01/31/2017 09/28/2016 06/17/2016  Falls in the past year? No No No No No  Number falls in past yr: - - - - -  Injury with Fall? - - - - -    Safety Is the patient's home free of loose throw rugs in walkways, pet beds, electrical cords, etc?   yes      Grab bars in the bathroom? yes      Walkin shower? yes      Shower Seat? no      Handrails on the stairs?   yes      Adequate lighting?   yes  Patient Care Team: Chevis Pretty, FNP as PCP - General (Nurse Practitioner) Excell Seltzer, MD as Consulting Physician (General Surgery) Magrinat, Virgie Dad, MD as Consulting Physician (Oncology) Eppie Gibson, MD as Attending Physician (Radiation Oncology) Arvella Nigh, MD as Consulting Physician (Obstetrics and Gynecology)  Hospitalization 11/2016 for mastectomy due to breast cancer. No other  hospitalizations, ER visits, or surgeries.   Objective:    Today's Vitals   10/17/17 0844  BP: (!) 154/64  Pulse: (!) 57  Weight: 181 lb (82.1 kg)  Height: 5' 1.5" (1.562 m)   Body mass index is 33.65 kg/m.  Advanced Directives 10/17/2017 11/12/2016 11/09/2016 11/05/2016 11/03/2016 10/25/2016 05/27/2014  Does Patient Have a Medical Advance Directive? Yes Yes Yes No No No Yes  Type of Paramedic of [place];Living will [place];Living will - - - - -  Does patient want to make changes to medical advance directive? No - Patient declined No - Patient declined - - - - -  Copy of [place] in Chart? No - copy requested No - copy requested No - copy requested - - - No - copy requested  Would patient like information on creating a medical advance directive? - No - Patient declined - No - Patient declined - - -    Hearing/Vision  normal or No deficits noted during visit.  Cognitive Function: MMSE - Mini Mental State Exam 10/17/2017  Orientation to time 4  Orientation to Place 5  Registration 3  Attention/ Calculation 4  Recall 2  Language- name 2 objects 2  Language- repeat 1  Language- follow 3 step command 3  Language- read & follow direction 1  Write a sentence 1  Copy design 1  Total score 27       Normal Cognitive Function Screening: Yes      Assessment:   This is a routine wellness examination for [place].    Plan:    Goals    . DIET - INCREASE WATER INTAKE     Drink 1.5 to 2 quarts of water a day     . Exercise 150 min/wk Moderate Activity       Keep f/u with Chevis Pretty, FNP and any other specialty appointments you may have Continue current medications Move carefully to avoid falls. Use assistive devices like a can or walker if needed. Aim for at least 150 minutes of moderate activity a week. This can  be done with chair exercises if necessary. Read or work on puzzles daily Stay connected with  friends and family  Review and return a signed, witnessed, and notarized copy of Advance Directives if given.  Health Maintenance: Tdap Vaccine recommended: no Zostavax (Shingles vaccine) recommended:no Prevnar or Pneumovax (pneumonia vaccines) recommended:no  Cancer Screenings: Lung: Low Dose CT Chest recommended if Age 63-80 years, 30 pack-year currently smoking OR have quit w/in 15years. Patient does not qualify. Colon cancer screening recommended: considering having another colonoscopy. Due in 2020.  Mammogram recommended:no Pap Smear recommended: no  Additional Screenings Hepatitis C Screening recommended: no Dexa Scan recommended: 2020 Diabetic Eye Exam recommended: no  No orders of the defined types were placed in this encounter.   I have personally reviewed and noted the following in the patient's chart:   . Medical and social history . Use of alcohol, tobacco or illicit drugs  . Current medications and supplements . Functional ability and status . Nutritional status . Physical activity . Advanced directives . List of other physicians . Hospitalizations, surgeries, and ER visits in previous 12 months . Vitals . Screenings to include cognitive, depression, and falls . Referrals and appointments  In addition, I have reviewed and discussed with patient certain preventive protocols, quality metrics, and best practice recommendations. A written personalized care plan for preventive services as well as general preventive health recommendations were provided to patient.     Chong Sicilian, RN   10/17/2017    I have reviewed and agree with the above AWV documentation.   Assunta Found, MD [facility] 10/19/2017, 2:12 PM

## 2017-10-18 ENCOUNTER — Other Ambulatory Visit: Payer: Medicare Other

## 2017-11-01 IMAGING — MG NEEDLE LOCALIZATION OF THE LEFT BREAST WITH MAMMO GUIDANCE
6 series · 6 of 6 positions shown · non-contrast
Comparison: Previous exam(s).

CLINICAL DATA: 83-year-old female for seed localization of a left
breast biopsy site demonstrating invasive ductal carcinoma

EXAM:
MAMMOGRAPHIC GUIDED RADIOACTIVE SEED LOCALIZATION OF THE LEFT BREAST

[L LM (1 of 3)]
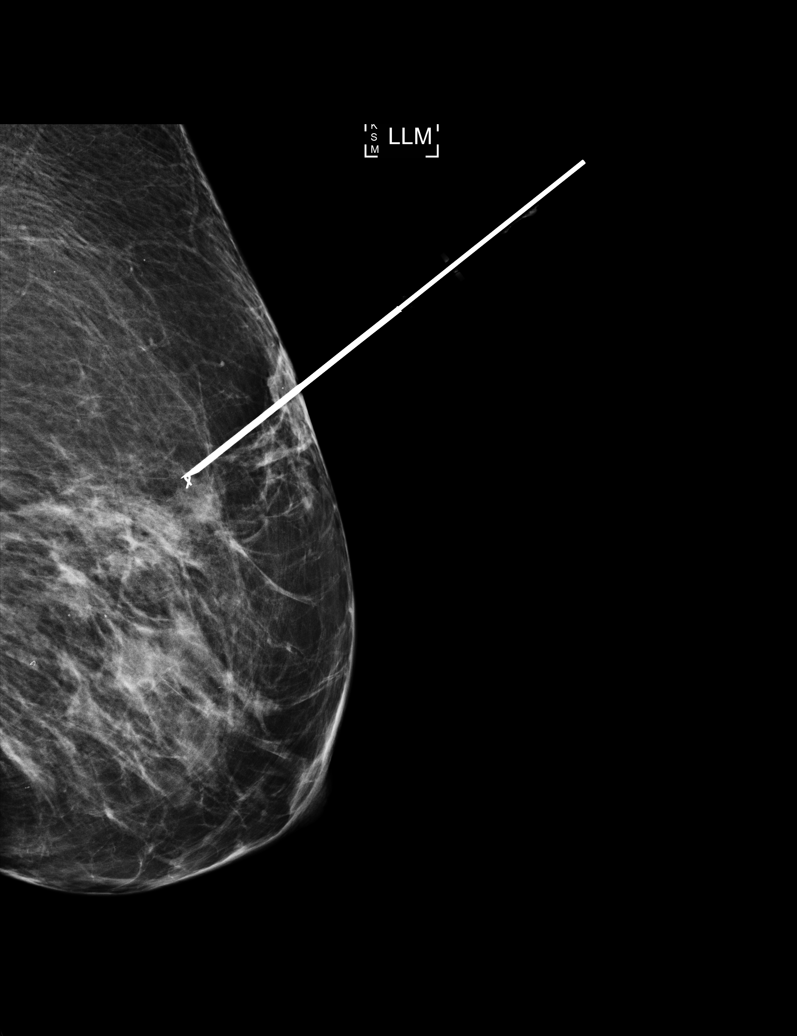

[L CC (1 of 3)]
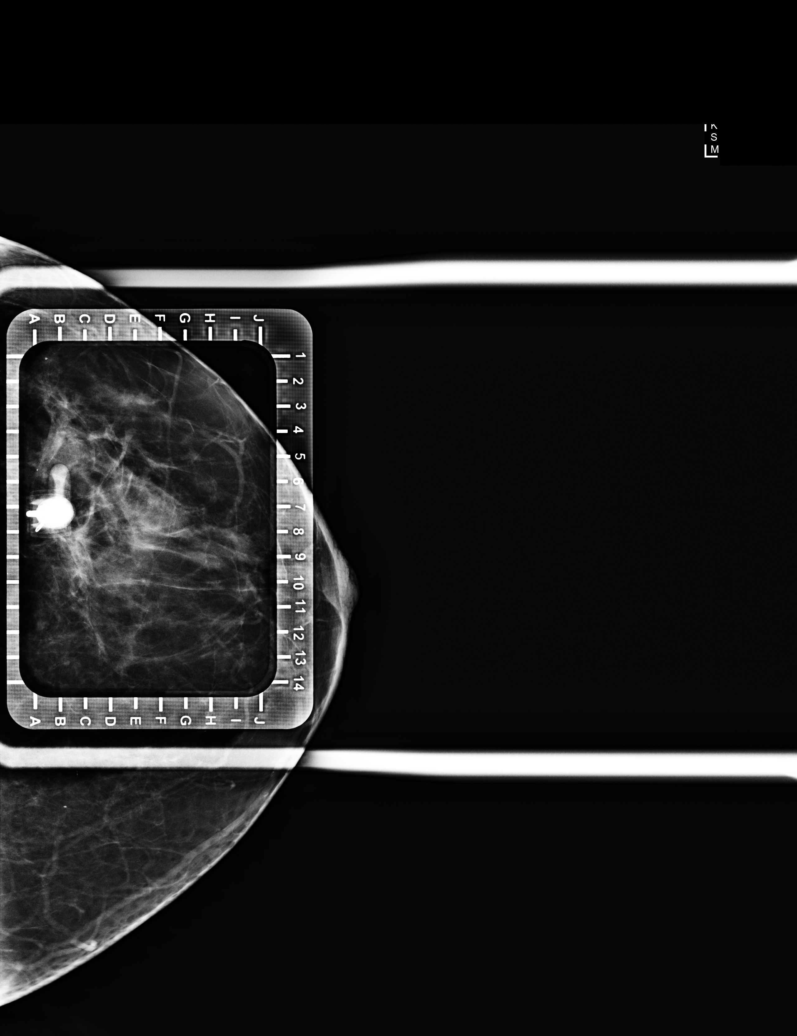

[L CC (2 of 3)]
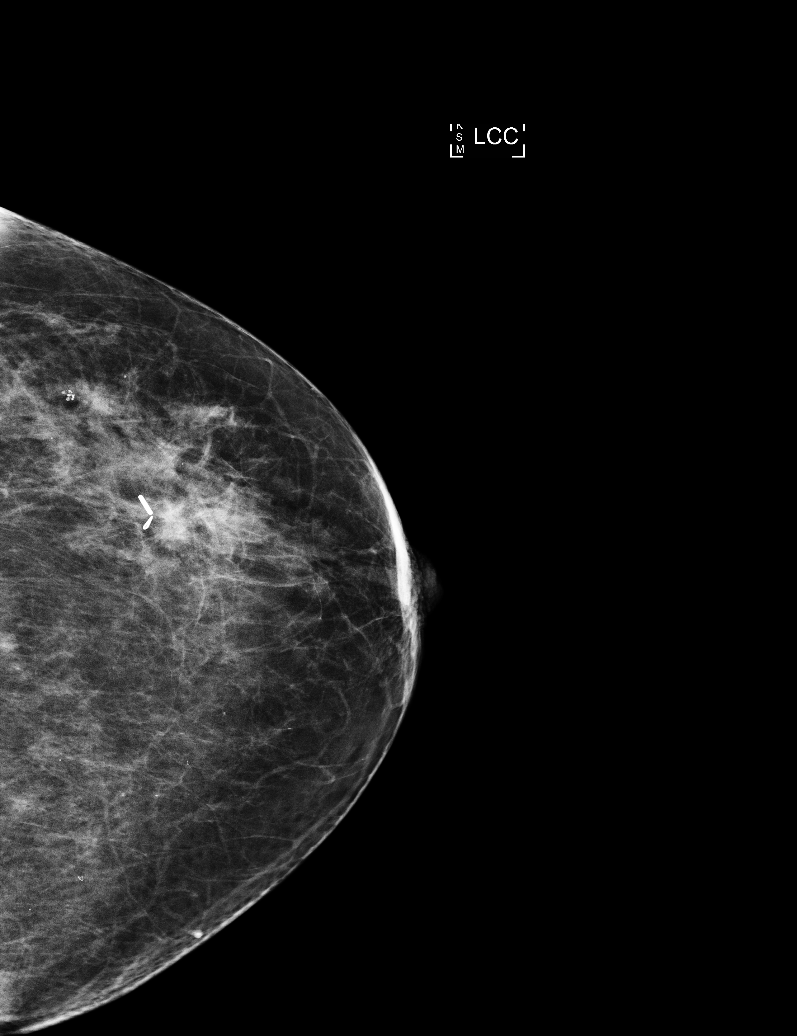

[L CC (3 of 3)]
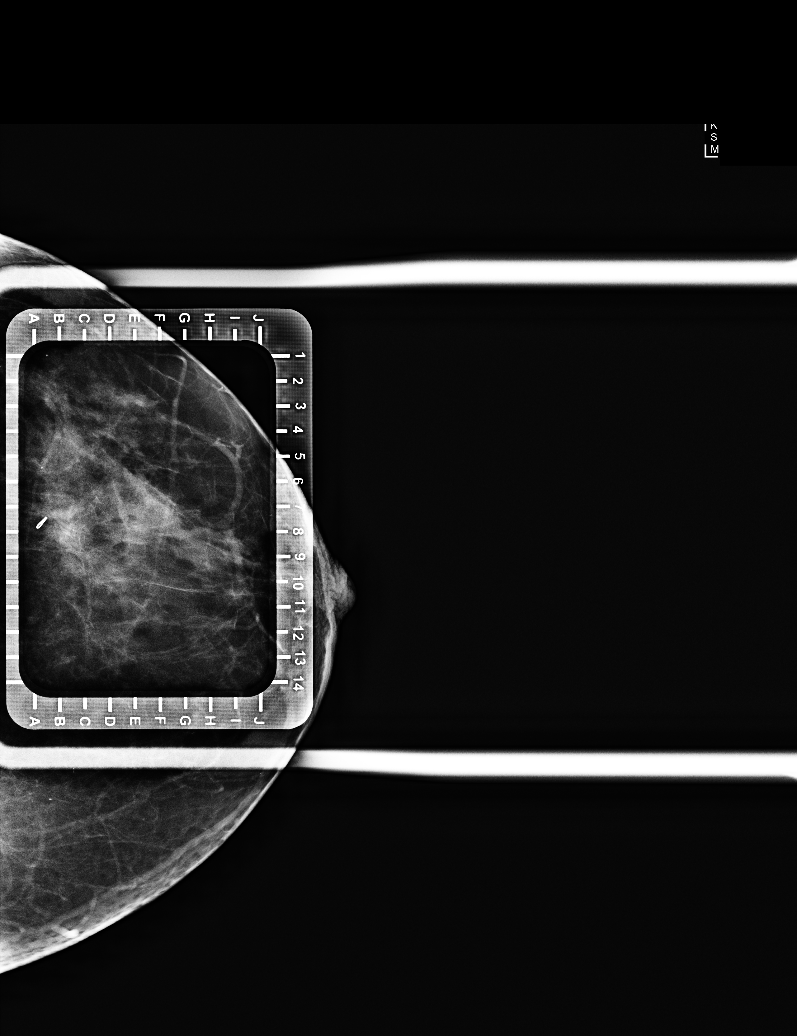

[L LM (2 of 3)]
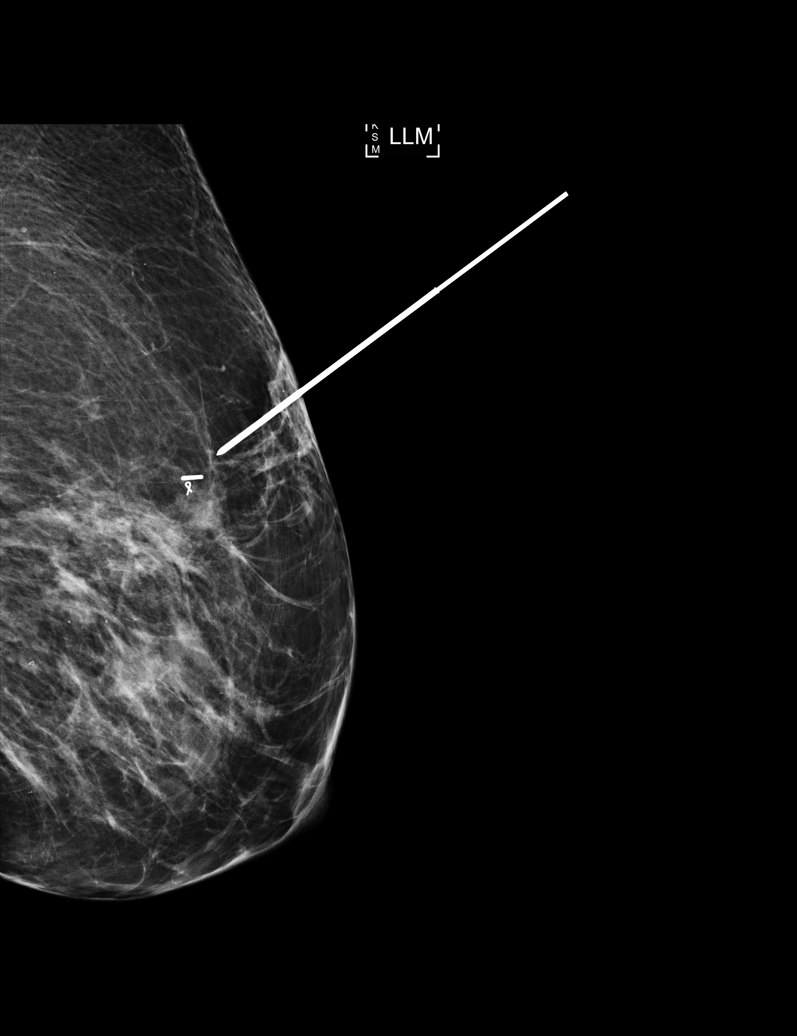

[L LM (3 of 3)]
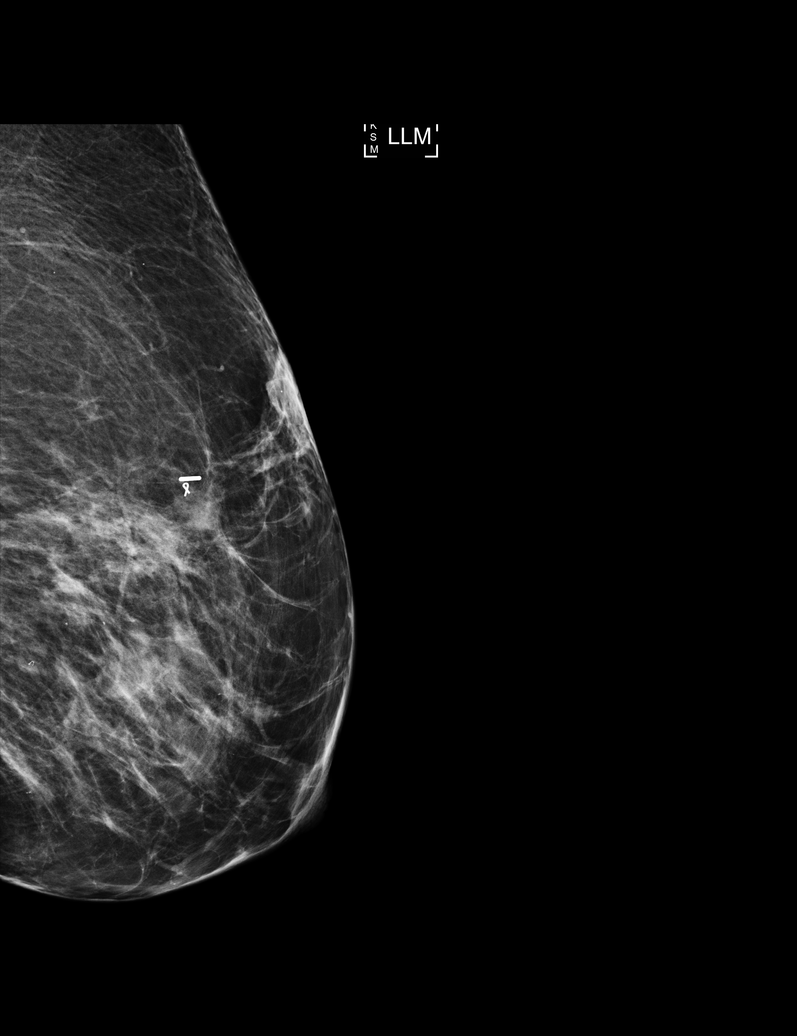

[6 of 6 positions shown; findings below may reference images not displayed]

FINDINGS: Patient presents for radioactive seed localization prior to left
lumpectomy. I met with the patient and we discussed the procedure of
seed localization including benefits and alternatives. We discussed
the high likelihood of a successful procedure. We discussed the
risks of the procedure including infection, bleeding, tissue injury
and further surgery. We discussed the low dose of radioactivity
involved in the procedure. Informed, written consent was given.

The usual time-out protocol was performed immediately prior to the
procedure.

Using mammographic guidance, sterile technique, 1% lidocaine and an
D-[id number] radioactive seed, the ribbon shaped biopsy marker in the
upper, outer left breast was localized using a superior to inferior
approach. The follow-up mammogram images confirm the seed in the
expected location and were marked for Dr. Polk.

Follow-up survey of the patient confirms presence of the radioactive
seed.

Order number of D-ZL3 seed:  [id number].

Total activity:  0.249 mCi  reference Date: September 27, 2016

The patient tolerated the procedure well and was released from the
[REDACTED]. She was given instructions regarding seed removal.
IMPRESSION: Radioactive seed localization of the left breast. No apparent
complications.

## 2017-11-28 ENCOUNTER — Other Ambulatory Visit: Payer: Self-pay | Admitting: Nurse Practitioner

## 2017-11-28 DIAGNOSIS — I10 Essential (primary) hypertension: Secondary | ICD-10-CM

## 2017-12-09 ENCOUNTER — Encounter: Payer: Self-pay | Admitting: Nurse Practitioner

## 2017-12-09 ENCOUNTER — Ambulatory Visit: Payer: Medicare Other | Admitting: Nurse Practitioner

## 2017-12-09 VITALS — BP 128/64 | HR 48 | Temp 97.2°F | Ht 61.0 in | Wt 183.0 lb

## 2017-12-09 DIAGNOSIS — K219 Gastro-esophageal reflux disease without esophagitis: Secondary | ICD-10-CM | POA: Diagnosis not present

## 2017-12-09 DIAGNOSIS — K449 Diaphragmatic hernia without obstruction or gangrene: Secondary | ICD-10-CM

## 2017-12-09 DIAGNOSIS — I10 Essential (primary) hypertension: Secondary | ICD-10-CM | POA: Diagnosis not present

## 2017-12-09 DIAGNOSIS — K5730 Diverticulosis of large intestine without perforation or abscess without bleeding: Secondary | ICD-10-CM

## 2017-12-09 DIAGNOSIS — D509 Iron deficiency anemia, unspecified: Secondary | ICD-10-CM

## 2017-12-09 DIAGNOSIS — R609 Edema, unspecified: Secondary | ICD-10-CM

## 2017-12-09 DIAGNOSIS — R001 Bradycardia, unspecified: Secondary | ICD-10-CM

## 2017-12-09 DIAGNOSIS — M1009 Idiopathic gout, multiple sites: Secondary | ICD-10-CM

## 2017-12-09 DIAGNOSIS — M810 Age-related osteoporosis without current pathological fracture: Secondary | ICD-10-CM

## 2017-12-09 DIAGNOSIS — N183 Chronic kidney disease, stage 3 unspecified: Secondary | ICD-10-CM

## 2017-12-09 DIAGNOSIS — Z6832 Body mass index (BMI) 32.0-32.9, adult: Secondary | ICD-10-CM

## 2017-12-09 DIAGNOSIS — F411 Generalized anxiety disorder: Secondary | ICD-10-CM

## 2017-12-09 DIAGNOSIS — E034 Atrophy of thyroid (acquired): Secondary | ICD-10-CM

## 2017-12-09 MED ORDER — OLMESARTAN MEDOXOMIL 40 MG PO TABS: 40.0000 mg | ORAL_TABLET | Freq: Every day | ORAL | 1 refills | Status: DC

## 2017-12-09 MED ORDER — BUSPIRONE HCL 15 MG PO TABS: 15.0000 mg | ORAL_TABLET | Freq: Three times a day (TID) | ORAL | 2 refills | Status: DC

## 2017-12-09 MED ORDER — OMEPRAZOLE 20 MG PO CPDR
20.0000 mg | DELAYED_RELEASE_CAPSULE | Freq: Every day | ORAL | 1 refills | Status: DC
Start: 2017-12-09 — End: 2018-03-23

## 2017-12-09 MED ORDER — LEVOTHYROXINE SODIUM 75 MCG PO TABS: 75.0000 ug | ORAL_TABLET | Freq: Every day | ORAL | 2 refills | Status: DC

## 2017-12-09 MED ORDER — FUROSEMIDE 20 MG PO TABS: 20.0000 mg | ORAL_TABLET | Freq: Every day | ORAL | 3 refills | Status: DC

## 2017-12-09 NOTE — Patient Instructions (Signed)
Bradycardia, Adult °Bradycardia is a slower-than-normal heartbeat. A normal resting heart rate for an adult ranges from 60 to 100 beats per minute. With bradycardia, the resting heart rate is less than 60 beats per minute. °Bradycardia can prevent enough oxygen from reaching certain areas of your body when you are active. It can be serious if it keeps enough oxygen from reaching your brain and other parts of your body. Bradycardia is not a problem for everyone. For some healthy adults, a slow resting heart rate is normal. °What are the causes? °This condition may be caused by: °· A problem with the heart, including: °? A problem with the heart's electrical system, such as a heart block. °? A problem with the heart's natural pacemaker (sinus node). °? Heart disease. °? A heart attack. °? Heart damage. °? A heart infection. °? A heart condition that is present at birth (congenital heart defect). °· Certain medicines that treat heart conditions. °· Certain conditions, such as hypothyroidism and obstructive sleep apnea. °· Problems with the balance of chemicals and other substances, like potassium, in the blood. ° °What increases the risk? °This condition is more likely to develop in adults who: °· Are age 65 or older. °· Have high blood pressure (hypertension), high cholesterol (hyperlipidemia), or diabetes. °· Drink heavily, use tobacco or nicotine products, or use drugs. °· Are stressed. ° °What are the signs or symptoms? °Symptoms of this condition include: °· Light-headedness. °· Feeling faint or fainting. °· Fatigue and weakness. °· Shortness of breath. °· Chest pain (angina). °· Drowsiness. °· Confusion. °· Dizziness. ° °How is this diagnosed? °This condition may be diagnosed based on: °· Your symptoms. °· Your medical history. °· A physical exam. ° °During the exam, your health care provider will listen to your heartbeat and check your pulse. To confirm the diagnosis, your health care provider may order tests,  such as: °· Blood tests. °· An electrocardiogram (ECG). This test records the heart's electrical activity. The test can show how fast your heart is beating and whether the heartbeat is steady. °· A test in which you wear a portable device (event recorder or Holter monitor) to record your heart's electrical activity while you go about your day. °· An exercise test. ° °How is this treated? °Treatment for this condition depends on the cause of the condition and how severe your symptoms are. Treatment may involve: °· Treatment of the underlying condition. °· Changing your medicines or how much medicine you take. °· Having a small, battery-operated device called a pacemaker implanted under the skin. When bradycardia occurs, this device can be used to increase your heart rate and help your heart to beat in a regular rhythm. ° °Follow these instructions at home: °Lifestyle ° °· Manage any health conditions that contribute to bradycardia as told by your health care provider. °· Follow a heart-healthy diet. A nutrition specialist (dietitian) can help to educate you about healthy food options and changes. °· Follow an exercise program that is approved by your health care provider. °· Maintain a healthy weight. °· Try to reduce or manage your stress, such as with yoga or meditation. If you need help reducing stress, ask your health care provider. °· Do not use use any products that contain nicotine or tobacco, such as cigarettes and e-cigarettes. If you need help quitting, ask your health care provider. °· Do not use illegal drugs. °· Limit alcohol intake to no more than 1 drink per day for nonpregnant women and 2 drinks per   day for men. One drink equals 12 oz of beer, 5 oz of wine, or 1½ oz of hard liquor. °General instructions °· Take over-the-counter and prescription medicines only as told by your health care provider. °· Keep all follow-up visits as directed by your health care provider. This is important. °How is this  prevented? °In some cases, bradycardia may be prevented by: °· Treating underlying medical problems. °· Stopping behaviors or medicines that can trigger the condition. ° °Contact a health care provider if: °· You feel light-headed or dizzy. °· You almost faint. °· You feel weak or are easily fatigued during physical activity. °· You experience confusion or have memory problems. °Get help right away if: °· You faint. °· You have an irregular heartbeat (palpitations). °· You have chest pain. °· You have trouble breathing. °This information is not intended to replace advice given to you by your health care provider. Make sure you discuss any questions you have with your health care provider. °Document Released: 01/16/2002 Document Revised: 12/23/2015 Document Reviewed: 10/16/2015 °[employer] Interactive Patient Education © 2017 [employer] ° °

## 2017-12-09 NOTE — Progress Notes (Addendum)
Subjective:    Patient ID: Darlene Gregory, female    DOB: May 06, 1934, 84 y.o.   MRN: [id number]   Chief Complaint: Medical Management of Chronic Issues  HPI:  1. Essential hypertension, benign  No c/o chest pain ,SOB or headache. Does not check blood pressure at home. BP Readings from Last 3 Encounters:  10/17/17 (!) 154/64  09/26/17 (!) 159/52  08/23/17 116/68     2. Diaphragmatic hernia without obstruction and without gangrene  Only has symptoms when she over eats.  3. Gastroesophageal reflux disease without esophagitis  Has to take prilosec daily to prevent having symptoms.  4. Diverticulosis of colon  No recent flare ups  5. Hypothyroidism due to acquired atrophy of thyroid  Having no problems that she is aware of.   6. Age-related osteoporosis without current pathological fracture Last BMD test was done on  04/20/13 with tscore of -2.0. She is not going to do anymore of these due to her age. She is unable to do much weight bearing exercises due  To her arthritic knees.  7. CKD (chronic kidney disease), stage III (HCC)  We are currently just watching labs. She deneis any problems with urination  8. Iron deficiency anemia, unspecified iron deficiency anemia type  Last hgb was 10.7. She takes daily iron supplement  9. Acute idiopathic gout of multiple sites  denies any recent flare ups  10. GAD (generalized anxiety disorder)  She use to be on celexa but stopped taking it in the spring. Says she is doing well without it. She said she did not see where it really helped her. She has episodes of anxiety and would like something just to take as needed. GAD 7 : Generalized Anxiety Score 12/09/2017  Nervous, Anxious, on Edge 0  Control/stop worrying 0  Worry too much - different things 0  Trouble relaxing 0  Restless 0  Easily annoyed or irritable 0  Afraid - awful might happen 0  Total GAD 7 Score 0  Anxiety Difficulty Not difficult at all      11. BMI 32.0-32.9,adult  No  recent weight change    Outpatient Encounter Medications as of 12/09/2017  Medication Sig  . aspirin EC 81 MG tablet Take 1 tablet (81 mg total) by mouth daily.  . Cholecalciferol (VITAMIN D) 2000 UNITS CAPS Take by mouth daily.  . citalopram (CELEXA) 10 MG tablet Take 1 tablet (10 mg total) by mouth daily. (Patient not taking: Reported on 10/17/2017)  . cloNIDine (CATAPRES) 0.1 MG tablet TAKE 1 TABLET BY MOUTH TWICE DAILY  . FIBER PO Take 2 each by mouth daily.   . fish oil-omega-3 fatty acids 1000 MG capsule Take 1 g by mouth daily.  Marland Kitchen levothyroxine (SYNTHROID, LEVOTHROID) 75 MCG tablet Take 1 tablet (75 mcg total) by mouth daily.  . Multiple Vitamins-Minerals (ICAPS PO) Take 1 tablet by mouth daily.   Marland Kitchen olmesartan-hydrochlorothiazide (BENICAR HCT) 40-25 MG tablet TAKE 1 TABLET BY MOUTH ONCE DAILY  . omeprazole (PRILOSEC) 20 MG capsule Take 1 capsule (20 mg total) by mouth daily.  Marland Kitchen omeprazole (PRILOSEC) 20 MG capsule TAKE 1 CAPSULE BY MOUTH ONCE DAILY  . tamoxifen (NOLVADEX) 20 MG tablet Take 20 mg by mouth daily.  . vitamin C (ASCORBIC ACID) 500 MG tablet Take 500 mg by mouth daily.      New complaints: None today  Social history: husband still living  Review of Systems  Constitutional: Negative for activity change and appetite change.  HENT: Negative.  Eyes: Negative for pain.  Respiratory: Negative for shortness of breath.   Cardiovascular: Negative for chest pain, palpitations and leg swelling.  Gastrointestinal: Negative for abdominal pain.  Endocrine: Negative for polydipsia.  Genitourinary: Negative.   Skin: Negative for rash.  Neurological: Negative for dizziness, weakness and headaches.  Hematological: Does not bruise/bleed easily.  Psychiatric/Behavioral: Negative.   All other systems reviewed and are negative.      Objective:   Physical Exam  Constitutional: She is oriented to person, place, and time. She appears well-developed and well-nourished.  HENT:    Head: Normocephalic.  Nose: Nose normal.  Mouth/Throat: Oropharynx is clear and moist.  Eyes: Pupils are equal, round, and reactive to light. EOM are normal.  Neck: Normal range of motion. Neck supple. No JVD present. Carotid bruit is not present.  Cardiovascular: Normal rate, regular rhythm, normal heart sounds and intact distal pulses.  Sinus bradycardia  Pulmonary/Chest: Effort normal and breath sounds normal. No respiratory distress. She has no wheezes. She has no rales. She exhibits no tenderness.  Abdominal: Soft. Normal appearance, normal aorta and bowel sounds are normal. She exhibits no distension, no abdominal bruit, no pulsatile midline mass and no mass. There is no splenomegaly or hepatomegaly. There is no tenderness.  Musculoskeletal: Normal range of motion. She exhibits edema (2+ edema).  Lymphadenopathy:    She has no cervical adenopathy.  Neurological: She is alert and oriented to person, place, and time. She has normal reflexes.  Skin: Skin is warm and dry.  Psychiatric: She has a normal mood and affect. Her behavior is normal. Judgment and thought content normal.  Nursing note and vitals reviewed.  BP 128/64   Pulse (!) 48   Temp (!) 97.2 F (36.2 C) (Oral)   Ht '5\' 1"'$  (1.549 m)   Wt 183 lb (83 kg)   BMI 34.58 kg/m   EKG- sinus bradycardia    Assessment & Plan:  Darlene Gregory comes in today with chief complaint of Medical Management of Chronic Issues   Diagnosis and orders addressed:  1. Essential hypertension, benign Low sodium diet Stop benicar hctz - changed to benicar '40mg'$  daily - olmesartan (BENICAR) 40 MG tablet; Take 1 tablet (40 mg total) by mouth daily.  Dispense: 90 tablet; Refill: 1 - CMP14+EGFR - Lipid panel  2. Diaphragmatic hernia without obstruction and without gangrene Avoid over eating  3. Gastroesophageal reflux disease without esophagitis Avoid spicy foods Do not eat 2 hours prior to bedtime - omeprazole (PRILOSEC) 20 MG capsule;  Take 1 capsule (20 mg total) by mouth daily.  Dispense: 90 capsule; Refill: 1  4. Diverticulosis of colon Avoid foods with seeds or undigestibles skin  5. Hypothyroidism due to acquired atrophy of thyroid - levothyroxine (SYNTHROID, LEVOTHROID) 75 MCG tablet; Take 1 tablet (75 mcg total) by mouth daily.  Dispense: 90 tablet; Refill: 2  6. Age-related osteoporosis without current pathological fracture Weight bearing exercise as can tolerate  7. CKD (chronic kidney disease), stage III ([place]) Las pending  8. Iron deficiency anemia, unspecified iron deficiency anemia type Labs  pending - Anemia Profile B  9. Acute idiopathic gout of multiple sites  10. GAD (generalized anxiety disorder) Stress management -buspar 15 mg 1 po prn #30 2 refills-  11. BMI 32.0-32.9,adult Discussed diet and exercise for person with BMI >25 Will recheck weight in 3-6 months  12. Peripheral edema elevate legs when sitting Added lasix to med today - furosemide (LASIX) 20 MG tablet; Take 1 tablet (20 mg  total) by mouth daily.  Dispense: 30 tablet; Refill: 3  13.  Sinus bradycardia - if develops dizziness or has any syncopial episodes- need to go to er  Labs pending Health Maintenance reviewed Diet and exercise encouraged  Follow up plan: 3 months   [place], FNP

## 2017-12-10 LAB — ANEMIA PROFILE B
BASOS ABS: 0 10*3/uL (ref 0.0–0.2)
BASOS: 1 %
EOS (ABSOLUTE): 0.3 10*3/uL (ref 0.0–0.4)
Eos: 5 %
FERRITIN: 74 ng/mL (ref 15–150)
Folate: >20 ng/mL (ref >3.0)
HEMATOCRIT: 33 % — AB (ref 34.0–46.6)
Hemoglobin: 10.9 g/dL — ABNORMAL LOW (ref 11.1–15.9)
IMMATURE GRANS (ABS): 0 10*3/uL (ref 0.0–0.1)
IMMATURE GRANULOCYTES: 0 %
Iron Saturation: 37 % (ref 15–55)
Iron: 89 ug/dL (ref 27–139)
LYMPHS: 28 %
Lymphocytes Absolute: 1.7 10*3/uL (ref 0.7–3.1)
MCH: 32.1 pg (ref 26.6–33.0)
MCHC: 33 g/dL (ref 31.5–35.7)
MCV: 97 fL (ref 79–97)
MONOCYTES: 9 %
Monocytes Absolute: 0.5 10*3/uL (ref 0.1–0.9)
NEUTROS PCT: 57 %
Neutrophils Absolute: 3.3 10*3/uL (ref 1.4–7.0)
Platelets: 217 10*3/uL (ref 150–450)
RBC: 3.4 x10E6/uL — AB (ref 3.77–5.28)
RDW: 13.4 % (ref 12.3–15.4)
RETIC CT PCT: 1.1 % (ref 0.6–2.6)
Total Iron Binding Capacity: 243 ug/dL — ABNORMAL LOW (ref 250–450)
UIBC: 154 ug/dL (ref 118–369)
Vitamin B-12: 658 pg/mL (ref 232–1245)
WBC: 5.8 10*3/uL (ref 3.4–10.8)

## 2017-12-10 LAB — CMP14+EGFR
ALBUMIN: 3.7 g/dL (ref 3.5–4.7)
ALT: 8 IU/L (ref 0–32)
AST: 10 IU/L (ref 0–40)
Albumin/Globulin Ratio: 1.6 (ref 1.2–2.2)
Alkaline Phosphatase: 60 IU/L (ref 39–117)
BUN/Creatinine Ratio: 14 (ref 12–28)
BUN: 23 mg/dL (ref 8–27)
Bilirubin Total: 0.3 mg/dL (ref 0.0–1.2)
CALCIUM: 9.4 mg/dL (ref 8.7–10.3)
CO2: 26 mmol/L (ref 20–29)
CREATININE: 1.59 mg/dL — AB (ref 0.57–1.00)
Chloride: 101 mmol/L (ref 96–106)
GFR calc Af Amer: 35 mL/min/{1.73_m2} — ABNORMAL LOW (ref >59)
GFR, EST NON AFRICAN AMERICAN: 30 mL/min/{1.73_m2} — AB (ref >59)
GLUCOSE: 98 mg/dL (ref 65–99)
Globulin, Total: 2.3 g/dL (ref 1.5–4.5)
Potassium: 4.1 mmol/L (ref 3.5–5.2)
SODIUM: 142 mmol/L (ref 134–144)
Total Protein: 6 g/dL (ref 6.0–8.5)

## 2017-12-10 LAB — LIPID PANEL
CHOL/HDL RATIO: 2 ratio (ref 0.0–4.4)
Cholesterol, Total: 131 mg/dL (ref 100–199)
HDL: 64 mg/dL (ref >39)
LDL CALC: 55 mg/dL (ref 0–99)
TRIGLYCERIDES: 59 mg/dL (ref 0–149)
VLDL CHOLESTEROL CAL: 12 mg/dL (ref 5–40)

## 2017-12-10 LAB — THYROID PANEL WITH TSH
Free Thyroxine Index: 2.9 (ref 1.2–4.9)
T3 UPTAKE RATIO: 28 % (ref 24–39)
T4 TOTAL: 10.5 ug/dL (ref 4.5–12.0)
TSH: 4.31 u[IU]/mL (ref 0.450–4.500)

## 2018-02-16 ENCOUNTER — Other Ambulatory Visit: Payer: Self-pay | Admitting: Oncology

## 2018-02-27 ENCOUNTER — Telehealth: Payer: Self-pay | Admitting: Oncology

## 2018-02-27 NOTE — Telephone Encounter (Signed)
Patient called to cancel. She did not need the November appointment.

## 2018-03-06 ENCOUNTER — Other Ambulatory Visit: Payer: Self-pay | Admitting: Nurse Practitioner

## 2018-03-06 DIAGNOSIS — R609 Edema, unspecified: Secondary | ICD-10-CM

## 2018-03-14 ENCOUNTER — Ambulatory Visit: Payer: Medicare Other | Admitting: Oncology

## 2018-03-14 ENCOUNTER — Other Ambulatory Visit: Payer: Medicare Other

## 2018-03-22 ENCOUNTER — Ambulatory Visit: Payer: Medicare Other | Admitting: Adult Health

## 2018-03-22 ENCOUNTER — Other Ambulatory Visit: Payer: Medicare Other

## 2018-03-23 ENCOUNTER — Encounter: Payer: Self-pay | Admitting: Nurse Practitioner

## 2018-03-23 ENCOUNTER — Ambulatory Visit: Payer: Medicare Other | Admitting: Nurse Practitioner

## 2018-03-23 VITALS — BP 143/71 | HR 53 | Temp 96.9°F | Ht 61.0 in | Wt 183.0 lb

## 2018-03-23 DIAGNOSIS — N183 Chronic kidney disease, stage 3 unspecified: Secondary | ICD-10-CM

## 2018-03-23 DIAGNOSIS — K219 Gastro-esophageal reflux disease without esophagitis: Secondary | ICD-10-CM | POA: Diagnosis not present

## 2018-03-23 DIAGNOSIS — E034 Atrophy of thyroid (acquired): Secondary | ICD-10-CM

## 2018-03-23 DIAGNOSIS — M1009 Idiopathic gout, multiple sites: Secondary | ICD-10-CM

## 2018-03-23 DIAGNOSIS — M810 Age-related osteoporosis without current pathological fracture: Secondary | ICD-10-CM

## 2018-03-23 DIAGNOSIS — I10 Essential (primary) hypertension: Secondary | ICD-10-CM

## 2018-03-23 DIAGNOSIS — Z6832 Body mass index (BMI) 32.0-32.9, adult: Secondary | ICD-10-CM

## 2018-03-23 DIAGNOSIS — F411 Generalized anxiety disorder: Secondary | ICD-10-CM

## 2018-03-23 DIAGNOSIS — R600 Localized edema: Secondary | ICD-10-CM

## 2018-03-23 DIAGNOSIS — D509 Iron deficiency anemia, unspecified: Secondary | ICD-10-CM

## 2018-03-23 DIAGNOSIS — R609 Edema, unspecified: Secondary | ICD-10-CM

## 2018-03-23 MED ORDER — LEVOTHYROXINE SODIUM 75 MCG PO TABS: 75.0000 ug | ORAL_TABLET | Freq: Every day | ORAL | 2 refills | Status: DC

## 2018-03-23 MED ORDER — FUROSEMIDE 20 MG PO TABS: 20.0000 mg | ORAL_TABLET | Freq: Every day | ORAL | 1 refills | Status: DC

## 2018-03-23 MED ORDER — LORATADINE 10 MG PO TABS: 10.0000 mg | ORAL_TABLET | Freq: Every day | ORAL | 11 refills | Status: DC

## 2018-03-23 MED ORDER — HEMOCYTE PLUS 106-1 MG PO CAPS: 1.0000 | ORAL_CAPSULE | Freq: Every day | ORAL | 1 refills | Status: DC

## 2018-03-23 MED ORDER — OMEPRAZOLE 20 MG PO CPDR: 20.0000 mg | DELAYED_RELEASE_CAPSULE | Freq: Every day | ORAL | 1 refills | Status: DC

## 2018-03-23 MED ORDER — OLMESARTAN MEDOXOMIL 40 MG PO TABS: 40.0000 mg | ORAL_TABLET | Freq: Every day | ORAL | 1 refills | Status: DC

## 2018-03-23 MED ORDER — HYDROCODONE-HOMATROPINE 5-1.5 MG/5ML PO SYRP: 5.0000 mL | ORAL_SOLUTION | Freq: Four times a day (QID) | ORAL | 0 refills | Status: DC | PRN

## 2018-03-23 MED ORDER — CLONIDINE HCL 0.1 MG PO TABS: 0.1000 mg | ORAL_TABLET | Freq: Two times a day (BID) | ORAL | 1 refills | Status: DC

## 2018-03-23 NOTE — Addendum Note (Signed)
Addended by: Chevis Pretty on: 03/23/2018 01:48 PM   Modules accepted: Orders

## 2018-03-23 NOTE — Patient Instructions (Signed)

## 2018-03-23 NOTE — Progress Notes (Signed)
Subjective:    Patient ID: Darlene Gregory, female    DOB: [birth year], 84 y.o.   MRN: [id number]   Chief Complaint: Medical Management of Chronic Issues   HPI:  1. Essential hypertension, benign No c/o chest pain, sob or headache. Does not blood pressure  BP Readings from Last 3 Encounters:  03/23/18 (!) 143/71  12/09/17 128/64  10/17/17 (!) 154/64     2. Gastroesophageal reflux disease without esophagitis  Patient is on omeprazole daily and she worries about it affecting her kidney function  3. Hypothyroidism due to acquired atrophy of thyroid  No problems that aware of.  4. Age-related osteoporosis without current pathological fracture  has not had in awhile. She refuses to do today  5. CKD (chronic kidney disease), stage III (HCC)  Last creatine was 1.59 with gfr of 30  6. Acute idiopathic gout of multiple sites No recent flare ups   7. Iron deficiency anemia, unspecified iron deficiency anemia type  last hgb was 10.9- does not like iron supplements because causes constipation.  8. BMI 32.0-32.9,adult  No recent weight changes  9. GAD (generalized anxiety disorder)  She currently is not taking anything. Say sshe  Is doing well.    Outpatient Encounter Medications as of 03/23/2018  Medication Sig  . aspirin EC 81 MG tablet Take 1 tablet (81 mg total) by mouth daily.  . Cholecalciferol (VITAMIN D) 2000 UNITS CAPS Take by mouth daily.  . cloNIDine (CATAPRES) 0.1 MG tablet TAKE 1 TABLET BY MOUTH TWICE DAILY  . FIBER PO Take 2 each by mouth daily.   . fish oil-omega-3 fatty acids 1000 MG capsule Take 1 g by mouth daily.  . furosemide (LASIX) 20 MG tablet TAKE 1 TABLET BY MOUTH ONCE DAILY  . levothyroxine (SYNTHROID, LEVOTHROID) 75 MCG tablet Take 1 tablet (75 mcg total) by mouth daily.  . Multiple Vitamins-Minerals (ICAPS PO) Take 1 tablet by mouth daily.   Marland Kitchen olmesartan (BENICAR) 40 MG tablet Take 1 tablet (40 mg total) by mouth daily.  Marland Kitchen omeprazole (PRILOSEC) 20 MG  capsule Take 1 capsule (20 mg total) by mouth daily.  . tamoxifen (NOLVADEX) 20 MG tablet TAKE 1 TABLET BY MOUTH ONCE DAILY  . vitamin C (ASCORBIC ACID) 500 MG tablet Take 500 mg by mouth daily.  . busPIRone (BUSPAR) 15 MG tablet Take 1 tablet (15 mg total) by mouth 3 (three) times daily. (Patient not taking: Reported on 03/23/2018)       New complaints: Wants hycodan to have on hand in refrigerator. She does not like to come during flu season  Social history: Lives with her husband   Review of Systems  Constitutional: Negative for activity change and appetite change.  HENT: Negative.   Eyes: Negative for pain.  Respiratory: Negative for shortness of breath.   Cardiovascular: Negative for chest pain, palpitations and leg swelling.  Gastrointestinal: Negative for abdominal pain.  Endocrine: Negative for polydipsia.  Genitourinary: Negative.   Skin: Negative for rash.  Neurological: Negative for dizziness, weakness and headaches.  Hematological: Does not bruise/bleed easily.  Psychiatric/Behavioral: Negative.   All other systems reviewed and are negative.      Objective:   Physical Exam  Constitutional: She is oriented to person, place, and time. She appears well-developed and well-nourished. No distress.  HENT:  Head: Normocephalic.  Nose: Nose normal.  Mouth/Throat: Oropharynx is clear and moist.  Eyes: Pupils are equal, round, and reactive to light. EOM are normal.  Neck: Normal range of  motion. Neck supple. No JVD present. Carotid bruit is not present.  Cardiovascular: Normal rate, regular rhythm, normal heart sounds and intact distal pulses.  Pulmonary/Chest: Effort normal and breath sounds normal. No respiratory distress. She has no wheezes. She has no rales. She exhibits no tenderness.  Abdominal: Soft. Normal appearance, normal aorta and bowel sounds are normal. She exhibits no distension, no abdominal bruit, no pulsatile midline mass and no mass. There is no  splenomegaly or hepatomegaly. There is no tenderness.  Musculoskeletal: Normal range of motion. She exhibits edema (1+ edema bil ankles).  Lymphadenopathy:    She has no cervical adenopathy.  Neurological: She is alert and oriented to person, place, and time. She has normal reflexes.  Skin: Skin is warm and dry.  Psychiatric: She has a normal mood and affect. Her behavior is normal. Judgment and thought content normal.  Nursing note and vitals reviewed.   BP (!) 143/71   Pulse (!) 53   Temp (!) 96.9 F (36.1 C) (Oral)   Ht '5\' 1"'$  (1.549 m)   Wt 183 lb (83 kg)   BMI 34.58 kg/m        Assessment & Plan:  Darlene Gregory comes in today with chief complaint of Medical Management of Chronic Issues   Diagnosis and orders addressed:  1. Essential hypertension, benign Low sodium diet - olmesartan (BENICAR) 40 MG tablet; Take 1 tablet (40 mg total) by mouth daily.  Dispense: 90 tablet; Refill: 1 - cloNIDine (CATAPRES) 0.1 MG tablet; Take 1 tablet (0.1 mg total) by mouth 2 (two) times daily.  Dispense: 180 tablet; Refill: 1 - CMP14+EGFR - Lipid panel  2. Gastroesophageal reflux disease without esophagitis Avoid spicy foods Do not eat 2 hours prior to bedtime - omeprazole (PRILOSEC) 20 MG capsule; Take 1 capsule (20 mg total) by mouth daily.  Dispense: 90 capsule; Refill: 1  3. Hypothyroidism due to acquired atrophy of thyroid - levothyroxine (SYNTHROID, LEVOTHROID) 75 MCG tablet; Take 1 tablet (75 mcg total) by mouth daily.  Dispense: 90 tablet; Refill: 2 - Thyroid Panel With TSH  4. Age-related osteoporosis without current pathological fracture Refuses dexascan today - says will do next year  5. CKD (chronic kidney disease), stage III ([place]) Labs pending  6. Acute idiopathic gout of multiple sites Low purine diet  7. Iron deficiency anemia, unspecified iron deficiency anemia type Will send in rx for iron supplement - CBC with Differential/Platelet  8. BMI  32.0-32.9,adult Discussed diet and exercise for person with BMI >25 Will recheck weight in 3-6 months  9. GAD (generalized anxiety disorder) Stress management  10. Peripheral edema Elevate legs when sitting - furosemide (LASIX) 20 MG tablet; Take 1 tablet (20 mg total) by mouth daily.  Dispense: 90 tablet; Refill: 1   Labs pending Health Maintenance reviewed Diet and exercise encouraged  Follow up plan: 6 months   [name], FNP

## 2018-03-24 LAB — CMP14+EGFR
ALBUMIN: 3.9 g/dL (ref 3.5–4.7)
ALT: 12 IU/L (ref 0–32)
AST: 13 IU/L (ref 0–40)
Albumin/Globulin Ratio: 1.7 (ref 1.2–2.2)
Alkaline Phosphatase: 75 IU/L (ref 39–117)
BUN / CREAT RATIO: 15 (ref 12–28)
BUN: 26 mg/dL (ref 8–27)
Bilirubin Total: 0.2 mg/dL (ref 0.0–1.2)
CALCIUM: 9.1 mg/dL (ref 8.7–10.3)
CO2: 24 mmol/L (ref 20–29)
Chloride: 105 mmol/L (ref 96–106)
Creatinine, Ser: 1.75 mg/dL — ABNORMAL HIGH (ref 0.57–1.00)
GFR calc Af Amer: 31 mL/min/{1.73_m2} — ABNORMAL LOW (ref >59)
GFR, EST NON AFRICAN AMERICAN: 27 mL/min/{1.73_m2} — AB (ref >59)
GLOBULIN, TOTAL: 2.3 g/dL (ref 1.5–4.5)
Glucose: 102 mg/dL — ABNORMAL HIGH (ref 65–99)
Potassium: 4.4 mmol/L (ref 3.5–5.2)
SODIUM: 144 mmol/L (ref 134–144)
Total Protein: 6.2 g/dL (ref 6.0–8.5)

## 2018-03-24 LAB — CBC WITH DIFFERENTIAL/PLATELET
Basophils Absolute: 0.1 10*3/uL (ref 0.0–0.2)
Basos: 1 %
EOS (ABSOLUTE): 0.3 10*3/uL (ref 0.0–0.4)
EOS: 5 %
HEMATOCRIT: 34.7 % (ref 34.0–46.6)
Hemoglobin: 11.3 g/dL (ref 11.1–15.9)
Immature Grans (Abs): 0 10*3/uL (ref 0.0–0.1)
Immature Granulocytes: 0 %
Lymphocytes Absolute: 1.5 10*3/uL (ref 0.7–3.1)
Lymphs: 23 %
MCH: 31 pg (ref 26.6–33.0)
MCHC: 32.6 g/dL (ref 31.5–35.7)
MCV: 95 fL (ref 79–97)
MONOS ABS: 0.5 10*3/uL (ref 0.1–0.9)
Monocytes: 8 %
Neutrophils Absolute: 4.1 10*3/uL (ref 1.4–7.0)
Neutrophils: 63 %
PLATELETS: 173 10*3/uL (ref 150–450)
RBC: 3.65 x10E6/uL — AB (ref 3.77–5.28)
RDW: 12 % — AB (ref 12.3–15.4)
WBC: 6.5 10*3/uL (ref 3.4–10.8)

## 2018-03-24 LAB — THYROID PANEL WITH TSH
Free Thyroxine Index: 2.9 (ref 1.2–4.9)
T3 Uptake Ratio: 29 % (ref 24–39)
T4 TOTAL: 10.1 ug/dL (ref 4.5–12.0)
TSH: 5.76 u[IU]/mL — ABNORMAL HIGH (ref 0.450–4.500)

## 2018-03-24 LAB — LIPID PANEL
Chol/HDL Ratio: 2.2 ratio (ref 0.0–4.4)
Cholesterol, Total: 148 mg/dL (ref 100–199)
HDL: 67 mg/dL (ref >39)
LDL Calculated: 67 mg/dL (ref 0–99)
TRIGLYCERIDES: 70 mg/dL (ref 0–149)
VLDL CHOLESTEROL CAL: 14 mg/dL (ref 5–40)

## 2018-03-27 ENCOUNTER — Other Ambulatory Visit: Payer: Self-pay

## 2018-03-27 DIAGNOSIS — E034 Atrophy of thyroid (acquired): Secondary | ICD-10-CM

## 2018-03-27 MED ORDER — PANTOPRAZOLE SODIUM 40 MG PO TBEC: 40.0000 mg | DELAYED_RELEASE_TABLET | Freq: Every day | ORAL | 3 refills | Status: DC

## 2018-03-27 MED ORDER — LEVOTHYROXINE SODIUM 88 MCG PO TABS: 88.0000 ug | ORAL_TABLET | Freq: Every day | ORAL | 1 refills | Status: DC

## 2018-03-27 NOTE — Addendum Note (Signed)
Addended by: Chevis Pretty on: 03/27/2018 01:21 PM   Modules accepted: Orders

## 2018-03-27 NOTE — Addendum Note (Signed)
Addended by: Chevis Pretty on: 03/27/2018 06:28 PM   Modules accepted: Orders

## 2018-03-31 ENCOUNTER — Telehealth: Payer: Self-pay | Admitting: Nurse Practitioner

## 2018-03-31 NOTE — Telephone Encounter (Signed)
All notes and past RX say 40 mg === pt aware

## 2018-04-29 ENCOUNTER — Encounter: Payer: Self-pay | Admitting: Gastroenterology

## 2018-08-12 ENCOUNTER — Other Ambulatory Visit: Payer: Self-pay | Admitting: Nurse Practitioner

## 2018-09-21 ENCOUNTER — Ambulatory Visit: Payer: Medicare Other | Admitting: Nurse Practitioner

## 2018-10-10 ENCOUNTER — Other Ambulatory Visit: Payer: Self-pay | Admitting: *Deleted

## 2018-10-10 DIAGNOSIS — I10 Essential (primary) hypertension: Secondary | ICD-10-CM

## 2018-10-10 MED ORDER — OLMESARTAN MEDOXOMIL 40 MG PO TABS: 40.0000 mg | ORAL_TABLET | Freq: Every day | ORAL | 0 refills | Status: DC

## 2018-10-26 ENCOUNTER — Telehealth: Payer: Self-pay | Admitting: Nurse Practitioner

## 2018-10-26 NOTE — Telephone Encounter (Signed)
Patient states that she fell on her knee last night and is now really bruised would like to come in to have xray.  appt made

## 2018-10-27 ENCOUNTER — Encounter: Payer: Self-pay | Admitting: Family Medicine

## 2018-10-27 ENCOUNTER — Other Ambulatory Visit: Payer: Self-pay

## 2018-10-27 ENCOUNTER — Ambulatory Visit (INDEPENDENT_AMBULATORY_CARE_PROVIDER_SITE_OTHER): Payer: Medicare Other | Admitting: Family Medicine

## 2018-10-27 ENCOUNTER — Ambulatory Visit (INDEPENDENT_AMBULATORY_CARE_PROVIDER_SITE_OTHER): Payer: Medicare Other

## 2018-10-27 VITALS — BP 138/73 | HR 64 | Temp 97.7°F | Ht 61.0 in | Wt 183.0 lb

## 2018-10-27 DIAGNOSIS — M25511 Pain in right shoulder: Secondary | ICD-10-CM

## 2018-10-27 DIAGNOSIS — M25561 Pain in right knee: Secondary | ICD-10-CM

## 2018-10-27 DIAGNOSIS — T148XXA Other injury of unspecified body region, initial encounter: Secondary | ICD-10-CM

## 2018-10-27 DIAGNOSIS — Q682 Congenital deformity of knee: Secondary | ICD-10-CM

## 2018-10-27 DIAGNOSIS — L139 Bullous disorder, unspecified: Secondary | ICD-10-CM

## 2018-10-27 DIAGNOSIS — S8001XA Contusion of right knee, initial encounter: Secondary | ICD-10-CM | POA: Diagnosis not present

## 2018-10-27 DIAGNOSIS — W19XXXA Unspecified fall, initial encounter: Secondary | ICD-10-CM | POA: Diagnosis not present

## 2018-10-27 DIAGNOSIS — S5001XA Contusion of right elbow, initial encounter: Secondary | ICD-10-CM

## 2018-10-27 MED ORDER — BACLOFEN 5 MG PO TABS: 5.0000 mg | ORAL_TABLET | Freq: Two times a day (BID) | ORAL | 0 refills | Status: AC | PRN

## 2018-10-27 MED ORDER — CIPROFLOXACIN HCL 500 MG PO TABS: 500.0000 mg | ORAL_TABLET | Freq: Two times a day (BID) | ORAL | 0 refills | Status: AC

## 2018-10-27 NOTE — Patient Instructions (Signed)
Contusion    A contusion is a deep bruise. Contusions happen when an injury causes bleeding under the skin. Symptoms of bruising include pain, swelling, and discolored skin. The skin may turn blue, purple, or yellow.  Follow these instructions at home:   Rest the injured area.   If told, put ice on the injured area.  ? Put ice in a plastic bag.  ? Place a towel between your skin and the bag.  ? Leave the ice on for 20 minutes, 2-3 times per day.   If told, put light pressure (compression) on the injured area using an elastic bandage. Make sure the bandage is not too tight. Remove it and put it back on as told by your doctor.   If possible, raise (elevate) the injured area above the level of your heart while you are sitting or lying down.   Take over-the-counter and prescription medicines only as told by your doctor.  Contact a doctor if:   Your symptoms do not get better after several days of treatment.   Your symptoms get worse.   You have trouble moving the injured area.  Get help right away if:   You have very bad pain.   You have a loss of feeling (numbness) in a hand or foot.   Your hand or foot turns pale or cold.  This information is not intended to replace advice given to you by your health care provider. Make sure you discuss any questions you have with your health care provider.  Document Released: 10/13/2007 Document Revised: 10/02/2015 Document Reviewed: 09/11/2014  [employer] Interactive Patient Education  2019 [employer]

## 2018-10-27 NOTE — Progress Notes (Addendum)
Subjective:  Patient ID: Darlene Gregory, female    DOB: 05-21-1933, 85 y.o.   MRN: [id number]  Chief Complaint:  Fall   HPI: Darlene Gregory is a 85 y.o. female presenting on 10/27/2018 for Fall  Pt presents today for right shoulder and right knee pain after a fall on Wednesday.   Fall The accident occurred 2 days ago. The fall occurred while walking. She fell from a height of 1 to 2 ft. She landed on hard floor. The point of impact was the right shoulder, right elbow and right knee. The pain is present in the right knee and right shoulder. The pain is at a severity of 7/10. The pain is moderate. The symptoms are aggravated by ambulation, extension, flexion, pressure on injury and movement. Pertinent negatives include no abdominal pain, bowel incontinence, fever, headaches, hearing loss, hematuria, loss of consciousness, nausea, numbness, tingling, visual change or vomiting. She has tried acetaminophen, heat and ice for the symptoms. The treatment provided mild relief.     Relevant past medical, surgical, family, and social history reviewed and updated as indicated.  Allergies and medications reviewed and updated.   Past Medical History:  Diagnosis Date  . Cancer ([facility]) 10/2016   left breast cancer  . Difficult intubation    "small trachea"; 01/18/05: glidescope but unable to pass stylet -->fast track LMA with blind passage for ETT, consider awake intubation; 08/14/08: IV induction and DL with glidesecope for ETT  . DJD (degenerative joint disease)   . Frequent urination   . GERD (gastroesophageal reflux disease)   . Hypertension   . Hypothyroidism   . Osteoporosis   . PONV (postoperative nausea and vomiting)   . Thyroid disease     Past Surgical History:  Procedure Laterality Date  . ABDOMINAL HYSTERECTOMY    . BREAST LUMPECTOMY WITH RADIOACTIVE SEED LOCALIZATION Left 10/29/2016   Procedure: LEFT BREAST LUMPECTOMY WITH RADIOACTIVE SEED LOCALIZATION;  Surgeon: Excell Seltzer,  MD;  Location: [facility];  Service: General;  Laterality: Left;  . CAROTID BODY TUMOR EXCISION    . COLONOSCOPY W/ POLYPECTOMY    . EYE SURGERY Bilateral    cataract removed  . JOINT REPLACEMENT Right 2006   knee  . MASTECTOMY Left 2018  . PAROTIDECTOMY Left   . RE-EXCISION OF BREAST LUMPECTOMY Left 11/05/2016   Procedure: RE-EXCISION OF LEFT BREAST LUMPECTOMY;  Surgeon: Excell Seltzer, MD;  Location: [facility];  Service: General;  Laterality: Left;  . TOTAL KNEE ARTHROPLASTY Left 03/13/2014   Procedure: LEFT TOTAL KNEE ARTHROPLASTY;  Surgeon: Kerin Salen, MD;  Location: [facility];  Service: Orthopedics;  Laterality: Left;  . TOTAL MASTECTOMY Left 11/12/2016   Procedure: LEFT TOTAL MASTECTOMY;  Surgeon: Excell Seltzer, MD;  Location: [facility];  Service: General;  Laterality: Left;    Social History   Socioeconomic History  . Marital status: Married    Spouse name: Not on file  . Number of children: 2  . Years of education: 20  . Highest education level: 12th grade  Occupational History  . 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 Financial resource strain: Not hard at all  . Food insecurity    Worry: Never true    Inability: Never true  . Transportation needs    Medical: No    Non-medical: No  Tobacco Use  . Smoking status: Former Research scientist (life sciences)  . Smokeless tobacco: Never Used  Substance  and Sexual Activity  . Alcohol use: No  . Drug use: No  . Sexual activity: Not Currently  Lifestyle  . Physical activity    Days per week: 3 days    Minutes per session: 60 min  . Stress: Only a little  Relationships  . Social connections    Talks on phone: More than three times a week    Gets together: More than three times a week    Attends religious service: More than 4 times per year    Active member of club or organization: Yes    Attends meetings of clubs or organizations: More than 4 times per year     Relationship status: Married  . Intimate partner violence    Fear of current or ex partner: No    Emotionally abused: No    Physically abused: No    Forced sexual activity: No  Other Topics Concern  . Not on file  Social History Narrative  . Not on file    Outpatient Encounter Medications as of 10/27/2018  Medication Sig  . aspirin EC 81 MG tablet Take 1 tablet (81 mg total) by mouth daily.  . Cholecalciferol (VITAMIN D) 2000 UNITS CAPS Take by mouth daily.  . cloNIDine (CATAPRES) 0.1 MG tablet Take 1 tablet (0.1 mg total) by mouth 2 (two) times daily.  Marland Kitchen FIBER PO Take 2 each by mouth daily.   . fish oil-omega-3 fatty acids 1000 MG capsule Take 1 g by mouth daily.  . furosemide (LASIX) 20 MG tablet Take 1 tablet (20 mg total) by mouth daily.  Marland Kitchen levothyroxine (SYNTHROID, LEVOTHROID) 88 MCG tablet Take 1 tablet (88 mcg total) by mouth daily.  . Multiple Vitamins-Minerals (ICAPS PO) Take 1 tablet by mouth daily.   Marland Kitchen olmesartan (BENICAR) 40 MG tablet Take 1 tablet (40 mg total) by mouth daily.  Marland Kitchen omeprazole (PRILOSEC) 20 MG capsule Take 1 capsule (20 mg total) by mouth daily.  . tamoxifen (NOLVADEX) 20 MG tablet TAKE 1 TABLET BY MOUTH ONCE DAILY  . Baclofen 5 MG TABS Take 5 mg by mouth 2 (two) times daily as needed for up to 5 days.  . ciprofloxacin (CIPRO) 500 MG tablet Take 1 tablet (500 mg total) by mouth 2 (two) times daily for 5 days.  . Fe Fum-FA-B Cmp-C-Zn-Mg-Mn-Cu (HEMOCYTE PLUS) 106-1 MG CAPS Take 1 tablet by mouth at bedtime. (Patient not taking: Reported on 10/27/2018)  . vitamin C (ASCORBIC ACID) 500 MG tablet Take 500 mg by mouth daily.  . [DISCONTINUED] busPIRone (BUSPAR) 15 MG tablet Take 1 tablet (15 mg total) by mouth 3 (three) times daily. (Patient not taking: Reported on 03/23/2018)  . [DISCONTINUED] HYDROcodone-homatropine (HYCODAN) 5-1.5 MG/5ML syrup Take 5 mLs by mouth every 6 (six) hours as needed for cough.  . [DISCONTINUED] loratadine (CLARITIN) 10 MG tablet Take 1  tablet (10 mg total) by mouth daily.  . [DISCONTINUED] pantoprazole (PROTONIX) 40 MG tablet Take 1 tablet by mouth once daily   No facility-administered encounter medications on file as of 10/27/2018.     Allergies  Allergen Reactions  . Hyoscyamine Sulfate     REACTION: rash  . Norvasc [Amlodipine Besylate] Swelling  . Phenazopyridine Hcl Rash  . Sulfonamide Derivatives Rash    Review of Systems  Constitutional: Negative for appetite change, chills, fatigue and fever.  Respiratory: Negative for cough, chest tightness and shortness of breath.   Cardiovascular: Positive for leg swelling. Negative for chest pain and palpitations.  Gastrointestinal: Negative for  abdominal pain, bowel incontinence, nausea and vomiting.  Genitourinary: Negative for hematuria.  Musculoskeletal: Positive for arthralgias, joint swelling and myalgias. Negative for back pain, gait problem, neck pain and neck stiffness.  Skin: Positive for color change and wound.  Neurological: Negative for dizziness, tingling, tremors, seizures, loss of consciousness, syncope, facial asymmetry, speech difficulty, weakness, light-headedness, numbness and headaches.  Psychiatric/Behavioral: Negative for confusion.  All other systems reviewed and are negative.       Objective:  BP 138/73   Pulse 64   Temp 97.7 F (36.5 C) (Oral)   Ht '5\' 1"'$  (1.549 m)   Wt 183 lb (83 kg)   BMI 34.58 kg/m    Wt Readings from Last 3 Encounters:  10/27/18 183 lb (83 kg)  03/23/18 183 lb (83 kg)  12/09/17 183 lb (83 kg)    Physical Exam Vitals signs and nursing note reviewed.  Constitutional:      General: She is not in acute distress.    Appearance: Normal appearance. She is obese. She is not ill-appearing or toxic-appearing.  HENT:     Head: Normocephalic and atraumatic.     Mouth/Throat:     Mouth: Mucous membranes are moist.     Pharynx: Oropharynx is clear.  Eyes:     Extraocular Movements: Extraocular movements intact.      Conjunctiva/sclera: Conjunctivae normal.     Pupils: Pupils are equal, round, and reactive to light.  Cardiovascular:     Rate and Rhythm: Normal rate and regular rhythm.     Pulses: Normal pulses.     Heart sounds: Normal heart sounds. No murmur. No friction rub. No gallop.   Pulmonary:     Effort: Pulmonary effort is normal. No respiratory distress.     Breath sounds: Normal breath sounds.  Abdominal:     General: Bowel sounds are normal. There is no distension.     Palpations: Abdomen is soft.     Tenderness: There is no abdominal tenderness.  Musculoskeletal:     Right shoulder: She exhibits tenderness, pain and spasm. She exhibits normal range of motion, no bony tenderness, no swelling, no effusion, no crepitus, no deformity, no laceration, normal pulse and normal strength.     Right elbow: She exhibits normal range of motion, no swelling, no effusion, no deformity and no laceration. No tenderness found. No radial head, no medial epicondyle, no lateral epicondyle and no olecranon process tenderness noted.     Right wrist: Normal.     Right knee: She exhibits swelling, ecchymosis and erythema. She exhibits normal range of motion, no effusion, no deformity, no laceration, normal alignment, no LCL laxity, normal patellar mobility, no bony tenderness, normal meniscus and no MCL laxity. Tenderness found.     Right ankle: Normal.     Cervical back: Normal.     Lumbar back: Normal.       Arms:       Legs:  Skin:    General: Skin is warm and dry.     Capillary Refill: Capillary refill takes less than 2 seconds.  Neurological:     General: No focal deficit present.     Mental Status: She is alert and oriented to person, place, and time.     Cranial Nerves: No cranial nerve deficit.     Sensory: No sensory deficit.     Motor: No weakness.     Coordination: Coordination normal.     Gait: Gait normal.     Deep Tendon Reflexes: Reflexes  normal.  Psychiatric:        Mood and Affect: Mood  normal.        Behavior: Behavior normal. Behavior is cooperative.        Thought Content: Thought content normal.        Judgment: Judgment normal.     Results for orders placed or performed in visit on 03/23/18  CMP14+EGFR  Result Value Ref Range   Glucose 102 (H) 65 - 99 mg/dL   BUN 26 8 - 27 mg/dL   Creatinine, Ser 1.75 (H) 0.57 - 1.00 mg/dL   GFR calc non Af Amer 27 (L) >59 mL/min/1.73   GFR calc Af Amer 31 (L) >59 mL/min/1.73   BUN/Creatinine Ratio 15 12 - 28   Sodium 144 134 - 144 mmol/L   Potassium 4.4 3.5 - 5.2 mmol/L   Chloride 105 96 - 106 mmol/L   CO2 24 20 - 29 mmol/L   Calcium 9.1 8.7 - 10.3 mg/dL   Total Protein 6.2 6.0 - 8.5 g/dL   Albumin 3.9 3.5 - 4.7 g/dL   Globulin, Total 2.3 1.5 - 4.5 g/dL   Albumin/Globulin Ratio 1.7 1.2 - 2.2   Bilirubin Total 0.2 0.0 - 1.2 mg/dL   Alkaline Phosphatase 75 39 - 117 IU/L   AST 13 0 - 40 IU/L   ALT 12 0 - 32 IU/L  Lipid panel  Result Value Ref Range   Cholesterol, Total 148 100 - 199 mg/dL   Triglycerides 70 0 - 149 mg/dL   HDL 67 >39 mg/dL   VLDL Cholesterol Cal 14 5 - 40 mg/dL   LDL Calculated 67 0 - 99 mg/dL   Chol/HDL Ratio 2.2 0.0 - 4.4 ratio  Thyroid Panel With TSH  Result Value Ref Range   TSH 5.760 (H) 0.450 - 4.500 uIU/mL   T4, Total 10.1 4.5 - 12.0 ug/dL   T3 Uptake Ratio 29 24 - 39 %   Free Thyroxine Index 2.9 1.2 - 4.9  CBC with Differential/Platelet  Result Value Ref Range   WBC 6.5 3.4 - 10.8 x10E3/uL   RBC 3.65 (L) 3.77 - 5.28 x10E6/uL   Hemoglobin 11.3 11.1 - 15.9 g/dL   Hematocrit 34.7 34.0 - 46.6 %   MCV 95 79 - 97 fL   MCH 31.0 26.6 - 33.0 pg   MCHC 32.6 31.5 - 35.7 g/dL   RDW 12.0 (L) 12.3 - 15.4 %   Platelets 173 150 - 450 x10E3/uL   Neutrophils 63 Not Estab. %   Lymphs 23 Not Estab. %   Monocytes 8 Not Estab. %   Eos 5 Not Estab. %   Basos 1 Not Estab. %   Neutrophils Absolute 4.1 1.4 - 7.0 x10E3/uL   Lymphocytes Absolute 1.5 0.7 - 3.1 x10E3/uL   Monocytes Absolute 0.5 0.1 - 0.9  x10E3/uL   EOS (ABSOLUTE) 0.3 0.0 - 0.4 x10E3/uL   Basophils Absolute 0.1 0.0 - 0.2 x10E3/uL   Immature Granulocytes 0 Not Estab. %   Immature Grans (Abs) 0.0 0.0 - 0.1 x10E3/uL     X-Ray: Right shoulder: No acute findings. Preliminary x-ray reading by Monia Pouch, FNP-C, [facility].  X-Ray: Right knee: Hardware intact. No acute findings. Preliminary x-ray reading by Monia Pouch, FNP-C, [facility].   Pertinent labs & imaging results that were available during my care of the patient were reviewed by me and considered in my medical decision making.  Assessment & Plan:  Wilda was seen today for fall.  Diagnoses and  all orders for this visit:  Fall, initial encounter -     DG Knee 1-2 Views Right; Future -     DG Shoulder Right; Future  Acute pain of right knee Xray without acute findings. Symptomatic care discussed. Rest, ice or heat, and elevation. Can take tylenol. No NSAIDs due to renal function.  -     Baclofen 5 MG TABS; Take 5 mg by mouth 2 (two) times daily as needed for up to 5 days.  Contusion of right knee, initial encounter Bullous eruption, localized Pt has had total knee replacement and has two bullous eruptions with surrounding erythema. Will treat empirically with below. Pt aware to report any new or worsening symptoms including fever, chills, confusion, increased pain or swelling, or weakness.  -     ciprofloxacin (CIPRO) 500 MG tablet; Take 1 tablet (500 mg total) by mouth 2 (two) times daily for 5 days.  Acute pain of right shoulder Contusion of right elbow, initial encounter Xray without acute findings. Will notify pt if reading differs. Symptomatic care discussed. Report any new or worsening symptoms. Spasm of muscle noted. Will trial below for pain.  -     Baclofen 5 MG TABS; Take 5 mg by mouth 2 (two) times daily as needed for up to 5 days.  Contusion of right elbow, initial encounter Symptomatic care discussed. Report any new or worsening symptoms.    Consulted  with Dr. Livia Snellen and he assessed the pt and agreed with above plan.   Continue all other maintenance medications.  Follow up plan: Return in about 4 weeks (around 11/24/2018), or if symptoms worsen or fail to improve, for knee pain, shoulder pain.  Educational handout given for contusion   The above assessment and management plan was discussed with the patient. The patient verbalized understanding of and has agreed to the management plan. Patient is aware to call the clinic if symptoms persist or worsen. Patient is aware when to return to the clinic for a follow-up visit. Patient educated on when it is appropriate to go to the emergency department.   Monia Pouch, FNP-C [facility] [phone]   Radiology reading below: Evidence of avulsion fracture of the right patellar tibial insertion, with associated soft tissue swelling and patella [place].  Pt called and made aware of results. Pt returned to office and knee immobilizer placed. Pt will follow up with Dr. Mayer Camel.   Monia Pouch, FNP-C

## 2018-10-28 ENCOUNTER — Telehealth: Payer: Self-pay | Admitting: Oncology

## 2018-10-28 NOTE — Telephone Encounter (Signed)
6/22 visit converted to telephone visit, lab cancelled per provider. Confirmed with patient.

## 2018-10-29 NOTE — Progress Notes (Addendum)
Darlene Gregory  [phone] [phone]     ID: Darlene Gregory DOB: October 25, 1933  MR#: [id number]  TMA#:[id number]  Patient Care Team: Chevis Pretty, [place] as PCP - General (Nurse Practitioner) Excell Seltzer, MD as Consulting Physician (General Surgery) Luciel Brickman, Virgie Dad, MD as Consulting Physician (Oncology) Eppie Gibson, MD as Attending Physician (Radiation Oncology) Arvella Nigh, MD as Consulting Physician (Obstetrics and Gynecology) OTHER MD:  CHIEF COMPLAINT: Estrogen receptor positive breast cancer  CURRENT TREATMENT: Tamoxifen   I connected with Darlene Gregory on 10/30/18 at  9:00 AM EDT by telephone visit and verified that I am speaking with the correct person using two identifiers.   I discussed the limitations, risks, security and privacy concerns of performing an evaluation and management service by telemedicine and the availability of in-person appointments. I also discussed with the patient that there may be a patient responsible charge related to this service. The patient expressed understanding and agreed to proceed.   Other persons participating in the visit and their role in the encounter:   - Biomedical scientist, Medical Scribe   Patient's location: Home  Provider's location: [place]   Chief Complaint: Estrogen receptor positive breast cancer    BREAST CANCER HISTORY: From the original intake note:   Darlene Gregory had screening mammography April 2018 showing a possible mass in the left breast. She was referred to the [facility] where she underwent left mammography and ultrasonography on 09/10/2016. This showed the breast density to be category C. In the upper outer quadrant of the breast there was an area of architectural distortion which was not palpable. Ultrasonography confirmed a 0.9 cm hypoechoic mass at the 1:30 o'clock position of the left breast 3 cm from the nipple. Ultrasound of the left axilla showed a  suspicious lymph node.  On 09/24/2016 the patient underwent biopsy of the left breast mass and the left axillary lymph node in question. The lymph node was benign, and this was read as concordant by radiology. The left breast mass however showed invasive ductal carcinoma, grade 2, estrogen receptor 100% positive, progesterone receptor 95% positive, both with strong staining intensity, with an MIB-1 of 5%, and no HER-2 amplification, the signals ratio being 1.46 and the number per cell 1.90.  The patient's subsequent history is as detailed below   INTERVAL HISTORY: Darlene Gregory is contacted today for follow-up and treatment of her estrogen receptor positive breast cancer.  She continues on tamoxifen. She has some occasional got flashes.   Since her last visit here[facility]  We do not have those results.   REVIEW OF SYSTEMS: Darlene Gregory notes that she fell recently on 10/25/2018 and got a hairline fracture in her knee.  She will be seeing her orthopedic doctor in the next week or so and is pretty much staying around the house until then.  A detailed review of systems was otherwise noncontributory.    PAST MEDICAL HISTORY: Past Medical History:  Diagnosis Date  . Cancer ([facility]) 10/2016   left breast cancer  . Difficult intubation    "small trachea"; 01/18/05: glidescope but unable to pass stylet -->fast track LMA with blind passage for ETT, consider awake intubation; 08/14/08: IV induction and DL with glidesecope for ETT  . DJD (degenerative joint disease)   . Frequent urination   . GERD (gastroesophageal reflux disease)   . Hypertension   . Hypothyroidism   . Osteoporosis   . PONV (postoperative nausea and  vomiting)   . Thyroid disease     PAST SURGICAL HISTORY: Past Surgical History:  Procedure Laterality Date  . ABDOMINAL HYSTERECTOMY    . BREAST LUMPECTOMY WITH RADIOACTIVE SEED LOCALIZATION Left 10/29/2016   Procedure: LEFT BREAST LUMPECTOMY WITH  RADIOACTIVE SEED LOCALIZATION;  Surgeon: Excell Seltzer, MD;  Location: [facility];  Service: General;  Laterality: Left;  . CAROTID BODY TUMOR EXCISION    . COLONOSCOPY W/ POLYPECTOMY    . EYE SURGERY Bilateral    cataract removed  . JOINT REPLACEMENT Right 2006   knee  . MASTECTOMY Left 2018  . PAROTIDECTOMY Left   . RE-EXCISION OF BREAST LUMPECTOMY Left 11/05/2016   Procedure: RE-EXCISION OF LEFT BREAST LUMPECTOMY;  Surgeon: Excell Seltzer, MD;  Location: [facility];  Service: General;  Laterality: Left;  . TOTAL KNEE ARTHROPLASTY Left 03/13/2014   Procedure: LEFT TOTAL KNEE ARTHROPLASTY;  Surgeon: Kerin Salen, MD;  Location: [facility];  Service: Orthopedics;  Laterality: Left;  . TOTAL MASTECTOMY Left 11/12/2016   Procedure: LEFT TOTAL MASTECTOMY;  Surgeon: Excell Seltzer, MD;  Location: [facility];  Service: General;  Laterality: Left;    FAMILY HISTORY Family History  Problem Relation Age of Onset  . Heart disease Mother   . Emphysema Father   . Cancer Sister        ovarian  The patient's father died at the age of 55 from emphysema. The patient's mother died from "old age" at 34. The patient had one brother, 3 sisters. There is no history of breast cancer in the family. One sister had "a female cancer", but the patient is not sure what it may have been.  GYNECOLOGIC HISTORY:  No LMP recorded. Patient has had a hysterectomy. The patient is status post total abdominal hysterectomy with bilateral salpingo-oophorectomy remotely. She is GX P2, first live birth age 19. She never used oral contraceptives.  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 Her husband Darlene Gregory has been a Psychologist, sport and exercise. They currently have 41 acres. Son Darlene Gregory, 56, lives in [place] and works for [employer]. Son Darlene Gregory lives in [place] and works for [employer]. The patient has 2 grandchildren and some  great-grandchildren. She is a Psychologist, forensic.    ADVANCED DIRECTIVES: In place   HEALTH MAINTENANCE: Social History   Tobacco Use  . Smoking status: Former Research scientist (life sciences)  . Smokeless tobacco: Never Used  Substance Use Topics  . Alcohol use: No  . Drug use: No     Colonoscopy: Repeat due 2019/[facility]  PAP:  Bone density: 04/20/2013, T score -2.3   Allergies  Allergen Reactions  . Hyoscyamine Sulfate     REACTION: rash  . Norvasc [Amlodipine Besylate] Swelling  . Phenazopyridine Hcl Rash  . Sulfonamide Derivatives Rash    Current Outpatient Medications  Medication Sig Dispense Refill  . aspirin EC 81 MG tablet Take 1 tablet (81 mg total) by mouth daily. 100 tablet 4  . Baclofen 5 MG TABS Take 5 mg by mouth 2 (two) times daily as needed for up to 5 days. 15 tablet 0  . Cholecalciferol (VITAMIN D) 2000 UNITS CAPS Take by mouth daily.    . ciprofloxacin (CIPRO) 500 MG tablet Take 1 tablet (500 mg total) by mouth 2 (two) times daily for 5 days. 10 tablet 0  . cloNIDine (CATAPRES) 0.1 MG tablet Take 1 tablet (0.1 mg total) by mouth 2 (two) times daily. 180 tablet  1  . Fe Fum-FA-B Cmp-C-Zn-Mg-Mn-Cu (HEMOCYTE PLUS) 106-1 MG CAPS Take 1 tablet by mouth at bedtime. (Patient not taking: Reported on 10/27/2018) 90 each 1  . FIBER PO Take 2 each by mouth daily.     . fish oil-omega-3 fatty acids 1000 MG capsule Take 1 g by mouth daily.    . furosemide (LASIX) 20 MG tablet Take 1 tablet (20 mg total) by mouth daily. 90 tablet 1  . levothyroxine (SYNTHROID, LEVOTHROID) 88 MCG tablet Take 1 tablet (88 mcg total) by mouth daily. 90 tablet 1  . Multiple Vitamins-Minerals (ICAPS PO) Take 1 tablet by mouth daily.     Marland Kitchen olmesartan (BENICAR) 40 MG tablet Take 1 tablet (40 mg total) by mouth daily. 90 tablet 0  . omeprazole (PRILOSEC) 20 MG capsule Take 1 capsule (20 mg total) by mouth daily. 90 capsule 1  . tamoxifen (NOLVADEX) 20 MG tablet TAKE 1 TABLET BY MOUTH ONCE DAILY 90 tablet 10  . vitamin C (ASCORBIC  ACID) 500 MG tablet Take 500 mg by mouth daily.     No current facility-administered medications for this visit.     OBJECTIVE: Elderly white woman in no acute distress There were no vitals filed for this visit.   There is no height or weight on file to calculate BMI.    ECOG FS:1 - Symptomatic but completely ambulatory  LAB RESULTS:  CMP     Component Value Date/Time   NA 144 03/23/2018 1129   NA 141 03/14/2017 1056   K 4.4 03/23/2018 1129   K 4.6 03/14/2017 1056   CL 105 03/23/2018 1129   CO2 24 03/23/2018 1129   CO2 28 03/14/2017 1056   GLUCOSE 102 (H) 03/23/2018 1129   GLUCOSE 99 09/26/2017 0938   GLUCOSE 90 03/14/2017 1056   BUN 26 03/23/2018 1129   BUN 29.9 (H) 03/14/2017 1056   CREATININE 1.75 (H) 03/23/2018 1129   CREATININE 1.83 (H) 09/26/2017 0938   CREATININE 1.8 (H) 03/14/2017 1056   CALCIUM 9.1 03/23/2018 1129   CALCIUM 9.4 03/14/2017 1056   PROT 6.2 03/23/2018 1129   PROT 6.6 03/14/2017 1056   ALBUMIN 3.9 03/23/2018 1129   ALBUMIN 3.4 (L) 03/14/2017 1056   AST 13 03/23/2018 1129   AST 15 09/26/2017 0938   AST 14 03/14/2017 1056   ALT 12 03/23/2018 1129   ALT 15 09/26/2017 0938   ALT 11 03/14/2017 1056   ALKPHOS 75 03/23/2018 1129   ALKPHOS 69 03/14/2017 1056   BILITOT 0.2 03/23/2018 1129   BILITOT 0.3 09/26/2017 0938   BILITOT 0.38 03/14/2017 1056   GFRNONAA 27 (L) 03/23/2018 1129   GFRNONAA 25 (L) 09/26/2017 0938   GFRNONAA 49 (L) 11/24/2012 0823   GFRAA 31 (L) 03/23/2018 1129   GFRAA 28 (L) 09/26/2017 0938   GFRAA 56 (L) 11/24/2012 0823    No results found for: TOTALPROTELP, ALBUMINELP, A1GS, A2GS, BETS, BETA2SER, GAMS, MSPIKE, SPEI  No results found for: KPAFRELGTCHN, LAMBDASER, [facility]  Lab Results  Component Value Date   WBC 6.5 03/23/2018   NEUTROABS 4.1 03/23/2018   HGB 11.3 03/23/2018   HCT 34.7 03/23/2018   MCV 95 03/23/2018   PLT 173 03/23/2018      Chemistry      Component Value Date/Time   NA 144 03/23/2018 1129    NA 141 03/14/2017 1056   K 4.4 03/23/2018 1129   K 4.6 03/14/2017 1056   CL 105 03/23/2018 1129   CO2 24 03/23/2018 1129  CO2 28 03/14/2017 1056   BUN 26 03/23/2018 1129   BUN 29.9 (H) 03/14/2017 1056   CREATININE 1.75 (H) 03/23/2018 1129   CREATININE 1.83 (H) 09/26/2017 0938   CREATININE 1.8 (H) 03/14/2017 1056      Component Value Date/Time   CALCIUM 9.1 03/23/2018 1129   CALCIUM 9.4 03/14/2017 1056   ALKPHOS 75 03/23/2018 1129   ALKPHOS 69 03/14/2017 1056   AST 13 03/23/2018 1129   AST 15 09/26/2017 0938   AST 14 03/14/2017 1056   ALT 12 03/23/2018 1129   ALT 15 09/26/2017 0938   ALT 11 03/14/2017 1056   BILITOT 0.2 03/23/2018 1129   BILITOT 0.3 09/26/2017 0938   BILITOT 0.38 03/14/2017 1056       No results found for: LABCA2  No components found for: [id number]  No results for input(s): INR in the last 168 hours.  Urinalysis    Component Value Date/Time   APPEARANCEUR Clear 11/27/2015 1132   GLUCOSEU Negative 11/27/2015 1132   BILIRUBINUR Negative 11/27/2015 1132   PROTEINUR Negative 11/27/2015 1132   NITRITE Negative 11/27/2015 1132   LEUKOCYTESUR 1+ (A) 11/27/2015 1132     STUDIES: Dg Shoulder Right  Result Date: 10/27/2018 CLINICAL DATA:  Right shoulder pain secondary to a fall last night. EXAM: RIGHT SHOULDER - 2+ VIEW COMPARISON:  None. FINDINGS: There is no fracture or dislocation.  Slight AC joint arthropathy. IMPRESSION: No acute abnormality. Electronically Signed   By: Lorriane Shire M.D.   On: 10/27/2018 14:32   Dg Knee 1-2 Views Right  Result Date: 10/27/2018 CLINICAL DATA:  85 year old female with a history of fall EXAM: RIGHT KNEE - 1-2 VIEW COMPARISON:  None. FINDINGS: Surgical changes of bilateral arthroplasty. The anterior view of the right knee demonstrates patella Darlene Gregory. On the lateral view patella Darlene Gregory is visualized with irregularity at the inferior patellar margin, likely representing avulsion fracture from the tibial tuberosity. Soft  tissue swelling in the anterior knee. Deformity at the proximal left fibula, likely posttraumatic/chronic. IMPRESSION: Evidence of avulsion fracture of the right patellar tibial insertion, with associated soft tissue swelling and patella [place]. Tendinous/soft tissue injury not excluded, and correlation with any prior plain films may be useful, or alternatively MRI. Electronically Signed   By: Corrie Mckusick D.O.   On: 10/27/2018 14:31     ELIGIBLE FOR AVAILABLE RESEARCH PROTOCOL: no  ASSESSMENT: 85 y.o. [place], [place] woman status post left breast upper outer quadrant biopsy 09/24/2016 for a clinical T1B N0, stage IA invasive ductal carcinoma, grade 2, estrogen and progesterone receptor positive, HER-2 not amplified, with an MIB-1 of 5%  (1) Status post left lumpectomy 10/29/2016 for a pT1c pNX, stage IA invasive ductal carcinoma, grade 2, with positive margins  (a) additional surgery 11/05/2016 removed additional ductal carcinoma in situ, but margins were still positive  (b) left mastectomy with sentinel lymph node sampling 11/12/2016 showed pT1s pN0, with negative margins  (2) adjuvant radiation not indicated  (3) tamoxifen started 11/30/2016.  (a) bone density December 2014 shows a T score of -2.3   PLAN: Caterra is now 2 years out from definitive surgery for her breast cancer with no evidence of disease activity.  This is very favorable.  She is tolerating tamoxifen well and the plan will be to continue that a total of 5 years.  She tells me she will be seeing her primary care physician within the next month and she will make sure the labs drawn there will be forwarded to [place].  [facility]  She will see me again in 1 year.  She knows to call for any other issues that may develop before that visit.  Ovie Eastep, Virgie Dad, MD  10/30/18 8:59 AM Medical Oncology and Hematology [facility] [address] [place], [place] [address]  Tel. [phone]    Fax. [phone]  I, Jacqualyn Posey am acting as a Education administrator for Chauncey Cruel, MD.   I, Lurline Del MD, have reviewed the above documentation for accuracy and completeness, and I agree with the above.  We obtained a copy of the mammography, bilateral, with Tomo, on Ms. Heinzman [facility].  It shows the breast density to be category B.  There was no evidence of malignancy.

## 2018-10-30 ENCOUNTER — Telehealth: Payer: Self-pay | Admitting: Nurse Practitioner

## 2018-10-30 ENCOUNTER — Inpatient Hospital Stay: Payer: Medicare Other

## 2018-10-30 ENCOUNTER — Inpatient Hospital Stay: Payer: Medicare Other | Attending: Oncology | Admitting: Oncology

## 2018-10-30 DIAGNOSIS — C50412 Malignant neoplasm of upper-outer quadrant of left female breast: Secondary | ICD-10-CM

## 2018-10-30 DIAGNOSIS — Z170 Estrogen receptor positive status [ER+]: Secondary | ICD-10-CM | POA: Diagnosis not present

## 2018-10-30 DIAGNOSIS — Z79810 Long term (current) use of selective estrogen receptor modulators (SERMs): Secondary | ICD-10-CM | POA: Diagnosis not present

## 2018-10-30 NOTE — Telephone Encounter (Signed)
Blisters busted on legs = she is aware to clean with normal saline and use antibiotic ointment as needed = has follow up with [facility] Thursday and will have them look at it.

## 2018-11-08 ENCOUNTER — Other Ambulatory Visit: Payer: Self-pay | Admitting: Nurse Practitioner

## 2018-11-08 DIAGNOSIS — I10 Essential (primary) hypertension: Secondary | ICD-10-CM

## 2018-11-09 ENCOUNTER — Other Ambulatory Visit: Payer: Self-pay | Admitting: Nurse Practitioner

## 2018-11-09 DIAGNOSIS — I10 Essential (primary) hypertension: Secondary | ICD-10-CM

## 2018-11-10 ENCOUNTER — Other Ambulatory Visit: Payer: Self-pay | Admitting: *Deleted

## 2018-11-15 ENCOUNTER — Other Ambulatory Visit: Payer: Self-pay | Admitting: Nurse Practitioner

## 2018-11-20 ENCOUNTER — Ambulatory Visit: Payer: Medicare Other | Admitting: Nurse Practitioner

## 2018-11-21 ENCOUNTER — Encounter: Payer: Self-pay | Admitting: Nurse Practitioner

## 2018-11-21 ENCOUNTER — Ambulatory Visit (INDEPENDENT_AMBULATORY_CARE_PROVIDER_SITE_OTHER): Payer: Medicare Other | Admitting: Nurse Practitioner

## 2018-11-21 ENCOUNTER — Other Ambulatory Visit: Payer: Self-pay

## 2018-11-21 ENCOUNTER — Telehealth: Payer: Self-pay | Admitting: Nurse Practitioner

## 2018-11-21 VITALS — BP 142/88 | HR 52 | Temp 98.2°F | Ht 61.0 in | Wt 179.8 lb

## 2018-11-21 DIAGNOSIS — F411 Generalized anxiety disorder: Secondary | ICD-10-CM

## 2018-11-21 DIAGNOSIS — K5900 Constipation, unspecified: Secondary | ICD-10-CM

## 2018-11-21 DIAGNOSIS — R609 Edema, unspecified: Secondary | ICD-10-CM

## 2018-11-21 DIAGNOSIS — M1009 Idiopathic gout, multiple sites: Secondary | ICD-10-CM

## 2018-11-21 DIAGNOSIS — N183 Chronic kidney disease, stage 3 unspecified: Secondary | ICD-10-CM

## 2018-11-21 DIAGNOSIS — E034 Atrophy of thyroid (acquired): Secondary | ICD-10-CM | POA: Diagnosis not present

## 2018-11-21 DIAGNOSIS — K5730 Diverticulosis of large intestine without perforation or abscess without bleeding: Secondary | ICD-10-CM

## 2018-11-21 DIAGNOSIS — D509 Iron deficiency anemia, unspecified: Secondary | ICD-10-CM

## 2018-11-21 DIAGNOSIS — K219 Gastro-esophageal reflux disease without esophagitis: Secondary | ICD-10-CM | POA: Diagnosis not present

## 2018-11-21 DIAGNOSIS — M810 Age-related osteoporosis without current pathological fracture: Secondary | ICD-10-CM

## 2018-11-21 DIAGNOSIS — I10 Essential (primary) hypertension: Secondary | ICD-10-CM | POA: Diagnosis not present

## 2018-11-21 DIAGNOSIS — Z6832 Body mass index (BMI) 32.0-32.9, adult: Secondary | ICD-10-CM

## 2018-11-21 MED ORDER — FUROSEMIDE 20 MG PO TABS: 20.0000 mg | ORAL_TABLET | Freq: Every day | ORAL | 1 refills | Status: DC

## 2018-11-21 MED ORDER — OMEPRAZOLE 20 MG PO CPDR: 20.0000 mg | DELAYED_RELEASE_CAPSULE | Freq: Every day | ORAL | 1 refills | Status: DC

## 2018-11-21 MED ORDER — CLONIDINE HCL 0.1 MG PO TABS: 0.1000 mg | ORAL_TABLET | Freq: Two times a day (BID) | ORAL | 1 refills | Status: DC

## 2018-11-21 MED ORDER — OLMESARTAN MEDOXOMIL 40 MG PO TABS: 40.0000 mg | ORAL_TABLET | Freq: Every day | ORAL | 1 refills | Status: DC

## 2018-11-21 NOTE — Patient Instructions (Signed)
Peripheral Edema  Peripheral edema is swelling that is caused by a buildup of fluid. Peripheral edema most often affects the lower legs, ankles, and feet. It can also develop in the arms, hands, and face. The area of the body that has peripheral edema will look swollen. It may also feel heavy or warm. Your clothes may start to feel tight. Pressing on the area may make a temporary dent in your skin. You may not be able to move your swollen arm or leg as much as usual. There are many causes of peripheral edema. It can happen because of a complication of other conditions such as congestive heart failure, kidney disease, or a problem with your blood circulation. It also can be a side effect of certain medicines or because of an infection. It often happens to women during pregnancy. Sometimes, the cause is not known. Follow these instructions at home: Managing pain, stiffness, and swelling   Raise (elevate) your legs while you are sitting or lying down.  Move around often to prevent stiffness and to lessen swelling.  Do not sit or stand for long periods of time.  Wear support stockings as told by your health care provider. Medicines  Take over-the-counter and prescription medicines only as told by your health care provider.  Your health care provider may prescribe medicine to help your body get rid of excess water (diuretic). General instructions  Pay attention to any changes in your symptoms.  Follow instructions from your health care provider about limiting salt (sodium) in your diet. Sometimes, eating less salt may reduce swelling.  Moisturize skin daily to help prevent skin from cracking and draining.  Keep all follow-up visits as told by your health care provider. This is important. Contact a health care provider if you have:  A fever.  Edema that starts suddenly or is getting worse, especially if you are pregnant or have a medical condition.  Swelling in only one leg.  Increased  swelling, redness, or pain in one or both of your legs.  Drainage or sores at the area where you have edema. Get help right away if you:  Develop shortness of breath, especially when you are lying down.  Have pain in your chest or abdomen.  Feel weak.  Feel faint. Summary  Peripheral edema is swelling that is caused by a buildup of fluid. Peripheral edema most often affects the lower legs, ankles, and feet.  Move around often to prevent stiffness and to lessen swelling. Do not sit or stand for long periods of time.  Pay attention to any changes in your symptoms.  Contact a health care provider if you have edema that starts suddenly or is getting worse, especially if you are pregnant or have a medical condition.  Get help right away if you develop shortness of breath, especially when lying down. This information is not intended to replace advice given to you by your health care provider. Make sure you discuss any questions you have with your health care provider. Document Released: 06/03/2004 Document Revised: 01/18/2018 Document Reviewed: 01/18/2018 Elsevier Patient Education  2020 [employer].

## 2018-11-21 NOTE — Progress Notes (Signed)
Subjective:    Patient ID: Darlene Gregory, female    DOB: 1934/04/16, 85 y.o.   MRN: [id number]   Chief Complaint: medical management of chronic issues   HPI:  1. Essential hypertension, benign No c/o chest pain, sob or headache.does not check bloodpressure at home. BP Readings from Last 3 Encounters:  10/27/18 138/73  03/23/18 (!) 143/71  12/09/17 128/64     2. Gastroesophageal reflux disease without esophagitis Takes omeprazole most days. No symptoms as long as she takes medication  3. Diverticulosis of colon No recent flare ups. Says she controls it with diet. She knows the foods that cause flare up.  4. Hypothyroidism due to acquired atrophy of thyroid No problems that she is aware of. Last TSH was 5.7 and we oncreased levothyroxin dose to 15mg daily  5. Age-related osteoporosis without current pathological fracture ;ast dexascan was done in 2014. Patient doe snot want to do anymore.   6. CKD (chronic kidney disease), stage III (HCC) Last creatine was 1.75.  7. Iron deficiency anemia, unspecified iron deficiency anemia type Last hgb was 11.3. denies fatigue. She stopped taking her iron supplement because of constipation.  8. GAD (generalized anxiety disorder) She stays stressed and worries a lot, but is on no rx meds at this time for this  9. Acute idiopathic gout of multiple sites Had flare up about 2  Months ago, usually doe snot last more then a day.  10. BMI 32.0-32.9,adult No recent weight chnageccws    Outpatient Encounter Medications as of 11/21/2018  Medication Sig  . aspirin EC 81 MG tablet Take 1 tablet (81 mg total) by mouth daily.  . Cholecalciferol (VITAMIN D) 2000 UNITS CAPS Take by mouth daily.  . cloNIDine (CATAPRES) 0.1 MG tablet Take 1 tablet by mouth twice daily  . EUTHYROX 88 MCG tablet Take 1 tablet by mouth once daily  . Fe Fum-FA-B Cmp-C-Zn-Mg-Mn-Cu (HEMOCYTE PLUS) 106-1 MG CAPS Take 1 tablet by mouth at bedtime. (Patient not taking:  Reported on 10/27/2018)  . FIBER PO Take 2 each by mouth daily.   . fish oil-omega-3 fatty acids 1000 MG capsule Take 1 g by mouth daily.  . furosemide (LASIX) 20 MG tablet Take 1 tablet (20 mg total) by mouth daily.  . Multiple Vitamins-Minerals (ICAPS PO) Take 1 tablet by mouth daily.   .Marland Kitchenolmesartan (BENICAR) 40 MG tablet Take 1 tablet (40 mg total) by mouth daily.  .Marland Kitchenomeprazole (PRILOSEC) 20 MG capsule Take 1 capsule (20 mg total) by mouth daily.  . tamoxifen (NOLVADEX) 20 MG tablet TAKE 1 TABLET BY MOUTH ONCE DAILY  . vitamin C (ASCORBIC ACID) 500 MG tablet Take 500 mg by mouth daily.     Past Surgical History:  Procedure Laterality Date  . ABDOMINAL HYSTERECTOMY    . BREAST LUMPECTOMY WITH RADIOACTIVE SEED LOCALIZATION Left 10/29/2016   Procedure: LEFT BREAST LUMPECTOMY WITH RADIOACTIVE SEED LOCALIZATION;  Surgeon: HExcell Seltzer MD;  [place] [facility]  Service: General;  Laterality: Left;  . CAROTID BODY TUMOR EXCISION    . COLONOSCOPY W/ POLYPECTOMY    . EYE SURGERY Bilateral    cataract removed  . JOINT REPLACEMENT Right 2006   knee  . MASTECTOMY Left 2018  . PAROTIDECTOMY Left   . RE-EXCISION OF BREAST LUMPECTOMY Left 11/05/2016   Procedure: RE-EXCISION OF LEFT BREAST LUMPECTOMY;  Surgeon: HExcell Seltzer MD;  [place] [facility]  Service: General;  Laterality: Left;  . TOTAL KNEE ARTHROPLASTY Left  03/13/2014   Procedure: LEFT TOTAL KNEE ARTHROPLASTY;  Surgeon: Kerin Salen, MD;  Location: [facility];  Service: Orthopedics;  Laterality: Left;  . TOTAL MASTECTOMY Left 11/12/2016   Procedure: LEFT TOTAL MASTECTOMY;  Surgeon: Excell Seltzer, MD;  Location: [place];  Service: General;  Laterality: Left;    Family History  Problem Relation Age of Onset  . Heart disease Mother   . Emphysema Father   . Cancer Sister        ovarian    New complaints: None today  Social history: Knee is getting better from fall 3  weeks ago.  Controlled substance contract: N/A     Review of Systems  Constitutional: Negative for activity change and appetite change.  HENT: Negative.   Eyes: Negative for pain.  Respiratory: Negative for shortness of breath.   Cardiovascular: Positive for leg swelling. Negative for chest pain and palpitations.  Gastrointestinal: Negative for abdominal pain.  Endocrine: Negative for polydipsia.  Genitourinary: Negative.   Skin: Negative for rash.  Neurological: Negative for dizziness, weakness and headaches.  Hematological: Does not bruise/bleed easily.  Psychiatric/Behavioral: Negative.   All other systems reviewed and are negative.      Objective:   Physical Exam Vitals signs and nursing note reviewed.  Constitutional:      General: She is not in acute distress.    Appearance: Normal appearance. She is well-developed.  HENT:     Head: Normocephalic.     Nose: Nose normal.  Eyes:     Pupils: Pupils are equal, round, and reactive to light.  Neck:     Musculoskeletal: Normal range of motion and neck supple.     Vascular: No carotid bruit or JVD.  Cardiovascular:     Rate and Rhythm: Normal rate and regular rhythm.     Heart sounds: Normal heart sounds.  Pulmonary:     Effort: Pulmonary effort is normal. No respiratory distress.     Breath sounds: Normal breath sounds. No wheezing or rales.  Chest:     Chest wall: No tenderness.  Abdominal:     General: Bowel sounds are normal. There is no distension or abdominal bruit.     Palpations: Abdomen is soft. There is no hepatomegaly, splenomegaly, mass or pulsatile mass.     Tenderness: There is no abdominal tenderness.  Musculoskeletal: Normal range of motion.     Right lower leg: Edema (2+) present.     Left lower leg: Edema (1+) present.  Lymphadenopathy:     Cervical: No cervical adenopathy.  Skin:    General: Skin is warm and dry.  Neurological:     Mental Status: She is alert and oriented to person, place,  and time.     Deep Tendon Reflexes: Reflexes are normal and symmetric.  Psychiatric:        Behavior: Behavior normal.        Thought Content: Thought content normal.        Judgment: Judgment normal.    BP (!) 142/88 (BP Location: Left Arm, Cuff Size: Large)   Pulse (!) 52   Temp 98.2 F (36.8 C) (Oral)   Ht '5\' 1"'$  (1.549 m)   Wt 179 lb 12.8 oz (81.6 kg)   BMI 33.97 kg/m         Assessment & Plan:  JADA FASS comes in today with chief complaint of Medical Management of Chronic Issues   Diagnosis and orders addressed:  1. Essential hypertension, benign Low sodium  diet - CMP14+EGFR - Lipid panel - cloNIDine (CATAPRES) 0.1 MG tablet; Take 1 tablet (0.1 mg total) by mouth 2 (two) times daily.  Dispense: 180 tablet; Refill: 1 - olmesartan (BENICAR) 40 MG tablet; Take 1 tablet (40 mg total) by mouth daily.  Dispense: 90 tablet; Refill: 1  2. Gastroesophageal reflux disease without esophagitis Avoid spicy foods Do not eat 2 hours prior to bedtime - omeprazole (PRILOSEC) 20 MG capsule; Take 1 capsule (20 mg total) by mouth daily.  Dispense: 90 capsule; Refill: 1  3. Diverticulosis of colon Continue to watch diet  4. Hypothyroidism due to acquired atrophy of thyroid - Thyroid Panel With TSH  5. Age-related osteoporosis without current pathological fracture Weight bearing exercise when can tolerate  6. CKD (chronic kidney disease), stage III ([place]) Labs pending  7. Iron deficiency anemia, unspecified iron deficiency anemia type Back on iron supplements once have been on miralax for a week - Anemia Profile B  8. GAD (generalized anxiety disorder) Stress management  9. Acute idiopathic gout of multiple sites Low purine diet  10. BMI 32.0-32.9,adult Discussed diet and exercise for person with BMI >25 Will recheck weight in 3-6 months  11. Constipation, unspecified constipation type miralax daily in apple juice  12. Peripheral edema Elevate legs when sitting  Compression socks may help - furosemide (LASIX) 20 MG tablet; Take 1 tablet (20 mg total) by mouth daily.  Dispense: 90 tablet; Refill: 1   Labs pending Health Maintenance reviewed Diet and exercise encouraged  Follow up plan: 6 months   [name], FNP

## 2018-11-21 NOTE — Telephone Encounter (Signed)
Patient aware.

## 2018-11-21 NOTE — Telephone Encounter (Signed)
miralax is OTC

## 2018-11-22 LAB — CMP14+EGFR
ALT: 8 IU/L (ref 0–32)
AST: 13 IU/L (ref 0–40)
Albumin/Globulin Ratio: 1.5 (ref 1.2–2.2)
Albumin: 3.9 g/dL (ref 3.6–4.6)
Alkaline Phosphatase: 82 IU/L (ref 39–117)
BUN/Creatinine Ratio: 14 (ref 12–28)
BUN: 23 mg/dL (ref 8–27)
Bilirubin Total: 0.3 mg/dL (ref 0.0–1.2)
CO2: 25 mmol/L (ref 20–29)
Calcium: 9.6 mg/dL (ref 8.7–10.3)
Chloride: 102 mmol/L (ref 96–106)
Creatinine, Ser: 1.63 mg/dL — ABNORMAL HIGH (ref 0.57–1.00)
GFR calc Af Amer: 33 mL/min/{1.73_m2} — ABNORMAL LOW (ref >59)
GFR calc non Af Amer: 29 mL/min/{1.73_m2} — ABNORMAL LOW (ref >59)
Globulin, Total: 2.6 g/dL (ref 1.5–4.5)
Glucose: 101 mg/dL — ABNORMAL HIGH (ref 65–99)
Potassium: 4.4 mmol/L (ref 3.5–5.2)
Sodium: 140 mmol/L (ref 134–144)
Total Protein: 6.5 g/dL (ref 6.0–8.5)

## 2018-11-22 LAB — THYROID PANEL WITH TSH
Free Thyroxine Index: 2.9 (ref 1.2–4.9)
T3 Uptake Ratio: 26 % (ref 24–39)
T4, Total: 11 ug/dL (ref 4.5–12.0)
TSH: 3.86 u[IU]/mL (ref 0.450–4.500)

## 2018-11-22 LAB — ANEMIA PROFILE B
Basophils Absolute: 0.1 10*3/uL (ref 0.0–0.2)
Basos: 1 %
EOS (ABSOLUTE): 0.2 10*3/uL (ref 0.0–0.4)
Eos: 3 %
Ferritin: 218 ng/mL — ABNORMAL HIGH (ref 15–150)
Folate: 13.4 ng/mL (ref >3.0)
Hematocrit: 32.2 % — ABNORMAL LOW (ref 34.0–46.6)
Hemoglobin: 10.5 g/dL — ABNORMAL LOW (ref 11.1–15.9)
Immature Grans (Abs): 0 10*3/uL (ref 0.0–0.1)
Immature Granulocytes: 0 %
Iron Saturation: 30 % (ref 15–55)
Iron: 85 ug/dL (ref 27–139)
Lymphocytes Absolute: 1.5 10*3/uL (ref 0.7–3.1)
Lymphs: 22 %
MCH: 31.5 pg (ref 26.6–33.0)
MCHC: 32.6 g/dL (ref 31.5–35.7)
MCV: 97 fL (ref 79–97)
Monocytes Absolute: 0.5 10*3/uL (ref 0.1–0.9)
Monocytes: 7 %
Neutrophils Absolute: 4.5 10*3/uL (ref 1.4–7.0)
Neutrophils: 67 %
Platelets: 230 10*3/uL (ref 150–450)
RBC: 3.33 x10E6/uL — ABNORMAL LOW (ref 3.77–5.28)
RDW: 12.1 % (ref 11.7–15.4)
Retic Ct Pct: 1.5 % (ref 0.6–2.6)
Total Iron Binding Capacity: 280 ug/dL (ref 250–450)
UIBC: 195 ug/dL (ref 118–369)
Vitamin B-12: 694 pg/mL (ref 232–1245)
WBC: 6.8 10*3/uL (ref 3.4–10.8)

## 2018-11-22 LAB — LIPID PANEL
Chol/HDL Ratio: 2.1 ratio (ref 0.0–4.4)
Cholesterol, Total: 141 mg/dL (ref 100–199)
HDL: 66 mg/dL (ref >39)
LDL Calculated: 59 mg/dL (ref 0–99)
Triglycerides: 79 mg/dL (ref 0–149)
VLDL Cholesterol Cal: 16 mg/dL (ref 5–40)

## 2018-11-23 ENCOUNTER — Telehealth: Payer: Self-pay | Admitting: Nurse Practitioner

## 2018-11-23 NOTE — Telephone Encounter (Signed)
Printed labs and put in mail

## 2018-12-12 ENCOUNTER — Telehealth: Payer: Self-pay | Admitting: Nurse Practitioner

## 2018-12-12 DIAGNOSIS — D500 Iron deficiency anemia secondary to blood loss (chronic): Secondary | ICD-10-CM

## 2018-12-12 NOTE — Telephone Encounter (Signed)
Referral to gi done

## 2018-12-12 NOTE — Telephone Encounter (Signed)
Patient aware.

## 2018-12-13 ENCOUNTER — Ambulatory Visit (INDEPENDENT_AMBULATORY_CARE_PROVIDER_SITE_OTHER): Payer: Medicare Other | Admitting: *Deleted

## 2018-12-13 VITALS — Ht 61.0 in | Wt 179.8 lb

## 2018-12-13 DIAGNOSIS — Z0000 Encounter for general adult medical examination without abnormal findings: Secondary | ICD-10-CM | POA: Diagnosis not present

## 2018-12-13 NOTE — Progress Notes (Signed)
MEDICARE ANNUAL WELLNESS VISIT  12/13/2018  Telephone Visit Disclaimer This Medicare AWV was conducted by telephone due to national recommendations for restrictions regarding the COVID-19 Pandemic (e.g. social distancing).  I verified, using two identifiers, that I am speaking with Darlene Gregory or their authorized healthcare agent. I discussed the limitations, risks, security, and privacy concerns of performing an evaluation and management service by telephone and the potential availability of an in-person appointment in the future. The patient expressed understanding and agreed to proceed.   Subjective:  Darlene Gregory is a 85 y.o. female patient of Chevis Pretty, [place] who had a Medicare Annual Wellness Visit today via telephone. [name] is Retired and lives with their spouse. she has 2 children. she reports that she is socially active and does interact with friends/family regularly. she is minimally physically active and enjoys cooking.  Patient Care Team: Chevis Pretty, FNP as PCP - General (Nurse Practitioner) Excell Seltzer, MD as Consulting Physician (General Surgery) Magrinat, Virgie Dad, MD as Consulting Physician (Oncology) Eppie Gibson, MD as Attending Physician (Radiation Oncology) Arvella Nigh, MD as Consulting Physician (Obstetrics and Gynecology)  Advanced Directives 12/13/2018 10/17/2017 11/12/2016 11/09/2016 11/05/2016 11/03/2016 10/25/2016  Does Patient Have a Medical Advance Directive? No Yes Yes Yes No No No  Type of Advance Directive - [place];Living will [place];Living will - - - -  Does patient want to make changes to medical advance directive? - No - Patient declined No - Patient declined - - - -  Copy of [place] in Chart? - No - copy requested No - copy requested No - copy requested - - -  Would patient like information on creating a medical advance directive? Yes (MAU/Ambulatory/Procedural Areas -  Information given) - No - Patient declined - No - Patient declined - [facility] Utilization Over the Past 12 Months: # of hospitalizations or ER visits: 0 # of surgeries: 0  Review of Systems    Patient reports that her overall health is unchanged compared to last year.  Patient Reported Readings (BP, Pulse, CBG, Weight, etc) none  Review of Systems: History obtained from chart review and the patient General ROS: negative  All other systems negative.  Pain Assessment Pain : No/denies pain     Current Medications & Allergies (verified) Allergies as of 12/13/2018      Reactions   Hyoscyamine Sulfate    REACTION: rash   Norvasc [amlodipine Besylate] Swelling   Pyridium  [phenazopyridine Hcl]    Phenazopyridine Hcl Rash   Sulfonamide Derivatives Rash      Medication List       Accurate as of December 13, 2018  8:48 AM. If you have any questions, ask your nurse or doctor.        aspirin EC 81 MG tablet Take 1 tablet (81 mg total) by mouth daily.   cloNIDine 0.1 MG tablet Commonly known as: CATAPRES Take 1 tablet (0.1 mg total) by mouth 2 (two) times daily.   Euthyrox 88 MCG tablet Generic drug: levothyroxine Take 1 tablet by mouth once daily   FIBER PO Take 2 each by mouth daily.   fish oil-omega-3 fatty acids 1000 MG capsule Take 1 g by mouth daily.   furosemide 20 MG tablet Commonly known as: LASIX Take 1 tablet (20 mg total) by mouth daily.   Hemocyte Plus 106-1 MG Caps Take 1 tablet by mouth at bedtime.   ICAPS PO  Take 1 tablet by mouth daily.   olmesartan 40 MG tablet Commonly known as: Benicar Take 1 tablet (40 mg total) by mouth daily.   omeprazole 20 MG capsule Commonly known as: PRILOSEC Take 1 capsule (20 mg total) by mouth daily.   tamoxifen 20 MG tablet Commonly known as: NOLVADEX TAKE 1 TABLET BY MOUTH ONCE DAILY   vitamin C 500 MG tablet Commonly known as: ASCORBIC ACID Take 500 mg by mouth daily.   Vitamin D 50 MCG (2000 UT)  Caps Take by mouth daily.       History (reviewed): Past Medical History:  Diagnosis Date  . Cancer ([place]) 10/2016   left breast cancer  . Difficult intubation    "small trachea"; 01/18/05: glidescope but unable to pass stylet -->fast track LMA with blind passage for ETT, consider awake intubation; 08/14/08: IV induction and DL with glidesecope for ETT  . DJD (degenerative joint disease)   . Frequent urination   . GERD (gastroesophageal reflux disease)   . Hypertension   . Hypothyroidism   . Osteoporosis   . PONV (postoperative nausea and vomiting)   . Thyroid disease    Past Surgical History:  Procedure Laterality Date  . ABDOMINAL HYSTERECTOMY    . BREAST LUMPECTOMY WITH RADIOACTIVE SEED LOCALIZATION Left 10/29/2016   Procedure: LEFT BREAST LUMPECTOMY WITH RADIOACTIVE SEED LOCALIZATION;  Surgeon: Excell Seltzer, MD;  Location: [place];  Service: General;  Laterality: Left;  . CAROTID BODY TUMOR EXCISION    . COLONOSCOPY W/ POLYPECTOMY    . EYE SURGERY Bilateral    cataract removed  . JOINT REPLACEMENT Right 2006   knee  . MASTECTOMY Left 2018  . PAROTIDECTOMY Left   . RE-EXCISION OF BREAST LUMPECTOMY Left 11/05/2016   Procedure: RE-EXCISION OF LEFT BREAST LUMPECTOMY;  Surgeon: Excell Seltzer, MD;  Location: [place];  Service: General;  Laterality: Left;  . TOTAL KNEE ARTHROPLASTY Left 03/13/2014   Procedure: LEFT TOTAL KNEE ARTHROPLASTY;  Surgeon: Kerin Salen, MD;  Location: [place];  Service: Orthopedics;  Laterality: Left;  . TOTAL MASTECTOMY Left 11/12/2016   Procedure: LEFT TOTAL MASTECTOMY;  Surgeon: Excell Seltzer, MD;  Location: [place];  Service: General;  Laterality: Left;   Family History  Problem Relation Age of Onset  . Heart disease Mother   . Emphysema Father   . Cancer Sister        ovarian   Social History   Socioeconomic History  . Marital status: Married    Spouse name: [name]  . Number  of children: 2  . Years of education: 43  . Highest education level: 12th grade  Occupational History  . 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 Financial resource strain: Not hard at all  . Food insecurity    Worry: Never true    Inability: Never true  . Transportation needs    Medical: No    Non-medical: No  Tobacco Use  . Smoking status: Former Research scientist (life sciences)  . Smokeless tobacco: Never Used  Substance and Sexual Activity  . Alcohol use: No  . Drug use: No  . Sexual activity: Not Currently  Lifestyle  . Physical activity    Days per week: 3 days    Minutes per session: 60 min  . Stress: Only a little  Relationships  . Social connections    Talks on phone: More than three times a week    Gets together: More  than three times a week    Attends religious service: More than 4 times per year    Active member of club or organization: Yes    Attends meetings of clubs or organizations: More than 4 times per year    Relationship status: Married  Other Topics Concern  . Not on file  Social History Narrative  . Not on file    Activities of Daily Living In your present state of health, do you have any difficulty performing the following activities: 12/13/2018  Hearing? N  Vision? N  Difficulty concentrating or making decisions? N  Walking or climbing stairs? N  Dressing or bathing? N  Doing errands, shopping? N  Preparing Food and eating ? N  Using the Toilet? N  In the past six months, have you accidently leaked urine? N  Do you have problems with loss of bowel control? N  Managing your Medications? N  Managing your Finances? N  Housekeeping or managing your Housekeeping? N  Some recent data might be hidden    Patient Education/ Literacy How often do you need to have someone help you when you read instructions, pamphlets, or other written materials from your doctor or pharmacy?: 1 - Never What is the last grade level you completed in school?: 12th  grade  Exercise Current Exercise Habits: The patient does not participate in regular exercise at present  Diet Patient reports consuming 3 meals a day and 0 snack(s) a day Patient reports that her primary diet is: Regular Patient reports that she does have regular access to food.   Depression Screen PHQ 2/9 Scores 12/13/2018 11/21/2018 10/27/2018 03/23/2018 12/09/2017 10/17/2017 08/23/2017  PHQ - 2 Score 0 0 0 0 0 0 0  PHQ- 9 Score 0 - 0 - - - -     Fall Risk Fall Risk  12/13/2018 11/21/2018 10/27/2018 03/23/2018 12/09/2017  Falls in the past year? 1 1 1  0 No  Number falls in past yr: 1 1 0 - -  Injury with Fall? 1 1 1  - -  Comment - Knee laceration and hairline fracture. - - -  Risk for fall due to : History of fall(s) - - - -     Objective:  Darlene Gregory seemed alert and oriented and she participated appropriately during our telephone visit.  Blood Pressure Weight BMI  BP Readings from Last 3 Encounters:  11/21/18 (!) 142/88  10/27/18 138/73  03/23/18 (!) 143/71   Wt Readings from Last 3 Encounters:  12/13/18 179 lb 12.8 oz (81.6 kg)  11/21/18 179 lb 12.8 oz (81.6 kg)  10/27/18 183 lb (83 kg)   BMI Readings from Last 1 Encounters:  12/13/18 33.97 kg/m    *Unable to obtain current vital signs, weight, and BMI due to telephone visit type  Hearing/Vision  . [place] did not seem to have difficulty with hearing/understanding during the telephone conversation . Reports that she has not had a formal eye exam by an eye care professional within the past year . Reports that she has not had a formal hearing evaluation within the past year *Unable to fully assess hearing and vision during telephone visit type  Cognitive Function: 6CIT Screen 12/13/2018  What Year? 0 points  What month? 0 points  What time? 0 points  Count back from 20 0 points  Months in reverse 0 points  Repeat phrase 0 points  Total Score 0   (Normal:0-7, Significant for Dysfunction: >8)  Normal Cognitive Function  Screening: Yes  Immunization & Health Maintenance Record Immunization History  Administered Date(s) Administered  . Pneumococcal Conjugate-13 02/13/2014  . Pneumococcal Polysaccharide-23 03/31/2010  . Tdap 11/05/2013  . Zoster Recombinat (Shingrix) 09/28/2016    Health Maintenance  Topic Date Due  . Samul Dada  11/06/2023  . DEXA SCAN  Completed  . PNA vac Low Risk Adult  Completed  . MAMMOGRAM  Discontinued  . COLONOSCOPY  Discontinued       Assessment  This is a routine wellness examination for Darlene Gregory.  Health Maintenance: Due or Overdue There are no preventive care reminders to display for this patient.  Darlene Gregory does not need a referral for [employer] Assistance: Care Management:   no Social Work:    no Prescription Assistance:  no Nutrition/Diabetes Education:  no   Plan:  Personalized Goals Goals Addressed   None    Personalized Health Maintenance & Screening Recommendations  Advanced directives: has NO advanced directive  - add't info requested. Referral to SW: no  Lung Cancer Screening Recommended: no (Low Dose CT Chest recommended if Age 62-80 years, 30 pack-year currently smoking OR have quit w/in past 15 years) Hepatitis C Screening recommended: no HIV Screening recommended: no  Advanced Directives: Written information was prepared per patient's request.  Referrals & Orders No orders of the defined types were placed in this encounter.   Follow-up Plan . Follow-up with Chevis Pretty, FNP as planned   I have personally reviewed and noted the following in the patient's chart:   . Medical and social history . Use of alcohol, tobacco or illicit drugs  . Current medications and supplements . Functional ability and status . Nutritional status . Physical activity . Advanced directives . List of other physicians . Hospitalizations, surgeries, and ER visits in previous 12 months . Vitals . Screenings to include cognitive,  depression, and falls . Referrals and appointments  In addition, I have reviewed and discussed with Darlene Gregory certain preventive protocols, quality metrics, and best practice recommendations. A written personalized care plan for preventive services as well as general preventive health recommendations is available and can be mailed to the patient at her request.      Wardell Heath, LPN  [id number]

## 2018-12-13 NOTE — Patient Instructions (Signed)
Darlene Gregory , Thank you for taking time to come for your Medicare Wellness Visit. I appreciate your ongoing commitment to your health goals. Please review the following plan we discussed and let me know if I can assist you in the future.   These are the goals we discussed: Goals     DIET - INCREASE WATER INTAKE     Drink 1.5 to 2 quarts of water a day      Exercise 150 min/wk Moderate Activity       This is a list of the screening recommended for you and due dates:  Health Maintenance  Topic Date Due   Tetanus Vaccine  11/06/2023   DEXA scan (bone density measurement)  Completed   Pneumonia vaccines  Completed   Mammogram  Discontinued   Colon Cancer Screening  Discontinued     Advance Directive  Advance directives are legal documents that let you make choices ahead of time about your health care and medical treatment in case you become unable to communicate for yourself. Advance directives are a way for you to communicate your wishes to family, friends, and health care providers. This can help convey your decisions about end-of-life care if you become unable to communicate. Discussing and writing advance directives should happen over time rather than all at once. Advance directives can be changed depending on your situation and what you want, even after you have signed the advance directives. If you do not have an advance directive, some states assign family decision makers to act on your behalf based on how closely you are related to them. Each state has its own laws regarding advance directives. You may want to check with your health care provider, attorney, or state representative about the laws in your state. There are different types of advance directives, such as:  Medical power of attorney.  Living will.  Do not resuscitate (DNR) or do not attempt resuscitation (DNAR) order. Health care proxy and medical power of attorney A health care proxy, also called a health care  agent, is a person who is appointed to make medical decisions for you in cases in which you are unable to make the decisions yourself. Generally, people choose someone they know well and trust to represent their preferences. Make sure to ask this person for an agreement to act as your proxy. A proxy may have to exercise judgment in the event of a medical decision for which your wishes are not known. A medical power of attorney is a legal document that names your health care proxy. Depending on the laws in your state, after the document is written, it may also need to be:  Signed.  Notarized.  Dated.  Copied.  Witnessed.  Incorporated into your medical record. You may also want to appoint someone to manage your financial affairs in a situation in which you are unable to do so. This is called a durable power of attorney for finances. It is a separate legal document from the durable power of attorney for health care. You may choose the same person or someone different from your health care proxy to act as your agent in financial matters. If you do not appoint a proxy, or if there is a concern that the proxy is not acting in your best interests, a court-appointed guardian may be designated to act on your behalf. Living will A living will is a set of instructions documenting your wishes about medical care when you cannot express them yourself. Health  care providers should keep a copy of your living will in your medical record. You may want to give a copy to family members or friends. To alert caregivers in case of an emergency, you can place a card in your wallet to let them know that you have a living will and where they can find it. A living will is used if you become:  Terminally ill.  Incapacitated.  Unable to communicate or make decisions. Items to consider in your living will include:  The use or non-use of life-sustaining equipment, such as dialysis machines and breathing machines  (ventilators).  A DNR or DNAR order, which is the instruction not to use cardiopulmonary resuscitation (CPR) if breathing or heartbeat stops.  The use or non-use of tube feeding.  Withholding of food and fluids.  Comfort (palliative) care when the goal becomes comfort rather than a cure.  Organ and tissue donation. A living will does not give instructions for distributing your money and property if you should pass away. It is recommended that you seek the advice of a lawyer when writing a will. Decisions about taxes, beneficiaries, and asset distribution will be legally binding. This process can relieve your family and friends of any concerns surrounding disputes or questions that may come up about the distribution of your assets. DNR or DNAR A DNR or DNAR order is a request not to have CPR in the event that your heart stops beating or you stop breathing. If a DNR or DNAR order has not been made and shared, a health care provider will try to help any patient whose heart has stopped or who has stopped breathing. If you plan to have surgery, talk with your health care provider about how your DNR or DNAR order will be followed if problems occur. Summary  Advance directives are the legal documents that allow you to make choices ahead of time about your health care and medical treatment in case you become unable to communicate for yourself.  The process of discussing and writing advance directives should happen over time. You can change the advance directives, even after you have signed them.  Advance directives include DNR or DNAR orders, living wills, and designating an agent as your medical power of attorney. This information is not intended to replace advice given to you by your health care provider. Make sure you discuss any questions you have with your health care provider. Document Released: 08/03/2007 Document Revised: 05/31/2018 Document Reviewed: 03/15/2016 Elsevier Patient Education   [place].    BMI for Adults  Body mass index (BMI) is a number that is calculated from a person's weight and height. BMI may help to estimate how much of a person's weight is composed of fat. BMI can help identify those who may be at higher risk for certain medical problems. How is BMI used with adults? BMI is used as a screening tool to identify possible weight problems. It is used to check whether a person is obese, overweight, healthy weight, or underweight. How is BMI calculated? BMI measures your weight and compares it to your height. This can be done either in [place] ([place] or metric measurements. Note that charts are available to help you find your BMI quickly and easily without having to do these calculations yourself. To calculate your BMI in [place] ([place] measurements, your health care provider will: 1. Measure your weight in pounds (lb). 2. Multiply the number of pounds by 703. ? For example, for a person who weighs  180 lb, multiply that number by 703, which equals 126,540. 3. Measure your height in inches (in). Then multiply that number by itself to get a measurement called "inches squared." ? For example, for a person who is 70 in tall, the "inches squared" measurement is 70 in x 70 in, which equals 4900 inches squared. 4. Divide the total from Step 2 (number of lb x 703) by the total from Step 3 (inches squared): 126,540  4900 = 25.8. This is your BMI. To calculate your BMI in metric measurements, your health care provider will: 1. Measure your weight in kilograms (kg). 2. Measure your height in meters (m). Then multiply that number by itself to get a measurement called "meters squared." ? For example, for a person who is 1.75 m tall, the "meters squared" measurement is 1.75 m x 1.75 m, which is equal to 3.1 meters squared. 3. Divide the number of kilograms (your weight) by the meters squared number. In this example: 70  3.1 = 22.6. This is your BMI. How is BMI  interpreted? To interpret your results, your health care provider will use BMI charts to identify whether you are underweight, normal weight, overweight, or obese. The following guidelines will be used:  Underweight: BMI less than 18.5.  Normal weight: BMI between 18.5 and 24.9.  Overweight: BMI between 25 and 29.9.  Obese: BMI of 30 and above. Please note:  Weight includes both fat and muscle, so someone with a muscular build, such as an athlete, may have a BMI that is higher than 24.9. In cases like these, BMI is not an accurate measure of body fat.  To determine if excess body fat is the cause of a BMI of 25 or higher, further assessments may need to be done by a health care provider.  BMI is usually interpreted in the same way for men and women. Why is BMI a useful tool? BMI is useful in two ways:  Identifying a weight problem that may be related to a medical condition, or that may increase the risk for medical problems.  Promoting lifestyle and diet changes in order to reach a healthy weight. Summary  Body mass index (BMI) is a number that is calculated from a person's weight and height.  BMI may help to estimate how much of a person's weight is composed of fat. BMI can help identify those who may be at higher risk for certain medical problems.  BMI can be measured using English measurements or metric measurements.  To interpret your results, your health care provider will use BMI charts to identify whether you are underweight, normal weight, overweight, or obese. This information is not intended to replace advice given to you by your health care provider. Make sure you discuss any questions you have with your health care provider. Document Released: 01/06/2004 Document Revised: 04/08/2017 Document Reviewed: 03/09/2017 Elsevier Patient Education  2020 [employer].

## 2019-02-14 DIAGNOSIS — Z0289 Encounter for other administrative examinations: Secondary | ICD-10-CM

## 2019-02-20 ENCOUNTER — Other Ambulatory Visit: Payer: Self-pay | Admitting: *Deleted

## 2019-02-20 ENCOUNTER — Ambulatory Visit (INDEPENDENT_AMBULATORY_CARE_PROVIDER_SITE_OTHER): Payer: Medicare Other

## 2019-02-20 ENCOUNTER — Other Ambulatory Visit: Payer: Self-pay

## 2019-02-20 ENCOUNTER — Other Ambulatory Visit: Payer: Self-pay | Admitting: Nurse Practitioner

## 2019-02-20 ENCOUNTER — Ambulatory Visit: Payer: Medicare Other | Admitting: Physician Assistant

## 2019-02-20 ENCOUNTER — Encounter: Payer: Self-pay | Admitting: Physician Assistant

## 2019-02-20 VITALS — BP 147/75 | HR 69 | Temp 97.7°F | Ht 61.0 in | Wt 178.8 lb

## 2019-02-20 DIAGNOSIS — R0781 Pleurodynia: Secondary | ICD-10-CM

## 2019-02-20 DIAGNOSIS — R109 Unspecified abdominal pain: Secondary | ICD-10-CM

## 2019-02-20 MED ORDER — TAMOXIFEN CITRATE 20 MG PO TABS: 20.0000 mg | ORAL_TABLET | Freq: Every day | ORAL | 2 refills | Status: DC

## 2019-02-22 ENCOUNTER — Encounter: Payer: Self-pay | Admitting: Physician Assistant

## 2019-02-22 NOTE — Progress Notes (Signed)
BP (!) 147/75   Pulse 69   Temp 97.7 F (36.5 C) (Temporal)   Ht 5\' 1"  (1.549 m)   Wt 178 lb 12.8 oz (81.1 kg)   SpO2 100%   BMI 33.78 kg/m    Subjective:    Patient ID: Darlene Gregory, female    DOB: [birth year], 85 y.o.   MRN: [id number]  HPI: Darlene Gregory is a 85 y.o. female presenting on 02/20/2019 for Flank Pain This patient comes in because she had been having a little bit of pain in her right ribs.  She fell a few weeks ago and has continued to be sore.  She has had a history of getting urinary tract infections in the past.  We were unable to have a test run today.  She will let us know if things do not get better.  We will have x-rays performed today.   Past Medical History:  Diagnosis Date  . Cancer ([place]) 10/2016   left breast cancer  . Difficult intubation    "small trachea"; 01/18/05: glidescope but unable to pass stylet -->fast track LMA with blind passage for ETT, consider awake intubation; 08/14/08: IV induction and DL with glidesecope for ETT  . DJD (degenerative joint disease)   . Frequent urination   . GERD (gastroesophageal reflux disease)   . Hypertension   . Hypothyroidism   . Osteoporosis   . PONV (postoperative nausea and vomiting)   . Thyroid disease    Relevant past medical, surgical, family and social history reviewed and updated as indicated. Interim medical history since our last visit reviewed. Allergies and medications reviewed and updated. DATA REVIEWED: CHART IN EPIC  Family History reviewed for pertinent findings.  Review of Systems  Constitutional: Negative.   HENT: Negative.   Eyes: Negative.   Respiratory: Negative.   Gastrointestinal: Negative.   Genitourinary: Negative.   Musculoskeletal: Positive for myalgias.    Allergies as of 02/20/2019      Reactions   Hyoscyamine Sulfate    REACTION: rash   Norvasc [amlodipine Besylate] Swelling   Pyridium  [phenazopyridine Hcl]    Phenazopyridine Hcl Rash   Sulfonamide Derivatives  Rash      Medication List       Accurate as of February 20, 2019 11:59 PM. If you have any questions, ask your nurse or doctor.        aspirin EC 81 MG tablet Take 1 tablet (81 mg total) by mouth daily.   cloNIDine 0.1 MG tablet Commonly known as: CATAPRES Take 1 tablet (0.1 mg total) by mouth 2 (two) times daily.   Euthyrox 88 MCG tablet Generic drug: levothyroxine Take 1 tablet by mouth once daily   FIBER PO Take 2 each by mouth daily.   fish oil-omega-3 fatty acids 1000 MG capsule Take 1 g by mouth daily.   furosemide 20 MG tablet Commonly known as: LASIX Take 1 tablet (20 mg total) by mouth daily.   Hemocyte Plus 106-1 MG Caps Take 1 tablet by mouth at bedtime.   ICAPS PO Take 1 tablet by mouth daily.   olmesartan 40 MG tablet Commonly known as: Benicar Take 1 tablet (40 mg total) by mouth daily.   omeprazole 20 MG capsule Commonly known as: PRILOSEC Take 1 capsule (20 mg total) by mouth daily.   tamoxifen 20 MG tablet Commonly known as: NOLVADEX Take 1 tablet (20 mg total) by mouth daily.   vitamin C 500 MG tablet Commonly known as:  ASCORBIC ACID Take 500 mg by mouth daily.   Vitamin D 50 MCG (2000 UT) Caps Take by mouth daily.          Objective:    BP (!) 147/75   Pulse 69   Temp 97.7 F (36.5 C) (Temporal)   Ht 5\' 1"  (1.549 m)   Wt 178 lb 12.8 oz (81.1 kg)   SpO2 100%   BMI 33.78 kg/m   Allergies  Allergen Reactions  . Hyoscyamine Sulfate     REACTION: rash  . Norvasc [Amlodipine Besylate] Swelling  . Pyridium  [Phenazopyridine Hcl]   . Phenazopyridine Hcl Rash  . Sulfonamide Derivatives Rash    Wt Readings from Last 3 Encounters:  02/20/19 178 lb 12.8 oz (81.1 kg)  12/13/18 179 lb 12.8 oz (81.6 kg)  11/21/18 179 lb 12.8 oz (81.6 kg)    Physical Exam Constitutional:      General: She is not in acute distress.    Appearance: Normal appearance. She is well-developed.  HENT:     Head: Normocephalic and atraumatic.   Cardiovascular:     Rate and Rhythm: Normal rate.  Pulmonary:     Effort: Pulmonary effort is normal.  Musculoskeletal:     Thoracic back: She exhibits tenderness, deformity and pain. She exhibits no spasm.       Back:  Skin:    General: Skin is warm and dry.     Findings: No rash.  Neurological:     Mental Status: She is alert and oriented to person, place, and time.     Deep Tendon Reflexes: Reflexes are normal and symmetric.         Assessment & Plan:   1. Flank pain - DG Ribs Unilateral W/Chest Right; Future  2. Rib pain on right side - DG Ribs Unilateral W/Chest Right; Future   Continue all other maintenance medications as listed above.  Follow up plan: No follow-ups on file.  Educational handout given for [place] PA-C Seltzer [address]  [place], [place] [address] [phone]   02/22/2019, 8:42 PM

## 2019-05-24 ENCOUNTER — Ambulatory Visit (INDEPENDENT_AMBULATORY_CARE_PROVIDER_SITE_OTHER): Payer: Medicare PPO | Admitting: Nurse Practitioner

## 2019-05-24 ENCOUNTER — Encounter: Payer: Self-pay | Admitting: Nurse Practitioner

## 2019-05-24 DIAGNOSIS — N1832 Chronic kidney disease, stage 3b: Secondary | ICD-10-CM

## 2019-05-24 DIAGNOSIS — Z6832 Body mass index (BMI) 32.0-32.9, adult: Secondary | ICD-10-CM

## 2019-05-24 DIAGNOSIS — K449 Diaphragmatic hernia without obstruction or gangrene: Secondary | ICD-10-CM

## 2019-05-24 DIAGNOSIS — D509 Iron deficiency anemia, unspecified: Secondary | ICD-10-CM

## 2019-05-24 DIAGNOSIS — R609 Edema, unspecified: Secondary | ICD-10-CM

## 2019-05-24 DIAGNOSIS — F411 Generalized anxiety disorder: Secondary | ICD-10-CM

## 2019-05-24 DIAGNOSIS — E034 Atrophy of thyroid (acquired): Secondary | ICD-10-CM | POA: Diagnosis not present

## 2019-05-24 DIAGNOSIS — K219 Gastro-esophageal reflux disease without esophagitis: Secondary | ICD-10-CM

## 2019-05-24 DIAGNOSIS — I10 Essential (primary) hypertension: Secondary | ICD-10-CM

## 2019-05-24 MED ORDER — CLONIDINE HCL 0.1 MG PO TABS: 0.1000 mg | ORAL_TABLET | Freq: Two times a day (BID) | ORAL | 1 refills | Status: DC

## 2019-05-24 MED ORDER — FUROSEMIDE 20 MG PO TABS: 20.0000 mg | ORAL_TABLET | Freq: Every day | ORAL | 1 refills | Status: DC

## 2019-05-24 MED ORDER — OLMESARTAN MEDOXOMIL 40 MG PO TABS: 40.0000 mg | ORAL_TABLET | Freq: Every day | ORAL | 1 refills | Status: DC

## 2019-05-24 MED ORDER — HEMOCYTE PLUS 106-1 MG PO CAPS: 1.0000 | ORAL_CAPSULE | Freq: Every day | ORAL | 1 refills | Status: DC

## 2019-05-24 MED ORDER — OMEPRAZOLE 20 MG PO CPDR: 20.0000 mg | DELAYED_RELEASE_CAPSULE | Freq: Every day | ORAL | 1 refills | Status: DC

## 2019-05-24 MED ORDER — LEVOTHYROXINE SODIUM 88 MCG PO TABS: 88.0000 ug | ORAL_TABLET | Freq: Every day | ORAL | 1 refills | Status: DC

## 2019-05-24 NOTE — Progress Notes (Signed)
Virtual Visit via telephone Note Due to COVID-19 pandemic this visit was conducted virtually. This visit type was conducted due to national recommendations for restrictions regarding the COVID-19 Pandemic (e.g. social distancing, sheltering in place) in an effort to limit this patient's exposure and mitigate transmission in our community. All issues noted in this document were discussed and addressed.  A physical exam was not performed with this format.  I connected with Darlene Gregory on 05/24/19 at 8:25 by telephone and verified that I am speaking with the correct person using two identifiers. Darlene Gregory is currently located at home and no one is currently with her during visit. The provider, [name], FNP is located in their office at time of visit.  I discussed the limitations, risks, security and privacy concerns of performing an evaluation and management service by telephone and the availability of in person appointments. I also discussed with the patient that there may be a patient responsible charge related to this service. The patient expressed understanding and agreed to proceed.   History and Present Illness:   Chief Complaint: Medical Management of Chronic Issues    HPI:  1. Essential hypertension, benign No c/o chest pain, sob or headache. Does  check blood pressure at home. BP Readings from Last 3 Encounters:  02/20/19 (!) 147/75  11/21/18 (!) 142/88  10/27/18 138/73     2. Diaphragmatic hernia without obstruction and without gangrene Only has issues when she over eats.  3. Gastroesophageal reflux disease without esophagitis Is on omeprazole and she says that works well. Has occasional heart burn  4. Hypothyroidism due to acquired atrophy of thyroid No problems that she is aware of Lab Results  Component Value Date   TSH 3.860 11/21/2018     5. Stage 3b chronic kidney disease Having no issues voiding. Lab Results  Component Value Date   CREATININE 1.63 (H) 11/21/2018     6. Iron deficiency anemia, unspecified iron deficiency anemia type She denies fatigue. Lab Results  Component Value Date   HGB 10.5 (L) 11/21/2018     7. GAD (generalized anxiety disorder) Says her anxiety is not really bad right now.  8. BMI 32.0-32.9,adult Wt Readings from Last 3 Encounters:  02/20/19 178 lb 12.8 oz (81.1 kg)  12/13/18 179 lb 12.8 oz (81.6 kg)  11/21/18 179 lb 12.8 oz (81.6 kg)   BMI Readings from Last 3 Encounters:  02/20/19 33.78 kg/m  12/13/18 33.97 kg/m  11/21/18 33.97 kg/m       Outpatient Encounter Medications as of 05/24/2019  Medication Sig  . aspirin EC 81 MG tablet Take 1 tablet (81 mg total) by mouth daily.  . Cholecalciferol (VITAMIN D) 2000 UNITS CAPS Take by mouth daily.  . cloNIDine (CATAPRES) 0.1 MG tablet Take 1 tablet (0.1 mg total) by mouth 2 (two) times daily.  Darlene Gregory 88 MCG tablet Take 1 tablet by mouth once daily  . Fe Fum-FA-B Cmp-C-Zn-Mg-Mn-Cu (HEMOCYTE PLUS) 106-1 MG CAPS Take 1 tablet by mouth at bedtime.  Darlene Gregory FIBER PO Take 2 each by mouth daily.   . fish oil-omega-3 fatty acids 1000 MG capsule Take 1 g by mouth daily.  . furosemide (LASIX) 20 MG tablet Take 1 tablet (20 mg total) by mouth daily.  . Multiple Vitamins-Minerals (ICAPS PO) Take 1 tablet by mouth daily.   Darlene Gregory olmesartan (BENICAR) 40 MG tablet Take 1 tablet (40 mg total) by mouth daily.  Darlene Gregory omeprazole (PRILOSEC) 20 MG capsule Take 1 capsule (20  mg total) by mouth daily.  . tamoxifen (NOLVADEX) 20 MG tablet Take 1 tablet (20 mg total) by mouth daily.  . vitamin C (ASCORBIC ACID) 500 MG tablet Take 500 mg by mouth daily.     Past Surgical History:  Procedure Laterality Date  . ABDOMINAL HYSTERECTOMY    . BREAST LUMPECTOMY WITH RADIOACTIVE SEED LOCALIZATION Left 10/29/2016   Procedure: LEFT BREAST LUMPECTOMY WITH RADIOACTIVE SEED LOCALIZATION;  Surgeon: Excell Seltzer, MD;  Location: [facility];  Service:  General;  Laterality: Left;  . CAROTID BODY TUMOR EXCISION    . COLONOSCOPY W/ POLYPECTOMY    . EYE SURGERY Bilateral    cataract removed  . JOINT REPLACEMENT Right 2006   knee  . MASTECTOMY Left 2018  . PAROTIDECTOMY Left   . RE-EXCISION OF BREAST LUMPECTOMY Left 11/05/2016   Procedure: RE-EXCISION OF LEFT BREAST LUMPECTOMY;  Surgeon: Excell Seltzer, MD;  Location: [facility];  Service: General;  Laterality: Left;  . TOTAL KNEE ARTHROPLASTY Left 03/13/2014   Procedure: LEFT TOTAL KNEE ARTHROPLASTY;  Surgeon: Kerin Salen, MD;  Location: [facility];  Service: Orthopedics;  Laterality: Left;  . TOTAL MASTECTOMY Left 11/12/2016   Procedure: LEFT TOTAL MASTECTOMY;  Surgeon: Excell Seltzer, MD;  Location: [facility];  Service: General;  Laterality: Left;    Family History  Problem Relation Age of Onset  . Heart disease Mother   . Emphysema Father   . Cancer Sister        ovarian    New complaints: None today  Social history: Lives with her husband  Controlled substance contract: n/a     Review of Systems  Constitutional: Negative for diaphoresis and weight loss.  Eyes: Negative for blurred vision, double vision and pain.  Respiratory: Negative for shortness of breath.   Cardiovascular: Negative for chest pain, palpitations, orthopnea and leg swelling.  Gastrointestinal: Negative for abdominal pain.  Skin: Negative for rash.  Neurological: Negative for dizziness, sensory change, loss of consciousness, weakness and headaches.  Endo/Heme/Allergies: Negative for polydipsia. Does not bruise/bleed easily.  Psychiatric/Behavioral: Negative for memory loss. The patient does not have insomnia.   All other systems reviewed and are negative.    Observations/Objective: Alert and oriented- answers all questions appropriately No distress B/P 166/67 patient reported- has not taken her medications yet.    Assessment and Plan: Darlene Gregory comes in  today with chief complaint of Medical Management of Chronic Issues   Diagnosis and orders addressed:  1. Essential hypertension, benign Low sdium diet - olmesartan (BENICAR) 40 MG tablet; Take 1 tablet (40 mg total) by mouth daily.  Dispense: 90 tablet; Refill: 1 - cloNIDine (CATAPRES) 0.1 MG tablet; Take 1 tablet (0.1 mg total) by mouth 2 (two) times daily.  Dispense: 180 tablet; Refill: 1  2. Diaphragmatic hernia without obstruction and without gangrene Do not over eat  3. Gastroesophageal reflux disease without esophagitis Avoid spicy foods Do not eat 2 hours prior to bedtime - omeprazole (PRILOSEC) 20 MG capsule; Take 1 capsule (20 mg total) by mouth daily.  Dispense: 90 capsule; Refill: 1  4. Hypothyroidism due to acquired atrophy of thyroid - levothyroxine (EUTHYROX) 88 MCG tablet; Take 1 tablet (88 mcg total) by mouth daily.  Dispense: 90 tablet; Refill: 1  5. Stage 3b chronic kidney disease Will do abs at next visit  6. Iron deficiency anemia, unspecified iron deficiency anemia type Continue iron supplement daily - Fe Fum-FA-B Cmp-C-Zn-Mg-Mn-Cu (HEMOCYTE PLUS) 106-1 MG CAPS;  Take 1 tablet by mouth at bedtime.  Dispense: 90 capsule; Refill: 1  7. GAD (generalized anxiety disorder) Stress management  8. BMI 32.0-32.9,adult Discussed diet and exercise for person with BMI >25 Will recheck weight in 3-6 months  9. Peripheral edema elevate legs when sittikng - furosemide (LASIX) 20 MG tablet; Take 1 tablet (20 mg total) by mouth daily.  Dispense: 90 tablet; Refill: 1   Labs pending Health Maintenance reviewed Diet and exercise encouraged  Follow up plan: 3 months      I discussed the assessment and treatment plan with the patient. The patient was provided an opportunity to ask questions and all were answered. The patient agreed with the plan and demonstrated an understanding of the instructions.   The patient was advised to call back or seek an in-person  evaluation if the symptoms worsen or if the condition fails to improve as anticipated.  The above assessment and management plan was discussed with the patient. The patient verbalized understanding of and has agreed to the management plan. Patient is aware to call the clinic if symptoms persist or worsen. Patient is aware when to return to the clinic for a follow-up visit. Patient educated on when it is appropriate to go to the emergency department.   Time call ended:  8:46  I provided 21 minutes of non-face-to-face time during this encounter.    [name], FNP

## 2019-08-23 ENCOUNTER — Ambulatory Visit: Payer: Medicare PPO | Admitting: Nurse Practitioner

## 2019-08-23 ENCOUNTER — Encounter: Payer: Self-pay | Admitting: Nurse Practitioner

## 2019-08-23 ENCOUNTER — Other Ambulatory Visit: Payer: Self-pay

## 2019-08-23 VITALS — BP 144/66 | HR 49 | Temp 98.0°F | Resp 20 | Ht 61.0 in | Wt 180.0 lb

## 2019-08-23 DIAGNOSIS — E034 Atrophy of thyroid (acquired): Secondary | ICD-10-CM

## 2019-08-23 DIAGNOSIS — F411 Generalized anxiety disorder: Secondary | ICD-10-CM | POA: Diagnosis not present

## 2019-08-23 DIAGNOSIS — M810 Age-related osteoporosis without current pathological fracture: Secondary | ICD-10-CM

## 2019-08-23 DIAGNOSIS — K219 Gastro-esophageal reflux disease without esophagitis: Secondary | ICD-10-CM | POA: Diagnosis not present

## 2019-08-23 DIAGNOSIS — R609 Edema, unspecified: Secondary | ICD-10-CM

## 2019-08-23 DIAGNOSIS — D509 Iron deficiency anemia, unspecified: Secondary | ICD-10-CM

## 2019-08-23 DIAGNOSIS — C50412 Malignant neoplasm of upper-outer quadrant of left female breast: Secondary | ICD-10-CM

## 2019-08-23 DIAGNOSIS — M1009 Idiopathic gout, multiple sites: Secondary | ICD-10-CM | POA: Diagnosis not present

## 2019-08-23 DIAGNOSIS — N1832 Chronic kidney disease, stage 3b: Secondary | ICD-10-CM

## 2019-08-23 DIAGNOSIS — I10 Essential (primary) hypertension: Secondary | ICD-10-CM

## 2019-08-23 DIAGNOSIS — K5730 Diverticulosis of large intestine without perforation or abscess without bleeding: Secondary | ICD-10-CM | POA: Diagnosis not present

## 2019-08-23 DIAGNOSIS — Z170 Estrogen receptor positive status [ER+]: Secondary | ICD-10-CM

## 2019-08-23 DIAGNOSIS — Z6832 Body mass index (BMI) 32.0-32.9, adult: Secondary | ICD-10-CM

## 2019-08-23 MED ORDER — LEVOTHYROXINE SODIUM 88 MCG PO TABS: 88.0000 ug | ORAL_TABLET | Freq: Every day | ORAL | 1 refills | Status: DC

## 2019-08-23 MED ORDER — OMEPRAZOLE 20 MG PO CPDR: 20.0000 mg | DELAYED_RELEASE_CAPSULE | Freq: Every day | ORAL | 1 refills | Status: DC

## 2019-08-23 MED ORDER — FUROSEMIDE 20 MG PO TABS: 20.0000 mg | ORAL_TABLET | Freq: Every day | ORAL | 1 refills | Status: DC

## 2019-08-23 MED ORDER — CLONIDINE HCL 0.1 MG PO TABS: 0.1000 mg | ORAL_TABLET | Freq: Two times a day (BID) | ORAL | 1 refills | Status: DC

## 2019-08-23 MED ORDER — OLMESARTAN MEDOXOMIL 40 MG PO TABS: 40.0000 mg | ORAL_TABLET | Freq: Every day | ORAL | 1 refills | Status: DC

## 2019-08-23 MED ORDER — BUSPIRONE HCL 5 MG PO TABS: 5.0000 mg | ORAL_TABLET | Freq: Every day | ORAL | 1 refills | Status: DC | PRN

## 2019-08-23 NOTE — Patient Instructions (Signed)
Stress, Adult Stress is a normal reaction to life events. Stress is what you feel when life demands more than you are used to, or more than you think you can handle. Some stress can be useful, such as studying for a test or meeting a deadline at work. Stress that occurs too often or for too long can cause problems. It can affect your emotional health and interfere with relationships and normal daily activities. Too much stress can weaken your body's defense system (immune system) and increase your risk for physical illness. If you already have a medical problem, stress can make it worse. What are the causes? All sorts of life events can cause stress. An event that causes stress for one person may not be stressful for another person. Major life events, whether positive or negative, commonly cause stress. Examples include:  Losing a job or starting a new job.  Losing a loved one.  Moving to a new town or home.  Getting married or divorced.  Having a baby.  Getting injured or sick. Less obvious life events can also cause stress, especially if they occur day after day or in combination with each other. Examples include:  Working long hours.  Driving in traffic.  Caring for children.  Being in debt.  Being in a difficult relationship. What are the signs or symptoms? Stress can cause emotional symptoms, including:  Anxiety. This is feeling worried, afraid, on edge, overwhelmed, or out of control.  Anger, including irritation or impatience.  Depression. This is feeling sad, down, helpless, or guilty.  Trouble focusing, remembering, or making decisions. Stress can cause physical symptoms, including:  Aches and pains. These may affect your head, neck, back, stomach, or other areas of your body.  Tight muscles or a clenched jaw.  Low energy.  Trouble sleeping. Stress can cause unhealthy behaviors, including:  Eating to feel better (overeating) or skipping meals.  Working too  much or putting off tasks.  Smoking, drinking alcohol, or using drugs to feel better. How is this diagnosed? Stress is diagnosed through an assessment by your health care provider. He or she may diagnose this condition based on:  Your symptoms and any stressful life events.  Your medical history.  Tests to rule out other causes of your symptoms. Depending on your condition, your health care provider may refer you to a specialist for further evaluation. How is this treated?  Stress management techniques are the recommended treatment for stress. Medicine is not typically recommended for the treatment of stress. Techniques to reduce your reaction to stressful life events include:  Stress identification. Monitor yourself for symptoms of stress and identify what causes stress for you. These skills may help you to avoid or prepare for stressful events.  Time management. Set your priorities, keep a calendar of events, and learn to say no. Taking these actions can help you avoid making too many commitments. Techniques for coping with stress include:  Rethinking the problem. Try to think realistically about stressful events rather than ignoring them or overreacting. Try to find the positives in a stressful situation rather than focusing on the negatives.  Exercise. Physical exercise can release both physical and emotional tension. The key is to find a form of exercise that you enjoy and do it regularly.  Relaxation techniques. These relax the body and mind. The key is to find one or more that you enjoy and use the techniques regularly. Examples include: ? Meditation, deep breathing, or progressive relaxation techniques. ? Yoga or   tai chi. ? Biofeedback, mindfulness techniques, or journaling. ? Listening to music, being out in nature, or participating in other hobbies.  Practicing a healthy lifestyle. Eat a balanced diet, drink plenty of water, limit or avoid caffeine, and get plenty of  sleep.  Having a strong support network. Spend time with family, friends, or other people you enjoy being around. Express your feelings and talk things over with someone you trust. Counseling or talk therapy with a mental health professional may be helpful if you are having trouble managing stress on your own. Follow these instructions at home: Lifestyle   Avoid drugs.  Do not use any products that contain nicotine or tobacco, such as cigarettes, e-cigarettes, and chewing tobacco. If you need help quitting, ask your health care provider.  Limit alcohol intake to no more than 1 drink a day for nonpregnant women and 2 drinks a day for men. One drink equals 12 oz of beer, 5 oz of wine, or 1 oz of hard liquor  Do not use alcohol or drugs to relax.  Eat a balanced diet that includes fresh fruits and vegetables, whole grains, lean meats, fish, eggs, and beans, and low-fat dairy. Avoid processed foods and foods high in added fat, sugar, and salt.  Exercise at least 30 minutes on 5 or more days each week.  Get 7-8 hours of sleep each night. General instructions   Practice stress management techniques as discussed with your health care provider.  Drink enough fluid to keep your urine clear or pale yellow.  Take over-the-counter and prescription medicines only as told by your health care provider.  Keep all follow-up visits as told by your health care provider. This is important. Contact a health care provider if:  Your symptoms get worse.  You have new symptoms.  You feel overwhelmed by your problems and can no longer manage them on your own. Get help right away if:  You have thoughts of hurting yourself or others. If you ever feel like you may hurt yourself or others, or have thoughts about taking your own life, get help right away. You can go to your nearest emergency department or call:  Your local emergency services (911 in the [place]  A suicide crisis helpline, such as the  [place] at (316) 250-6172. This is open 24 hours a day. Summary  Stress is a normal reaction to life events. It can cause problems if it happens too often or for too long.  Practicing stress management techniques is the best way to treat stress.  Counseling or talk therapy with a mental health professional may be helpful if you are having trouble managing stress on your own. This information is not intended to replace advice given to you by your health care provider. Make sure you discuss any questions you have with your health care provider. Document Revised: 11/24/2018 Document Reviewed: 06/16/2016 Elsevier Patient Education  [place].

## 2019-08-23 NOTE — Progress Notes (Signed)
Subjective:    Patient ID: Darlene Gregory, female    DOB: 01/31/1934, 86 y.o.   MRN: [id number]   Chief Complaint: Medical Management of Chronic Issues    HPI:  1. Essential hypertension, benign No c/o chest pain, sob or headache. Does  check blood pressure at home and her husband says it is always " good". Doe nsot know what readings are. BP Readings from Last 3 Encounters:  08/23/19 (!) 144/66  02/20/19 (!) 147/75  11/21/18 (!) 142/88     2. Hypothyroidism due to acquired atrophy of thyroid No problems that she is aware of. Lab Results  Component Value Date   TSH 3.860 11/21/2018     3. Gastroesophageal reflux disease without esophagitis Is on omeprazole dialy and that works well to keep symptoms under control.  4. Diverticulosis of colon Has had no recent flare ups.  5. Stage 3b chronic kidney disease No problems voiding Lab Results  Component Value Date   CREATININE 1.63 (H) 11/21/2018     6. Age-related osteoporosis without current pathological fracture She does very little weight bearing exercise. Is due for dexascan today according to chart. But she says she had one recently done by GYN.  7. GAD (generalized anxiety disorder) Anxiety has been a little worse since covid started. She says she gets stressed at times and occasionally feels that she needs some medication. She has nothing to take on as needed basis.  8. Acute idiopathic gout of multiple sites No recent flare ups  9. Iron deficiency anemia, unspecified iron deficiency anemia type She is still on iron supplement daily. Lab Results  Component Value Date   HGB 10.5 (L) 11/21/2018     10. Malignant neoplasm of upper-outer quadrant of left breast in female, estrogen receptor positive ([place]) Complete done with breast cancer treatments . She is still on tamoxifen without any side effects.  11. BMI 32.0-32.9,adult No recent weight changes. Wt Readings from Last 3 Encounters:  08/23/19 180 lb  (81.6 kg)  02/20/19 178 lb 12.8 oz (81.1 kg)  12/13/18 179 lb 12.8 oz (81.6 kg)   BMI Readings from Last 3 Encounters:  08/23/19 34.01 kg/m  02/20/19 33.78 kg/m  12/13/18 33.97 kg/m       Outpatient Encounter Medications as of 08/23/2019  Medication Sig  . aspirin EC 81 MG tablet Take 1 tablet (81 mg total) by mouth daily.  . Cholecalciferol (VITAMIN D) 2000 UNITS CAPS Take by mouth daily.  . cloNIDine (CATAPRES) 0.1 MG tablet Take 1 tablet (0.1 mg total) by mouth 2 (two) times daily.  . Fe Fum-FA-B Cmp-C-Zn-Mg-Mn-Cu (HEMOCYTE PLUS) 106-1 MG CAPS Take 1 tablet by mouth at bedtime.  Marland Kitchen FIBER PO Take 2 each by mouth daily.   . fish oil-omega-3 fatty acids 1000 MG capsule Take 1 g by mouth daily.  . furosemide (LASIX) 20 MG tablet Take 1 tablet (20 mg total) by mouth daily.  Marland Kitchen levothyroxine (EUTHYROX) 88 MCG tablet Take 1 tablet (88 mcg total) by mouth daily.  . Multiple Vitamins-Minerals (ICAPS PO) Take 1 tablet by mouth daily.   Marland Kitchen olmesartan (BENICAR) 40 MG tablet Take 1 tablet (40 mg total) by mouth daily.  Marland Kitchen omeprazole (PRILOSEC) 20 MG capsule Take 1 capsule (20 mg total) by mouth daily.  . tamoxifen (NOLVADEX) 20 MG tablet Take 1 tablet (20 mg total) by mouth daily.  . vitamin C (ASCORBIC ACID) 500 MG tablet Take 500 mg by mouth daily.     Past Surgical  History:  Procedure Laterality Date  . ABDOMINAL HYSTERECTOMY    . BREAST LUMPECTOMY WITH RADIOACTIVE SEED LOCALIZATION Left 10/29/2016   Procedure: LEFT BREAST LUMPECTOMY WITH RADIOACTIVE SEED LOCALIZATION;  Surgeon: Excell Seltzer, MD;  Location: [facility];  Service: General;  Laterality: Left;  . CAROTID BODY TUMOR EXCISION    . COLONOSCOPY W/ POLYPECTOMY    . EYE SURGERY Bilateral    cataract removed  . JOINT REPLACEMENT Right 2006   knee  . MASTECTOMY Left 2018  . PAROTIDECTOMY Left   . RE-EXCISION OF BREAST LUMPECTOMY Left 11/05/2016   Procedure: RE-EXCISION OF LEFT BREAST LUMPECTOMY;  Surgeon:  Excell Seltzer, MD;  Location: [facility];  Service: General;  Laterality: Left;  . TOTAL KNEE ARTHROPLASTY Left 03/13/2014   Procedure: LEFT TOTAL KNEE ARTHROPLASTY;  Surgeon: Kerin Salen, MD;  Location: [facility];  Service: Orthopedics;  Laterality: Left;  . TOTAL MASTECTOMY Left 11/12/2016   Procedure: LEFT TOTAL MASTECTOMY;  Surgeon: Excell Seltzer, MD;  Location: [facility];  Service: General;  Laterality: Left;    Family History  Problem Relation Age of Onset  . Heart disease Mother   . Emphysema Father   . Cancer Sister        ovarian    New complaints: None today  Social history: Lives with her husband  Controlled substance contract: n/a    Review of Systems  Constitutional: Negative for diaphoresis.  Eyes: Negative for pain.  Respiratory: Negative for shortness of breath.   Cardiovascular: Negative for chest pain, palpitations and leg swelling.  Gastrointestinal: Negative for abdominal pain.  Endocrine: Negative for polydipsia.  Skin: Negative for rash.  Neurological: Negative for dizziness, weakness and headaches.  Hematological: Does not bruise/bleed easily.  All other systems reviewed and are negative.      Objective:   Physical Exam Vitals and nursing note reviewed.  Constitutional:      General: She is not in acute distress.    Appearance: Normal appearance. She is well-developed.  HENT:     Head: Normocephalic.     Nose: Nose normal.  Eyes:     Pupils: Pupils are equal, round, and reactive to light.  Neck:     Vascular: No carotid bruit or JVD.  Cardiovascular:     Rate and Rhythm: Normal rate and regular rhythm.     Heart sounds: Murmur (1/6 systolic murmur) present.  Pulmonary:     Effort: Pulmonary effort is normal. No respiratory distress.     Breath sounds: Normal breath sounds. No wheezing or rales.  Chest:     Chest wall: No tenderness.  Abdominal:     General: Bowel sounds are normal. There is no  distension or abdominal bruit.     Palpations: Abdomen is soft. There is no hepatomegaly, splenomegaly, mass or pulsatile mass.     Tenderness: There is no abdominal tenderness.  Musculoskeletal:        General: Normal range of motion.     Cervical back: Normal range of motion and neck supple.     Right lower leg: Edema (1+) present.     Left lower leg: Edema (1+) present.  Lymphadenopathy:     Cervical: No cervical adenopathy.  Skin:    General: Skin is warm and dry.  Neurological:     Mental Status: She is alert and oriented to person, place, and time.     Deep Tendon Reflexes: Reflexes are normal and symmetric.  Psychiatric:  Behavior: Behavior normal.        Thought Content: Thought content normal.        Judgment: Judgment normal.    BP (!) 144/66   Pulse (!) 49   Temp 98 F (36.7 C) (Temporal)   Resp 20   Ht _0  (1.549 m)   Wt 180 lb (81.6 kg)   SpO2 99%   BMI 34.01 kg/m         Assessment & Plan:  MACKINZIE VUNCANNON comes in today with chief complaint of Medical Management of Chronic Issues   Diagnosis and orders addressed:  1. Essential hypertension, benign Low sodium diet - olmesartan (BENICAR) 40 MG tablet; Take 1 tablet (40 mg total) by mouth daily.  Dispense: 90 tablet; Refill: 1 - cloNIDine (CATAPRES) 0.1 MG tablet; Take 1 tablet (0.1 mg total) by mouth 2 (two) times daily.  Dispense: 180 tablet; Refill: 1 - CMP14+EGFR - Lipid panel  2. Hypothyroidism due to acquired atrophy of thyroid - levothyroxine (EUTHYROX) 88 MCG tablet; Take 1 tablet (88 mcg total) by mouth daily.  Dispense: 90 tablet; Refill: 1 - Thyroid Panel With TSH  3. Gastroesophageal reflux disease without esophagitis Avoid spicy foods Do not eat 2 hours prior to bedtime - omeprazole (PRILOSEC) 20 MG capsule; Take 1 capsule (20 mg total) by mouth daily.  Dispense: 90 capsule; Refill: 1  4. Diverticulosis of colon Continue to watch diet  5. Stage 3b chronic kidney disease Labs  pending  6. Age-related osteoporosis without current pathological fracture Weight bearing exerrcises Will send for dexascan results  7. GAD (generalized anxiety disorder) Stress management encouraged Added buspar prn for anxiety - busPIRone (BUSPAR) 5 MG tablet; Take 1 tablet (5 mg total) by mouth daily as needed.  Dispense: 30 tablet; Refill: 1  8. Acute idiopathic gout of multiple sites Low purine diet  9. Iron deficiency anemia, unspecified iron deficiency anemia type - CBC with Differential/Platelet  10. Malignant neoplasm of upper-outer quadrant of left breast in female, estrogen receptor positive ([place]) Keep follow up with oncology yearly  11. BMI 32.0-32.9,adult Discussed diet and exercise for person with BMI >25 Will recheck weight in 3-6 months  12. Peripheral edema elevate legs when sittin - furosemide (LASIX) 20 MG tablet; Take 1 tablet (20 mg total) by mouth daily.  Dispense: 90 tablet; Refill: 1   Labs pending Health Maintenance reviewed Diet and exercise encouraged  Follow up plan: 6 months   [name], FNP

## 2019-08-24 LAB — CMP14+EGFR
ALT: 11 IU/L (ref 0–32)
AST: 16 IU/L (ref 0–40)
Albumin/Globulin Ratio: 1.5 (ref 1.2–2.2)
Albumin: 3.9 g/dL (ref 3.6–4.6)
Alkaline Phosphatase: 74 IU/L (ref 39–117)
BUN/Creatinine Ratio: 16 (ref 12–28)
BUN: 27 mg/dL (ref 8–27)
Bilirubin Total: 0.3 mg/dL (ref 0.0–1.2)
CO2: 25 mmol/L (ref 20–29)
Calcium: 9.3 mg/dL (ref 8.7–10.3)
Chloride: 104 mmol/L (ref 96–106)
Creatinine, Ser: 1.73 mg/dL — ABNORMAL HIGH (ref 0.57–1.00)
GFR calc Af Amer: 31 mL/min/{1.73_m2} — ABNORMAL LOW (ref >59)
GFR calc non Af Amer: 27 mL/min/{1.73_m2} — ABNORMAL LOW (ref >59)
Globulin, Total: 2.6 g/dL (ref 1.5–4.5)
Glucose: 98 mg/dL (ref 65–99)
Potassium: 4.3 mmol/L (ref 3.5–5.2)
Sodium: 140 mmol/L (ref 134–144)
Total Protein: 6.5 g/dL (ref 6.0–8.5)

## 2019-08-24 LAB — LIPID PANEL
Chol/HDL Ratio: 2.2 ratio (ref 0.0–4.4)
Cholesterol, Total: 145 mg/dL (ref 100–199)
HDL: 67 mg/dL (ref >39)
LDL Chol Calc (NIH): 65 mg/dL (ref 0–99)
Triglycerides: 62 mg/dL (ref 0–149)
VLDL Cholesterol Cal: 13 mg/dL (ref 5–40)

## 2019-08-24 LAB — CBC WITH DIFFERENTIAL/PLATELET
Basophils Absolute: 0.1 10*3/uL (ref 0.0–0.2)
Basos: 1 %
EOS (ABSOLUTE): 0.2 10*3/uL (ref 0.0–0.4)
Eos: 4 %
Hematocrit: 33.3 % — ABNORMAL LOW (ref 34.0–46.6)
Hemoglobin: 11.4 g/dL (ref 11.1–15.9)
Immature Grans (Abs): 0 10*3/uL (ref 0.0–0.1)
Immature Granulocytes: 0 %
Lymphocytes Absolute: 1.3 10*3/uL (ref 0.7–3.1)
Lymphs: 21 %
MCH: 32.9 pg (ref 26.6–33.0)
MCHC: 34.2 g/dL (ref 31.5–35.7)
MCV: 96 fL (ref 79–97)
Monocytes Absolute: 0.5 10*3/uL (ref 0.1–0.9)
Monocytes: 8 %
Neutrophils Absolute: 4.2 10*3/uL (ref 1.4–7.0)
Neutrophils: 66 %
Platelets: 175 10*3/uL (ref 150–450)
RBC: 3.47 x10E6/uL — ABNORMAL LOW (ref 3.77–5.28)
RDW: 12.2 % (ref 11.7–15.4)
WBC: 6.3 10*3/uL (ref 3.4–10.8)

## 2019-08-24 LAB — THYROID PANEL WITH TSH
Free Thyroxine Index: 2.7 (ref 1.2–4.9)
T3 Uptake Ratio: 28 % (ref 24–39)
T4, Total: 9.5 ug/dL (ref 4.5–12.0)
TSH: 4.72 u[IU]/mL — ABNORMAL HIGH (ref 0.450–4.500)

## 2019-08-27 ENCOUNTER — Telehealth: Payer: Self-pay | Admitting: Nurse Practitioner

## 2019-08-27 MED ORDER — LEVOTHYROXINE SODIUM 100 MCG PO TABS: 100.0000 ug | ORAL_TABLET | Freq: Every day | ORAL | 3 refills | Status: DC

## 2019-08-27 NOTE — Addendum Note (Signed)
Addended by: Chevis Pretty on: 08/27/2019 06:15 PM   Modules accepted: Orders

## 2019-08-27 NOTE — Telephone Encounter (Signed)
I have spoken with patient about her lab results. I have informed her that they have not been reviewed by Mary-Margaret at this time. Therefore, I cannot make any suggestions. She requested that I mail her a copy and forward a message about d/c Omeprazole because it can affect the kidneys. Results mailed to pts home address. Please call to discuss results when available.

## 2019-08-27 NOTE — Telephone Encounter (Signed)
I attempted to contact patient with lab results but  no answer. Please make sure she ois contacted and that she is sent a copy of results

## 2019-08-28 ENCOUNTER — Other Ambulatory Visit: Payer: Self-pay | Admitting: *Deleted

## 2019-08-28 ENCOUNTER — Encounter: Payer: Self-pay | Admitting: *Deleted

## 2019-08-28 DIAGNOSIS — E034 Atrophy of thyroid (acquired): Secondary | ICD-10-CM

## 2019-08-28 NOTE — Telephone Encounter (Signed)
Patient aware.

## 2019-09-18 ENCOUNTER — Other Ambulatory Visit: Payer: Medicare PPO

## 2019-09-18 ENCOUNTER — Other Ambulatory Visit: Payer: Self-pay

## 2019-09-18 DIAGNOSIS — E034 Atrophy of thyroid (acquired): Secondary | ICD-10-CM

## 2019-09-19 LAB — TSH: TSH: 2.9 u[IU]/mL (ref 0.450–4.500)

## 2019-10-30 ENCOUNTER — Other Ambulatory Visit: Payer: Medicare Other

## 2019-10-30 ENCOUNTER — Ambulatory Visit: Payer: Medicare Other | Admitting: Oncology

## 2019-11-15 ENCOUNTER — Other Ambulatory Visit: Payer: Self-pay | Admitting: Oncology

## 2019-11-16 ENCOUNTER — Telehealth: Payer: Self-pay | Admitting: Oncology

## 2019-11-16 NOTE — Telephone Encounter (Signed)
Scheduled per 7/9 sch message. Pt Is aware of appts and requested afternoons.

## 2019-12-06 DIAGNOSIS — Z1231 Encounter for screening mammogram for malignant neoplasm of breast: Secondary | ICD-10-CM | POA: Diagnosis not present

## 2019-12-24 ENCOUNTER — Ambulatory Visit (INDEPENDENT_AMBULATORY_CARE_PROVIDER_SITE_OTHER): Payer: Medicare PPO | Admitting: *Deleted

## 2019-12-24 DIAGNOSIS — Z0000 Encounter for general adult medical examination without abnormal findings: Secondary | ICD-10-CM

## 2019-12-24 NOTE — Progress Notes (Signed)
MEDICARE ANNUAL WELLNESS VISIT  12/24/2019  Telephone Visit Disclaimer This Medicare AWV was conducted by telephone due to national recommendations for restrictions regarding the COVID-19 Pandemic (e.g. social distancing).  I verified, using two identifiers, that I am speaking with Darlene Gregory or their authorized healthcare agent. I discussed the limitations, risks, security, and privacy concerns of performing an evaluation and management service by telephone and the potential availability of an in-person appointment in the future. The patient expressed understanding and agreed to proceed.   Subjective:  Darlene Gregory is a 86 y.o. female patient of Chevis Pretty, [place] who had a Medicare Annual Wellness Visit today via telephone. [name] is Retired and lives with their spouse. she has 2 children. she reports that she is socially active and does interact with friends/family regularly. she is minimally physically active and enjoys cooking.  Patient Care Team: Chevis Pretty, FNP as PCP - General (Nurse Practitioner) Excell Seltzer, MD (Inactive) as Consulting Physician (General Surgery) Magrinat, Virgie Dad, MD as Consulting Physician (Oncology) Eppie Gibson, MD as Attending Physician (Radiation Oncology) Arvella Nigh, MD as Consulting Physician (Obstetrics and Gynecology)  Advanced Directives 12/24/2019 12/13/2018 10/17/2017 11/12/2016 11/09/2016 11/05/2016 11/03/2016  Does Patient Have a Medical Advance Directive? No No Yes Yes Yes No No  Type of Advance Directive - Public librarian;Living will [place];Living will - - -  Does patient want to make changes to medical advance directive? - - No - Patient declined No - Patient declined - - -  Copy of [place] in Chart? - - No - copy requested No - copy requested No - copy requested - -  Would patient like information on creating a medical advance directive? No - Patient declined Yes  (MAU/Ambulatory/Procedural Areas - Information given) - No - Patient declined - No - Patient declined -    Hospital Utilization Over the Past 12 Months: # of hospitalizations or ER visits: 0 # of surgeries: 0  Review of Systems    Patient reports that her overall health is unchanged compared to last year.  History obtained from chart review and the patient  Patient Reported Readings (BP, Pulse, CBG, Weight, etc) none  Pain Assessment Pain : No/denies pain     Current Medications & Allergies (verified) Allergies as of 12/24/2019      Reactions   Hyoscyamine Sulfate    REACTION: rash   Norvasc [amlodipine Besylate] Swelling   Pyridium  [phenazopyridine Hcl]    Phenazopyridine Hcl Rash   Sulfonamide Derivatives Rash      Medication List       Accurate as of December 24, 2019  8:53 AM. If you have any questions, ask your nurse or doctor.        aspirin EC 81 MG tablet Take 1 tablet (81 mg total) by mouth daily.   busPIRone 5 MG tablet Commonly known as: BUSPAR Take 1 tablet (5 mg total) by mouth daily as needed.   cloNIDine 0.1 MG tablet Commonly known as: CATAPRES Take 1 tablet (0.1 mg total) by mouth 2 (two) times daily.   FIBER PO Take 2 each by mouth daily.   fish oil-omega-3 fatty acids 1000 MG capsule Take 1 g by mouth daily.   furosemide 20 MG tablet Commonly known as: LASIX Take 1 tablet (20 mg total) by mouth daily.   levothyroxine 100 MCG tablet Commonly known as: SYNTHROID Take 1 tablet (100 mcg total) by mouth daily.  olmesartan 40 MG tablet Commonly known as: Benicar Take 1 tablet (40 mg total) by mouth daily.   omeprazole 20 MG capsule Commonly known as: PRILOSEC Take 1 capsule (20 mg total) by mouth daily.   PRESERVISION AREDS PO Take by mouth. What changed: Another medication with the same name was removed. Continue taking this medication, and follow the directions you see here.   SLOW FE PO Take by mouth.   tamoxifen 20 MG  tablet Commonly known as: NOLVADEX Take 1 tablet by mouth once daily   vitamin C 500 MG tablet Commonly known as: ASCORBIC ACID Take 500 mg by mouth daily.   Vitamin D 50 MCG (2000 UT) Caps Take by mouth daily.       History (reviewed): Past Medical History:  Diagnosis Date  . Cancer ([place]) 10/2016   left breast cancer  . Difficult intubation    "small trachea"; 01/18/05: glidescope but unable to pass stylet -->fast track LMA with blind passage for ETT, consider awake intubation; 08/14/08: IV induction and DL with glidesecope for ETT  . DJD (degenerative joint disease)   . Frequent urination   . GERD (gastroesophageal reflux disease)   . Hypertension   . Hypothyroidism   . Osteoporosis   . PONV (postoperative nausea and vomiting)   . Thyroid disease    Past Surgical History:  Procedure Laterality Date  . ABDOMINAL HYSTERECTOMY    . BREAST LUMPECTOMY WITH RADIOACTIVE SEED LOCALIZATION Left 10/29/2016   Procedure: LEFT BREAST LUMPECTOMY WITH RADIOACTIVE SEED LOCALIZATION;  Surgeon: Excell Seltzer, MD;  Location: [facility];  Service: General;  Laterality: Left;  . CAROTID BODY TUMOR EXCISION    . COLONOSCOPY W/ POLYPECTOMY    . EYE SURGERY Bilateral    cataract removed  . JOINT REPLACEMENT Right 2006   knee  . MASTECTOMY Left 2018  . PAROTIDECTOMY Left   . RE-EXCISION OF BREAST LUMPECTOMY Left 11/05/2016   Procedure: RE-EXCISION OF LEFT BREAST LUMPECTOMY;  Surgeon: Excell Seltzer, MD;  Location: [facility];  Service: General;  Laterality: Left;  . TOTAL KNEE ARTHROPLASTY Left 03/13/2014   Procedure: LEFT TOTAL KNEE ARTHROPLASTY;  Surgeon: Kerin Salen, MD;  Location: [facility];  Service: Orthopedics;  Laterality: Left;  . TOTAL MASTECTOMY Left 11/12/2016   Procedure: LEFT TOTAL MASTECTOMY;  Surgeon: Excell Seltzer, MD;  Location: [facility];  Service: General;  Laterality: Left;   Family History  Problem Relation Age of  Onset  . Heart disease Mother   . Emphysema Father   . Cancer Sister        ovarian   Social History   Socioeconomic History  . Marital status: Married    Spouse name: [name]  . Number of children: 2  . Years of education: 77  . Highest education level: 12th grade  Occupational History  . Occupation: Optometrist    Comment: Retired  Tobacco Use  . Smoking status: Former Research scientist (life sciences)  . Smokeless tobacco: Never Used  Vaping Use  . Vaping Use: Never used  Substance and Sexual Activity  . Alcohol use: No  . Drug use: No  . Sexual activity: Not Currently  Other Topics Concern  . Not on file  Social History Narrative  . Not on file   Social Determinants of Health   Financial Resource Strain: Low Risk   . Difficulty of Paying Living Expenses: Not hard at all  Food Insecurity: No Food Insecurity  . Worried About [employer] of  Food in the Last Year: Never true  . Ran Out of Food in the Last Year: Never true  Transportation Needs: No Transportation Needs  . Lack of Transportation (Medical): No  . Lack of Transportation (Non-Medical): No  Physical Activity: Inactive  . Days of Exercise per Week: 0 days  . Minutes of Exercise per Session: 0 min  Stress: No Stress Concern Present  . Feeling of Stress : Not at all  Social Connections: Socially Integrated  . Frequency of Communication with Friends and Family: More than three times a week  . Frequency of Social Gatherings with Friends and Family: More than three times a week  . Attends Religious Services: More than 4 times per year  . Active Member of Clubs or Organizations: Yes  . Attends Archivist Meetings: More than 4 times per year  . Marital Status: Married    Activities of Daily Living In your present state of health, do you have any difficulty performing the following activities: 12/24/2019  Hearing? N  Vision? Y  Comment wears rx glasses-still unable to see clear enough to read numbers or letters due  to "dry" macular degeneration  Difficulty concentrating or making decisions? N  Walking or climbing stairs? N  Dressing or bathing? N  Doing errands, shopping? N  Preparing Food and eating ? N  Using the Toilet? N  In the past six months, have you accidently leaked urine? Y  Comment wears a pad at all times  Do you have problems with loss of bowel control? N  Managing your Medications? N  Managing your Finances? N  Housekeeping or managing your Housekeeping? N  Some recent data might be hidden    Patient Education/ Literacy How often do you need to have someone help you when you read instructions, pamphlets, or other written materials from your doctor or pharmacy?: 1 - Never What is the last grade level you completed in school?: 12th Grade  Exercise Current Exercise Habits: The patient does not participate in regular exercise at present, Exercise limited by: orthopedic condition(s)  Diet Patient reports consuming 3 meals a day and 1 snack(s) a day Patient reports that her primary diet is: Regular Patient reports that she does have regular access to food.   Depression Screen PHQ 2/9 Scores 12/24/2019 08/23/2019 02/20/2019 12/13/2018 11/21/2018 10/27/2018 03/23/2018  PHQ - 2 Score 0 0 0 0 0 0 0  PHQ- 9 Score - - - 0 - 0 -     Fall Risk Fall Risk  12/24/2019 08/23/2019 02/20/2019 12/13/2018 11/21/2018  Falls in the past year? 0 0 1 1 1   Number falls in past yr: - - 0 1 1  Injury with Fall? - - 1 1 1   Comment - - - - Knee laceration and hairline fracture.  Risk for fall due to : - - - History of fall(s) -     Objective:  Darlene Gregory seemed alert and oriented and she participated appropriately during our telephone visit.  Blood Pressure Weight BMI  BP Readings from Last 3 Encounters:  08/23/19 (!) 144/66  02/20/19 (!) 147/75  11/21/18 (!) 142/88   Wt Readings from Last 3 Encounters:  08/23/19 180 lb (81.6 kg)  02/20/19 178 lb 12.8 oz (81.1 kg)  12/13/18 179 lb 12.8 oz (81.6 kg)    BMI Readings from Last 1 Encounters:  08/23/19 34.01 kg/m    *Unable to obtain current vital signs, weight, and BMI due to telephone visit type  Hearing/Vision  . Damian did not seem to have difficulty with hearing/understanding during the telephone conversation . Reports that she has had a formal eye exam by an eye care professional within the past year . Reports that she has not had a formal hearing evaluation within the past year *Unable to fully assess hearing and vision during telephone visit type  Cognitive Function: 6CIT Screen 12/24/2019 12/13/2018  What Year? 0 points 0 points  What month? 0 points 0 points  What time? 0 points 0 points  Count back from 20 0 points 0 points  Months in reverse 2 points 0 points  Repeat phrase 4 points 0 points  Total Score 6 0   (Normal:0-7, Significant for Dysfunction: >8)  Normal Cognitive Function Screening: Yes   Immunization & Health Maintenance Record Immunization History  Administered Date(s) Administered  . Moderna SARS-COVID-2 Vaccination 06/02/2019, 07/05/2019  . Pneumococcal Conjugate-13 02/13/2014  . Pneumococcal Polysaccharide-23 03/31/2010  . Tdap 11/05/2013  . Zoster Recombinat (Shingrix) 09/28/2016    Health Maintenance  Topic Date Due  . Samul Dada  11/06/2023  . DEXA SCAN  Completed  . COVID-19 Vaccine  Completed  . PNA vac Low Risk Adult  Completed  . MAMMOGRAM  Discontinued  . COLONOSCOPY  Discontinued       Assessment  This is a routine wellness examination for Darlene Gregory.  Health Maintenance: Due or Overdue There are no preventive care reminders to display for this patient.  Darlene Gregory does not need a referral for [employer] Assistance: Care Management:   no Social Work:    no Prescription Assistance:  no Nutrition/Diabetes Education:  no   Plan:  Personalized Goals Goals Addressed            This Visit's Progress   . Prevent falls        Personalized Health Maintenance &  Screening Recommendations  Influenza vaccine  Lung Cancer Screening Recommended: no (Low Dose CT Chest recommended if Age 32-80 years, 30 pack-year currently smoking OR have quit w/in past 15 years) Hepatitis C Screening recommended: no HIV Screening recommended: no  Advanced Directives: Written information was not prepared per patient's request.  Referrals & Orders No orders of the defined types were placed in this encounter.   Follow-up Plan . Follow-up with Chevis Pretty, FNP as planned . Consider Flu vaccine at your next visit with your PCP   I have personally reviewed and noted the following in the patient's chart:   . Medical and social history . Use of alcohol, tobacco or illicit drugs  . Current medications and supplements . Functional ability and status . Nutritional status . Physical activity . Advanced directives . List of other physicians . Hospitalizations, surgeries, and ER visits in previous 12 months . Vitals . Screenings to include cognitive, depression, and falls . Referrals and appointments  In addition, I have reviewed and discussed with Darlene Gregory certain preventive protocols, quality metrics, and best practice recommendations. A written personalized care plan for preventive services as well as general preventive health recommendations is available and can be mailed to the patient at her request.      Milas Hock, LPN  11/08/6376

## 2019-12-24 NOTE — Patient Instructions (Signed)
Preventive Care 51 Years and Older, Female Preventive care refers to lifestyle choices and visits with your health care provider that can promote health and wellness. This includes:  A yearly physical exam. This is also called an annual well check.  Regular dental and eye exams.  Immunizations.  Screening for certain conditions.  Healthy lifestyle choices, such as diet and exercise. What can I expect for my preventive care visit? Physical exam Your health care provider will check:  Height and weight. These may be used to calculate body mass index (BMI), which is a measurement that tells if you are at a healthy weight.  Heart rate and blood pressure.  Your skin for abnormal spots. Counseling Your health care provider may ask you questions about:  Alcohol, tobacco, and drug use.  Emotional well-being.  Home and relationship well-being.  Sexual activity.  Eating habits.  History of falls.  Memory and ability to understand (cognition).  Work and work Statistician.  Pregnancy and menstrual history. What immunizations do I need?  Influenza (flu) vaccine  This is recommended every year. Tetanus, diphtheria, and pertussis (Tdap) vaccine  You may need a Td booster every 10 years. Varicella (chickenpox) vaccine  You may need this vaccine if you have not already been vaccinated. Zoster (shingles) vaccine  You may need this after age 39. Pneumococcal conjugate (PCV13) vaccine  One dose is recommended after age 73. Pneumococcal polysaccharide (PPSV23) vaccine  One dose is recommended after age 13. Measles, mumps, and rubella (MMR) vaccine  You may need at least one dose of MMR if you were born in 1957 or later. You may also need a second dose. Meningococcal conjugate (MenACWY) vaccine  You may need this if you have certain conditions. Hepatitis A vaccine  You may need this if you have certain conditions or if you travel or work in places where you may be exposed  to hepatitis A. Hepatitis B vaccine  You may need this if you have certain conditions or if you travel or work in places where you may be exposed to hepatitis B. Haemophilus influenzae type b (Hib) vaccine  You may need this if you have certain conditions. You may receive vaccines as individual doses or as more than one vaccine together in one shot (combination vaccines). Talk with your health care provider about the risks and benefits of combination vaccines. What tests do I need? Blood tests  Lipid and cholesterol levels. These may be checked every 5 years, or more frequently depending on your overall health.  Hepatitis C test.  Hepatitis B test. Screening  Lung cancer screening. You may have this screening every year starting at age 69 if you have a 30-pack-year history of smoking and currently smoke or have quit within the past 15 years.  Colorectal cancer screening. All adults should have this screening starting at age 71 and continuing until age 4. Your health care provider may recommend screening at age 64 if you are at increased risk. You will have tests every 1-10 years, depending on your results and the type of screening test.  Diabetes screening. This is done by checking your blood sugar (glucose) after you have not eaten for a while (fasting). You may have this done every 1-3 years.  Mammogram. This may be done every 1-2 years. Talk with your health care provider about how often you should have regular mammograms.  BRCA-related cancer screening. This may be done if you have a family history of breast, ovarian, tubal, or peritoneal cancers.  Other tests °· Sexually transmitted disease (STD) testing. °· Bone density scan. This is done to screen for osteoporosis. You may have this done starting at age 65. °Follow these instructions at home: °Eating and drinking °· Eat a diet that includes fresh fruits and vegetables, whole grains, lean protein, and low-fat dairy products. Limit  your intake of foods with high amounts of sugar, saturated fats, and salt. °· Take vitamin and mineral supplements as recommended by your health care provider. °· Do not drink alcohol if your health care provider tells you not to drink. °· If you drink alcohol: °? Limit how much you have to 0-1 drink a day. °? Be aware of how much alcohol is in your drink. In the [place] one drink equals one 12 oz bottle of beer (355 mL), one 5 oz glass of wine (148 mL), or one 1½ oz glass of hard liquor (44 mL). °Lifestyle °· Take daily care of your teeth and gums. °· Stay active. Exercise for at least 30 minutes on 5 or more days each week. °· Do not use any products that contain nicotine or tobacco, such as cigarettes, e-cigarettes, and chewing tobacco. If you need help quitting, ask your health care provider. °· If you are sexually active, practice safe sex. Use a condom or other form of protection in order to prevent STIs (sexually transmitted infections). °· Talk with your health care provider about taking a low-dose aspirin or statin. °What's next? °· Go to your health care provider once a year for a well check visit. °· Ask your health care provider how often you should have your eyes and teeth checked. °· Stay up to date on all vaccines. °This information is not intended to replace advice given to you by your health care provider. Make sure you discuss any questions you have with your health care provider. °Document Revised: 04/20/2018 Document Reviewed: 04/20/2018 °Elsevier Patient Education © 2020 [employer] ° °

## 2020-02-05 ENCOUNTER — Other Ambulatory Visit: Payer: Self-pay

## 2020-02-05 ENCOUNTER — Inpatient Hospital Stay: Payer: Medicare PPO | Attending: Oncology | Admitting: Oncology

## 2020-02-05 VITALS — BP 183/73 | HR 63 | Temp 97.9°F | Resp 18 | Ht 61.0 in | Wt 175.0 lb

## 2020-02-05 DIAGNOSIS — Z79810 Long term (current) use of selective estrogen receptor modulators (SERMs): Secondary | ICD-10-CM | POA: Diagnosis not present

## 2020-02-05 DIAGNOSIS — Z79899 Other long term (current) drug therapy: Secondary | ICD-10-CM | POA: Diagnosis not present

## 2020-02-05 DIAGNOSIS — Z882 Allergy status to sulfonamides status: Secondary | ICD-10-CM | POA: Insufficient documentation

## 2020-02-05 DIAGNOSIS — Z8041 Family history of malignant neoplasm of ovary: Secondary | ICD-10-CM | POA: Insufficient documentation

## 2020-02-05 DIAGNOSIS — Z90722 Acquired absence of ovaries, bilateral: Secondary | ICD-10-CM | POA: Diagnosis not present

## 2020-02-05 DIAGNOSIS — Z8249 Family history of ischemic heart disease and other diseases of the circulatory system: Secondary | ICD-10-CM | POA: Diagnosis not present

## 2020-02-05 DIAGNOSIS — Z170 Estrogen receptor positive status [ER+]: Secondary | ICD-10-CM

## 2020-02-05 DIAGNOSIS — Z87891 Personal history of nicotine dependence: Secondary | ICD-10-CM | POA: Insufficient documentation

## 2020-02-05 DIAGNOSIS — Z9012 Acquired absence of left breast and nipple: Secondary | ICD-10-CM | POA: Insufficient documentation

## 2020-02-05 DIAGNOSIS — Z836 Family history of other diseases of the respiratory system: Secondary | ICD-10-CM | POA: Insufficient documentation

## 2020-02-05 DIAGNOSIS — J449 Chronic obstructive pulmonary disease, unspecified: Secondary | ICD-10-CM | POA: Insufficient documentation

## 2020-02-05 DIAGNOSIS — C50412 Malignant neoplasm of upper-outer quadrant of left female breast: Secondary | ICD-10-CM | POA: Diagnosis not present

## 2020-02-05 DIAGNOSIS — K219 Gastro-esophageal reflux disease without esophagitis: Secondary | ICD-10-CM | POA: Insufficient documentation

## 2020-02-05 DIAGNOSIS — E039 Hypothyroidism, unspecified: Secondary | ICD-10-CM | POA: Insufficient documentation

## 2020-02-05 DIAGNOSIS — I10 Essential (primary) hypertension: Secondary | ICD-10-CM | POA: Diagnosis not present

## 2020-02-05 MED ORDER — TAMOXIFEN CITRATE 20 MG PO TABS
20.0000 mg | ORAL_TABLET | Freq: Every day | ORAL | 4 refills | Status: DC
Start: 2020-02-05 — End: 2021-04-20

## 2020-02-05 NOTE — Progress Notes (Signed)
[facility]  Telephone:([phone]) [phone] [phone]     ID: Darlene Gregory DOB: 12-12-1933  MR#: [id number]  IFS#:[id number]  Patient Care Team: Bennie Pierini, FNP as PCP - General (Nurse Practitioner) Glenna Fellows, MD (Inactive) as Consulting Physician (General Surgery) Serah Nicoletti, Valentino Hue, MD as Consulting Physician (Oncology) Lonie Peak, MD as Attending Physician (Radiation Oncology) Richardean Chimera, MD as Consulting Physician (Obstetrics and Gynecology) OTHER MD:  CHIEF COMPLAINT: Estrogen receptor positive breast cancer  CURRENT TREATMENT: Tamoxifen   INTERVAL HISTORY: [name] returns today for follow-up of her estrogen receptor positive breast cancer.  She continues on tamoxifen. She has some occasional got flashes. They do not wake her up at night  Since her last visit here[facility]  We have requested those results  REVIEW OF SYSTEMS: Darlene Gregory tells me her husband is currently in the hospital in [facility]. He has problems with COPD. She visits him every day and helps feed him. She is at the [facility] vaccine x2 most recently in February she thinks. She does not walk outside the home but she stays on her feet all day doing housework she says. A detailed review of systems today was otherwise stable   BREAST CANCER HISTORY: From the original intake note:   Ms. [name] had screening mammography April 2018 showing a possible mass in the left breast. She was referred to the [facility] where she underwent left mammography and ultrasonography on 09/10/2016. This showed the breast density to be category C. In the upper outer quadrant of the breast there was an area of architectural distortion which was not palpable. Ultrasonography confirmed a 0.9 cm hypoechoic mass at the 1:30 o'clock position of the left breast 3 cm from the nipple. Ultrasound of the left axilla showed a suspicious lymph node.  On  09/24/2016 the patient underwent biopsy of the left breast mass and the left axillary lymph node in question. The lymph node was benign, and this was read as concordant by radiology. The left breast mass however showed invasive ductal carcinoma, grade 2, estrogen receptor 100% positive, progesterone receptor 95% positive, both with strong staining intensity, with an MIB-1 of 5%, and no HER-2 amplification, the signals ratio being 1.46 and the number per cell 1.90.  The patient's subsequent history is as detailed below   PAST MEDICAL HISTORY: Past Medical History:  Diagnosis Date  . Cancer (HCC) 10/2016   left breast cancer  . Difficult intubation    "small trachea"; 01/18/05: glidescope but unable to pass stylet -->fast track LMA with blind passage for ETT, consider awake intubation; 08/14/08: IV induction and DL with glidesecope for ETT  . DJD (degenerative joint disease)   . Frequent urination   . GERD (gastroesophageal reflux disease)   . Hypertension   . Hypothyroidism   . Osteoporosis   . PONV (postoperative nausea and vomiting)   . Thyroid disease     PAST SURGICAL HISTORY: Past Surgical History:  Procedure Laterality Date  . ABDOMINAL HYSTERECTOMY    . BREAST LUMPECTOMY WITH RADIOACTIVE SEED LOCALIZATION Left 10/29/2016   Procedure: LEFT BREAST LUMPECTOMY WITH RADIOACTIVE SEED LOCALIZATION;  Surgeon: Glenna Fellows, MD;  Location: [facility];  Service: General;  Laterality: Left;  . CAROTID BODY TUMOR EXCISION    . COLONOSCOPY W/ POLYPECTOMY    . EYE SURGERY Bilateral    cataract removed  . JOINT REPLACEMENT Right 2006   knee  . MASTECTOMY Left 2018  .  PAROTIDECTOMY Left   . RE-EXCISION OF BREAST LUMPECTOMY Left 11/05/2016   Procedure: RE-EXCISION OF LEFT BREAST LUMPECTOMY;  Surgeon: Excell Seltzer, MD;  Location: [place];  Service: General;  Laterality: Left;  . TOTAL KNEE ARTHROPLASTY Left 03/13/2014   Procedure: LEFT TOTAL KNEE  ARTHROPLASTY;  Surgeon: Kerin Salen, MD;  Location: [place];  Service: Orthopedics;  Laterality: Left;  . TOTAL MASTECTOMY Left 11/12/2016   Procedure: LEFT TOTAL MASTECTOMY;  Surgeon: Excell Seltzer, MD;  Location: [place];  Service: General;  Laterality: Left;    FAMILY HISTORY Family History  Problem Relation Age of Onset  . Heart disease Mother   . Emphysema Father   . Cancer Sister        ovarian  The patient's father died at the age of 80 from emphysema. The patient's mother died from "old age" at 11. The patient had one brother, 3 sisters. There is no history of breast cancer in the family. One sister had "a female cancer", but the patient is not sure what it may have been.   GYNECOLOGIC HISTORY:  No LMP recorded. Patient has had a hysterectomy. The patient is status post total abdominal hysterectomy with bilateral salpingo-oophorectomy remotely. She is GX P2, first live birth age 40. She never used oral contraceptives.   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 Her husband Darlene Gregory has been a Psychologist, sport and exercise. They currently have 41 acres. Son Darlene Gregory, 56, lives in [place] and works for [employer]. Son Darlene Gregory lives in [place] and works for [employer]. The patient has 2 grandchildren and some great-grandchildren. She is a Psychologist, forensic.    ADVANCED DIRECTIVES: In place   HEALTH MAINTENANCE: Social History   Tobacco Use  . Smoking status: Former Research scientist (life sciences)  . Smokeless tobacco: Never Used  Vaping Use  . Vaping Use: Never used  Substance Use Topics  . Alcohol use: No  . Drug use: No     Colonoscopy: Repeat due 2019/[place]  PAP:  Bone density: 04/20/2013, T score -2.3   Allergies  Allergen Reactions  . Hyoscyamine Sulfate     REACTION: rash  . Norvasc [Amlodipine Besylate] Swelling  . Pyridium  [Phenazopyridine Hcl]   . Phenazopyridine Hcl Rash  . Sulfonamide Derivatives Rash    Current Outpatient  Medications  Medication Sig Dispense Refill  . aspirin EC 81 MG tablet Take 1 tablet (81 mg total) by mouth daily. 100 tablet 4  . busPIRone (BUSPAR) 5 MG tablet Take 1 tablet (5 mg total) by mouth daily as needed. 30 tablet 1  . Cholecalciferol (VITAMIN D) 2000 UNITS CAPS Take by mouth daily.    . cloNIDine (CATAPRES) 0.1 MG tablet Take 1 tablet (0.1 mg total) by mouth 2 (two) times daily. 180 tablet 1  . Ferrous Sulfate (SLOW FE PO) Take by mouth.    . FIBER PO Take 2 each by mouth daily.     . fish oil-omega-3 fatty acids 1000 MG capsule Take 1 g by mouth daily.    . furosemide (LASIX) 20 MG tablet Take 1 tablet (20 mg total) by mouth daily. 90 tablet 1  . levothyroxine (SYNTHROID) 100 MCG tablet Take 1 tablet (100 mcg total) by mouth daily. 90 tablet 3  . Multiple Vitamins-Minerals (PRESERVISION AREDS PO) Take by mouth.    . olmesartan (BENICAR) 40 MG tablet Take 1 tablet (40 mg total) by mouth daily. 90 tablet 1  . omeprazole (  PRILOSEC) 20 MG capsule Take 1 capsule (20 mg total) by mouth daily. 90 capsule 1  . tamoxifen (NOLVADEX) 20 MG tablet Take 1 tablet by mouth once daily 90 tablet 0  . vitamin C (ASCORBIC ACID) 500 MG tablet Take 500 mg by mouth daily.     No current facility-administered medications for this visit.    OBJECTIVE: White woman who appears stated age 45:   02/05/20 1435  BP: (!) 183/73  Pulse: 63  Resp: 18  Temp: 97.9 F (36.6 C)  SpO2: 98%     Body mass index is 33.07 kg/m.    ECOG FS:1 - Symptomatic but completely ambulatory  Sclerae unicteric, EOMs intact Wearing a mask No cervical or supraclavicular adenopathy Lungs no rales or rhonchi Heart regular rate and rhythm Abd soft, nontender, positive bowel sounds MSK mild kyphosis but no focal spinal tenderness, no upper extremity lymphedema Neuro: nonfocal, well oriented, appropriate affect Breasts: The right breast is unremarkable. The left breast is status post lumpectomy. There is no evidence of  local recurrence. Both axillae are benign.   LAB RESULTS:  CMP     Component Value Date/Time   NA 140 08/23/2019 0901   NA 141 03/14/2017 1056   K 4.3 08/23/2019 0901   K 4.6 03/14/2017 1056   CL 104 08/23/2019 0901   CO2 25 08/23/2019 0901   CO2 28 03/14/2017 1056   GLUCOSE 98 08/23/2019 0901   GLUCOSE 99 09/26/2017 0938   GLUCOSE 90 03/14/2017 1056   BUN 27 08/23/2019 0901   BUN 29.9 (H) 03/14/2017 1056   CREATININE 1.73 (H) 08/23/2019 0901   CREATININE 1.83 (H) 09/26/2017 0938   CREATININE 1.8 (H) 03/14/2017 1056   CALCIUM 9.3 08/23/2019 0901   CALCIUM 9.4 03/14/2017 1056   PROT 6.5 08/23/2019 0901   PROT 6.6 03/14/2017 1056   ALBUMIN 3.9 08/23/2019 0901   ALBUMIN 3.4 (L) 03/14/2017 1056   AST 16 08/23/2019 0901   AST 15 09/26/2017 0938   AST 14 03/14/2017 1056   ALT 11 08/23/2019 0901   ALT 15 09/26/2017 0938   ALT 11 03/14/2017 1056   ALKPHOS 74 08/23/2019 0901   ALKPHOS 69 03/14/2017 1056   BILITOT 0.3 08/23/2019 0901   BILITOT 0.3 09/26/2017 0938   BILITOT 0.38 03/14/2017 1056   GFRNONAA 27 (L) 08/23/2019 0901   GFRNONAA 25 (L) 09/26/2017 0938   GFRNONAA 49 (L) 11/24/2012 0823   GFRAA 31 (L) 08/23/2019 0901   GFRAA 28 (L) 09/26/2017 0938   GFRAA 56 (L) 11/24/2012 0823    No results found for: TOTALPROTELP, ALBUMINELP, A1GS, A2GS, BETS, BETA2SER, GAMS, MSPIKE, SPEI  No results found for: KPAFRELGTCHN, LAMBDASER, [facility]  Lab Results  Component Value Date   WBC 6.3 08/23/2019   NEUTROABS 4.2 08/23/2019   HGB 11.4 08/23/2019   HCT 33.3 (L) 08/23/2019   MCV 96 08/23/2019   PLT 175 08/23/2019      Chemistry      Component Value Date/Time   NA 140 08/23/2019 0901   NA 141 03/14/2017 1056   K 4.3 08/23/2019 0901   K 4.6 03/14/2017 1056   CL 104 08/23/2019 0901   CO2 25 08/23/2019 0901   CO2 28 03/14/2017 1056   BUN 27 08/23/2019 0901   BUN 29.9 (H) 03/14/2017 1056   CREATININE 1.73 (H) 08/23/2019 0901   CREATININE 1.83 (H) 09/26/2017  0938   CREATININE 1.8 (H) 03/14/2017 1056      Component Value Date/Time  CALCIUM 9.3 08/23/2019 0901   CALCIUM 9.4 03/14/2017 1056   ALKPHOS 74 08/23/2019 0901   ALKPHOS 69 03/14/2017 1056   AST 16 08/23/2019 0901   AST 15 09/26/2017 0938   AST 14 03/14/2017 1056   ALT 11 08/23/2019 0901   ALT 15 09/26/2017 0938   ALT 11 03/14/2017 1056   BILITOT 0.3 08/23/2019 0901   BILITOT 0.3 09/26/2017 0938   BILITOT 0.38 03/14/2017 1056       No results found for: LABCA2  No components found for: RCVELF810  No results for input(s): INR in the last 168 hours.  Urinalysis    Component Value Date/Time   APPEARANCEUR Clear 11/27/2015 1132   GLUCOSEU Negative 11/27/2015 1132   BILIRUBINUR Negative 11/27/2015 1132   PROTEINUR Negative 11/27/2015 1132   NITRITE Negative 11/27/2015 1132   LEUKOCYTESUR 1+ (A) 11/27/2015 1132     STUDIES: No results found.   ELIGIBLE FOR AVAILABLE RESEARCH PROTOCOL: no  ASSESSMENT: 86 y.o. [place], [place] woman status post left breast upper outer quadrant biopsy 09/24/2016 for a clinical T1B N0, stage IA invasive ductal carcinoma, grade 2, estrogen and progesterone receptor positive, HER-2 not amplified, with an MIB-1 of 5%  (1) Status post left lumpectomy 10/29/2016 for a pT1c pNX, stage IA invasive ductal carcinoma, grade 2, with positive margins  (a) additional surgery 11/05/2016 removed additional ductal carcinoma in situ, but margins were still positive  (b) left mastectomy with sentinel lymph node sampling 11/12/2016 showed pT1s pN0, with negative margins  (2) adjuvant radiation not indicated  (3) tamoxifen started 11/30/2016.  (a) bone density December 2014 shows a T score of -2.3   PLAN: Emara is now just over 3 years out from definitive surgery for her breast cancer with no evidence of disease recurrence. This is very favorable.  She is tolerating tamoxifen well and the plan is to continue that a total of 5 years.  She has her labs  through her primary care physician and [facility].  She will see me again in a year. She knows to call for any other issue that may develop before that visit  Total encounter time 25 minutes.*   Monzerrat Wellen, Virgie Dad, MD  02/05/20 2:43 PM Medical Oncology and Hematology [facility] [address] [place], [place] [address] Tel. [phone]    Fax. [phone]   I, Wilburn Mylar, am acting as scribe for Dr. Virgie Dad. Eddis Pingleton.   I, Lurline Del MD, have reviewed the above documentation for accuracy and completeness, and I agree with the above.  *Total Encounter Time as defined by the Centers for Medicare and Medicaid Services includes, in addition to the face-to-face time of a patient visit (documented in the note above) non-face-to-face time: obtaining and reviewing outside history, ordering and reviewing medications, tests or procedures, care coordination (communications with other health care professionals or caregivers) and documentation in the medical record.

## 2020-02-21 DIAGNOSIS — Z779 Other contact with and (suspected) exposures hazardous to health: Secondary | ICD-10-CM | POA: Diagnosis not present

## 2020-02-21 DIAGNOSIS — Z124 Encounter for screening for malignant neoplasm of cervix: Secondary | ICD-10-CM | POA: Diagnosis not present

## 2020-02-21 DIAGNOSIS — Z6832 Body mass index (BMI) 32.0-32.9, adult: Secondary | ICD-10-CM | POA: Diagnosis not present

## 2020-02-26 ENCOUNTER — Other Ambulatory Visit: Payer: Self-pay

## 2020-02-26 ENCOUNTER — Encounter: Payer: Self-pay | Admitting: Nurse Practitioner

## 2020-02-26 ENCOUNTER — Ambulatory Visit: Payer: Medicare PPO | Admitting: Nurse Practitioner

## 2020-02-26 ENCOUNTER — Ambulatory Visit (HOSPITAL_COMMUNITY)
Admission: RE | Admit: 2020-02-26 | Discharge: 2020-02-26 | Disposition: A | Discharge status: Home / Self Care | Payer: Medicare PPO | Source: Ambulatory Visit | Attending: Nurse Practitioner | Admitting: Nurse Practitioner

## 2020-02-26 VITALS — BP 197/84 | HR 57 | Temp 98.1°F | Resp 20 | Ht 61.0 in | Wt 174.0 lb

## 2020-02-26 DIAGNOSIS — S0990XA Unspecified injury of head, initial encounter: Secondary | ICD-10-CM | POA: Diagnosis not present

## 2020-02-26 DIAGNOSIS — I10 Essential (primary) hypertension: Secondary | ICD-10-CM

## 2020-02-26 DIAGNOSIS — F411 Generalized anxiety disorder: Secondary | ICD-10-CM

## 2020-02-26 DIAGNOSIS — I6782 Cerebral ischemia: Secondary | ICD-10-CM | POA: Diagnosis not present

## 2020-02-26 DIAGNOSIS — K449 Diaphragmatic hernia without obstruction or gangrene: Secondary | ICD-10-CM

## 2020-02-26 DIAGNOSIS — Z6832 Body mass index (BMI) 32.0-32.9, adult: Secondary | ICD-10-CM

## 2020-02-26 DIAGNOSIS — D509 Iron deficiency anemia, unspecified: Secondary | ICD-10-CM | POA: Diagnosis not present

## 2020-02-26 DIAGNOSIS — R609 Edema, unspecified: Secondary | ICD-10-CM

## 2020-02-26 DIAGNOSIS — E034 Atrophy of thyroid (acquired): Secondary | ICD-10-CM

## 2020-02-26 DIAGNOSIS — K219 Gastro-esophageal reflux disease without esophagitis: Secondary | ICD-10-CM

## 2020-02-26 DIAGNOSIS — K5730 Diverticulosis of large intestine without perforation or abscess without bleeding: Secondary | ICD-10-CM

## 2020-02-26 DIAGNOSIS — M810 Age-related osteoporosis without current pathological fracture: Secondary | ICD-10-CM | POA: Diagnosis not present

## 2020-02-26 DIAGNOSIS — M1009 Idiopathic gout, multiple sites: Secondary | ICD-10-CM

## 2020-02-26 DIAGNOSIS — G9389 Other specified disorders of brain: Secondary | ICD-10-CM | POA: Diagnosis not present

## 2020-02-26 DIAGNOSIS — G44319 Acute post-traumatic headache, not intractable: Secondary | ICD-10-CM | POA: Diagnosis not present

## 2020-02-26 DIAGNOSIS — N1832 Chronic kidney disease, stage 3b: Secondary | ICD-10-CM

## 2020-02-26 MED ORDER — SLOW FE 142 (45 FE) MG PO TBCR: 1.0000 | EXTENDED_RELEASE_TABLET | Freq: Every day | ORAL | 1 refills | Status: DC

## 2020-02-26 MED ORDER — FUROSEMIDE 20 MG PO TABS: 20.0000 mg | ORAL_TABLET | Freq: Every day | ORAL | 1 refills | Status: DC

## 2020-02-26 MED ORDER — CLONIDINE HCL 0.2 MG PO TABS: 0.2000 mg | ORAL_TABLET | Freq: Two times a day (BID) | ORAL | 1 refills | Status: DC

## 2020-02-26 MED ORDER — LEVOTHYROXINE SODIUM 100 MCG PO TABS: 100.0000 ug | ORAL_TABLET | Freq: Every day | ORAL | 1 refills | Status: DC

## 2020-02-26 MED ORDER — OLMESARTAN MEDOXOMIL 40 MG PO TABS: 40.0000 mg | ORAL_TABLET | Freq: Every day | ORAL | 1 refills | Status: DC

## 2020-02-26 MED ORDER — OMEPRAZOLE 20 MG PO CPDR: 20.0000 mg | DELAYED_RELEASE_CAPSULE | Freq: Every day | ORAL | 1 refills | Status: DC

## 2020-02-26 MED ORDER — BUSPIRONE HCL 5 MG PO TABS: 5.0000 mg | ORAL_TABLET | Freq: Every day | ORAL | 1 refills | Status: DC | PRN

## 2020-02-26 NOTE — Patient Instructions (Signed)
Head Injury, Adult There are many types of head injuries. They can be as minor as a bump. Some head injuries can be worse. Worse injuries include:  A strong hit to the head that shakes the brain back and forth causing damage (concussion).  A bruise (contusion) of the brain. This means there is bleeding in the brain that can cause swelling.  A cracked skull (skull fracture).  Bleeding in the brain that gathers, gets thick (makes a clot), and forms a bump (hematoma). Most problems from a head injury come in the first 24 hours. However, you may still have side effects up to 7-10 days after your injury. It is important to watch your condition for any changes. You may need to be watched in the emergency department or urgent care, or you may need to stay in the hospital. What are the causes? There are many possible causes of a head injury. A serious head injury may be caused by:  A car accident.  Bicycle or motorcycle accidents.  Sports injuries.  Falls. What are the signs or symptoms? Symptoms of a head injury include a bruise, bump, or bleeding where the injury happened. Other physical symptoms may include:  Headache.  Feeling sick to your stomach (nauseous) or vomiting.  Dizziness.  Feeling tired.  Being uncomfortable around bright lights or loud noises.  Shaking movements that you cannot control (seizures).  Trouble being woken up.  Passing out (fainting). Mental or emotional symptoms may include:  Feeling grumpy or cranky.  Confusion and memory problems.  Having trouble paying attention or concentrating.  Changes in eating or sleeping habits.  Feeling worried or nervous (anxious).  Feeling sad (depressed). How is this treated? Treatment for this condition depends on how severe the injury is and the type of injury you have. The main goal is to prevent complications and to allow the brain time to heal. Mild head injury If you have a mild head injury, you may be  sent home and treatment may include:  Being watched. A responsible adult should stay with you for 24 hours after your injury and check on you often.  Physical rest.  Brain rest.  Pain medicines. Severe head injury If you have a severe head injury, treatment may include:  Being watched closely. This includes hospitalization with frequent physical exams.  Medicines to: ? Help with pain. ? Prevent shaking movements that you cannot control. ? Help with brain swelling.  Using a machine that helps you breathe (ventilator).  Treatments to manage the swelling inside the brain.  Brain surgery. This may be needed to: ? Remove a blood clot. ? Stop the bleeding. ? Remove a part of the skull. This allows room for the brain to swell. Follow these instructions at home: Activity  Rest.  Avoid activities that are hard or tiring.  Make sure you get enough sleep.  Limit activities that need a lot of thought or attention, such as: ? Watching TV. ? Playing memory games and puzzles. ? Job-related work or homework. ? Working on [employer], and texting.  Avoid activities that could cause another head injury until your doctor says it is okay. This includes playing sports. Having another head injury, especially before the first one has healed, can be dangerous.  Ask your doctor when it is safe for you to go back to your normal activities, such as work or school. Ask your doctor for a step-by-step plan for slowly going back to your normal activities.  Ask  your doctor when you can drive, ride a bicycle, or use heavy machinery. Do not do these activities if you are dizzy. Lifestyle   Do not drink alcohol until your doctor says it is okay.  Do not use drugs.  If it is harder than usual to remember things, write them down.  If you are easily distracted, try to do one thing at a time.  Talk with family members or close friends when making important decisions.  Tell your  friends, family, a trusted coworker, and work Freight forward[employer] about your injury, symptoms, and limits (restrictions). Have them watch for any problems that are new or getting worse. General instructions  Take over-the-counter and prescription medicines only as told by your doctor.  Have someone stay with you for 24 hours after your head injury. This person should watch you for any changes in your symptoms and be ready to get help.  Keep all follow-up visits as told by your doctor. This is important. How is this prevented?  Work on [employer]. This can help you avoid falls.  Wear a seatbelt when you are in a moving vehicle.  Wear a helmet when you: ? Ride a bicycle. ? Ski. ? Do any other sport or activity that has a risk of injury.  If you drink alcohol: ? Limit how much you use to:  0-1 drink a day for women.  0-2 drinks a day for men. ? Be aware of how much alcohol is in your drink. In the [place] one drink equals one 12 oz bottle of beer (355 mL), one 5 oz glass of wine (148 mL), or one 1 oz glass of hard liquor (44 mL).  Make your home safer by: ? Getting rid of clutter from the floors and stairs. This includes things that can make you trip. ? Using grab bars in bathrooms and handrails by stairs. ? Placing non-slip mats on floors and in bathtubs. ? Putting more light in dim areas. Get help right away if:  You have: ? A very bad headache that is not helped by medicine. ? Trouble walking or weakness in your arms and legs. ? Clear or bloody fluid coming from your nose or ears. ? Changes in how you see (vision). ? Shaking movements that you cannot control.  You lose your balance.  You vomit.  The black centers of your eyes (pupils) change in size.  Your speech is slurred.  Your dizziness gets worse.  You pass out.  You are sleepier than normal and have trouble staying awake.  Your symptoms get worse. These symptoms may be an emergency. Do not wait to see  if the symptoms will go away. Get medical help right away. Call your local emergency services (911 in the [place] Do not drive yourself to the hospital. Summary  There are many types of head injuries. They can be as minor as a bump. Some head injuries can be worse  Treatment for this condition depends on how severe the injury is and the type of injury you have.  Ask your doctor when it is safe for you to go back to your normal activities, such as work or school.  To prevent a head injury, wear a seat belt in a car, wear a helmet when you use a a bicycle, limit your alcohol use, and make your home safer. This information is not intended to replace advice given to you by your health care provider. Make sure you discuss any questions you  have with your health care provider. Document Revised: 08/17/2018 Document Reviewed: 05/19/2018 Elsevier Patient Education  [place].

## 2020-02-26 NOTE — Progress Notes (Addendum)
Subjective:    Patient ID: Darlene Gregory, female    DOB: Sep 29, 1933, 86 y.o.   MRN: [id number]   Chief Complaint: Medical Management of Chronic Issues    HPI:  1. Essential hypertension, benign No c/o chest pain, sob or headache. Does not check blood pressure at home. BP Readings from Last 3 Encounters:  02/26/20 (!) 197/84  02/05/20 (!) 183/73  08/23/19 (!) 144/66      2. Diaphragmatic hernia without obstruction and without gangrene She says she has no issues unless she over eats  3. Gastroesophageal reflux disease without esophagitis Is on omeprazole daily without issues  4. Diverticulosis of colon Declines any recent flare ups  5. Hypothyroidism due to acquired atrophy of thyroid No problems that she is aware of. Lab Results  Component Value Date   TSH 2.900 09/18/2019     6. Age-related osteoporosis without current pathological fracture Last dexascan was done 2014. Patient has refused repeat in past. She denies any back pain. Does very little weight bearing exercises.  7. Stage 3b chronic kidney disease ([place]) No problems voiding. Lab Results  Component Value Date   CREATININE 1.73 (H) 08/23/2019     8. Iron deficiency anemia, unspecified iron deficiency anemia type Denies any fatigue. Lab Results  Component Value Date   HGB 11.4 08/23/2019     9. Acute idiopathic gout of multiple sites Denies any recent gout flare ups.  10. GAD (generalized anxiety disorder) She is current;y on buspar for her anxiety. She does not take it everyday. Only takes 1-2 x a month.  GAD 7 : Generalized Anxiety Score 02/26/2020 05/24/2019 12/09/2017  Nervous, Anxious, on Edge 1 1 0  Control/stop worrying 3 0 0  Worry too much - different things 3 0 0  Trouble relaxing 0 0 0  Restless 0 0 0  Easily annoyed or irritable 0 0 0  Afraid - awful might happen 0 0 0  Total GAD 7 Score 7 1 0  Anxiety Difficulty Not difficult at all Not difficult at all Not difficult at all        11. BMI 32.0-32.9,adult No recent weight changes  Wt Readings from Last 3 Encounters:  02/26/20 174 lb (78.9 kg)  02/05/20 175 lb (79.4 kg)  08/23/19 180 lb (81.6 kg)   BMI Readings from Last 3 Encounters:  02/26/20 32.88 kg/m  02/05/20 33.07 kg/m  08/23/19 34.01 kg/m       Outpatient Encounter Medications as of 02/26/2020  Medication Sig  . aspirin EC 81 MG tablet Take 1 tablet (81 mg total) by mouth daily.  . busPIRone (BUSPAR) 5 MG tablet Take 1 tablet (5 mg total) by mouth daily as needed.  . Cholecalciferol (VITAMIN D) 2000 UNITS CAPS Take by mouth daily.  . cloNIDine (CATAPRES) 0.1 MG tablet Take 1 tablet (0.1 mg total) by mouth 2 (two) times daily.  . Ferrous Sulfate (SLOW FE PO) Take by mouth.  . FIBER PO Take 2 each by mouth daily.   . fish oil-omega-3 fatty acids 1000 MG capsule Take 1 g by mouth daily.  . furosemide (LASIX) 20 MG tablet Take 1 tablet (20 mg total) by mouth daily.  Marland Kitchen levothyroxine (SYNTHROID) 100 MCG tablet Take 1 tablet (100 mcg total) by mouth daily.  . Multiple Vitamins-Minerals (PRESERVISION AREDS PO) Take by mouth.  . olmesartan (BENICAR) 40 MG tablet Take 1 tablet (40 mg total) by mouth daily.  Marland Kitchen omeprazole (PRILOSEC) 20 MG capsule Take 1 capsule (20  mg total) by mouth daily.  . tamoxifen (NOLVADEX) 20 MG tablet Take 1 tablet (20 mg total) by mouth daily.  . vitamin C (ASCORBIC ACID) 500 MG tablet Take 500 mg by mouth daily.   No facility-administered encounter medications on file as of 02/26/2020.    Past Surgical History:  Procedure Laterality Date  . ABDOMINAL HYSTERECTOMY    . BREAST LUMPECTOMY WITH RADIOACTIVE SEED LOCALIZATION Left 10/29/2016   Procedure: LEFT BREAST LUMPECTOMY WITH RADIOACTIVE SEED LOCALIZATION;  Surgeon: Excell Seltzer, MD;  Location: [facility];  Service: General;  Laterality: Left;  . CAROTID BODY TUMOR EXCISION    . COLONOSCOPY W/ POLYPECTOMY    . EYE SURGERY Bilateral     cataract removed  . JOINT REPLACEMENT Right 2006   knee  . MASTECTOMY Left 2018  . PAROTIDECTOMY Left   . RE-EXCISION OF BREAST LUMPECTOMY Left 11/05/2016   Procedure: RE-EXCISION OF LEFT BREAST LUMPECTOMY;  Surgeon: Excell Seltzer, MD;  Location: [facility];  Service: General;  Laterality: Left;  . TOTAL KNEE ARTHROPLASTY Left 03/13/2014   Procedure: LEFT TOTAL KNEE ARTHROPLASTY;  Surgeon: Kerin Salen, MD;  Location: [facility];  Service: Orthopedics;  Laterality: Left;  . TOTAL MASTECTOMY Left 11/12/2016   Procedure: LEFT TOTAL MASTECTOMY;  Surgeon: Excell Seltzer, MD;  Location: [facility];  Service: General;  Laterality: Left;    Family History  Problem Relation Age of Onset  . Heart disease Mother   . Emphysema Father   . Cancer Sister        ovarian    New complaints: Patient fell Saturday and hit her head. It did not knock her out, but she says she does not feel right. Has slight headache with visual changes. Rates headache 6-7/10 currently. Tylenol helps. Denies nausea and vomiting.  Social history: Her husband is now bedridden and sh eis having to take care of him.  Controlled substance contract: n/a     Review of Systems  Constitutional: Negative for diaphoresis.  Eyes: Negative for pain.  Respiratory: Negative for shortness of breath.   Cardiovascular: Negative for chest pain, palpitations and leg swelling.  Gastrointestinal: Negative for abdominal pain.  Endocrine: Negative for polydipsia.  Skin: Negative for rash.  Neurological: Negative for dizziness, weakness and headaches.  Hematological: Does not bruise/bleed easily.  All other systems reviewed and are negative.      Objective:   Physical Exam Vitals and nursing note reviewed.  Constitutional:      General: She is not in acute distress.    Appearance: Normal appearance. She is well-developed.  HENT:     Head: Normocephalic.     Nose: Nose normal.  Eyes:      Pupils: Pupils are equal, round, and reactive to light.  Neck:     Vascular: No carotid bruit or JVD.  Cardiovascular:     Rate and Rhythm: Normal rate and regular rhythm.     Heart sounds: Normal heart sounds.  Pulmonary:     Effort: Pulmonary effort is normal. No respiratory distress.     Breath sounds: Normal breath sounds. No wheezing or rales.  Chest:     Chest wall: No tenderness.  Abdominal:     General: Bowel sounds are normal. There is no distension or abdominal bruit.     Palpations: Abdomen is soft. There is no hepatomegaly, splenomegaly, mass or pulsatile mass.     Tenderness: There is no abdominal tenderness.  Musculoskeletal:  General: Normal range of motion.     Cervical back: Normal range of motion and neck supple.     Right lower leg: Edema (1+) present.     Left lower leg: Edema (1+) present.  Lymphadenopathy:     Cervical: No cervical adenopathy.  Skin:    General: Skin is warm and dry.  Neurological:     Mental Status: She is alert and oriented to person, place, and time.     Deep Tendon Reflexes: Reflexes are normal and symmetric.  Psychiatric:        Behavior: Behavior normal.        Thought Content: Thought content normal.        Judgment: Judgment normal.    BP (!) 197/84   Pulse (!) 57   Temp 98.1 F (36.7 C) (Temporal)   Resp 20   Ht $R'5\' 1"'wy$  (1.549 m)   Wt 174 lb (78.9 kg)   SpO2 99%   BMI 32.88 kg/m        Assessment & Plan:  KERINGTON HILDEBRANT comes in today with chief complaint of Medical Management of Chronic Issues (Fall last Saturday and hit head)   Diagnosis and orders addressed:  1. Essential hypertension, benign Low sodium diet increaased clonidine from 0.$RemoveBeforeDE'1mg'lDivguSHqNMfgOE$  to 0.$Rem'2mg'lpaY$  bid - cloNIDine (CATAPRES) 0.2 MG tablet; Take 1 tablet (0.2 mg total) by mouth 2 (two) times daily.  Dispense: 180 tablet; Refill: 1 - olmesartan (BENICAR) 40 MG tablet; Take 1 tablet (40 mg total) by mouth daily.  Dispense: 90 tablet; Refill: 1 - CMP14+EGFR -  Lipid panel  2. Diaphragmatic hernia without obstruction and without gangrene Do not over eat  3. Gastroesophageal reflux disease without esophagitis Avoid spicy foods Do not eat 2 hours prior to bedtime - omeprazole (PRILOSEC) 20 MG capsule; Take 1 capsule (20 mg total) by mouth daily.  Dispense: 90 capsule; Refill: 1  4. Diverticulosis of colon Watch diet to prevent flare up  5. Hypothyroidism due to acquired atrophy of thyroid Labs pending - levothyroxine (SYNTHROID) 100 MCG tablet; Take 1 tablet (100 mcg total) by mouth daily.  Dispense: 90 tablet; Refill: 1 - Thyroid Panel With TSH  6. Age-related osteoporosis without current pathological fracture Weight bearing exercises encouraged when able  7. Stage 3b chronic kidney disease ([place]) Labs pending  8. Iron deficiency anemia, unspecified iron deficiency anemia type Labs pending - Ferrous Sulfate (SLOW FE) 142 (45 Fe) MG TBCR; Take 1 tablet by mouth daily.  Dispense: 90 tablet; Refill: 1 - CBC with Differential/Platelet  9. Acute idiopathic gout of multiple sites Low purine diet  10. GAD (generalized anxiety disorder) Stress management - busPIRone (BUSPAR) 5 MG tablet; Take 1 tablet (5 mg total) by mouth daily as needed.  Dispense: 30 tablet; Refill: 1  11. BMI 32.0-32.9,adult Discussed diet and exercise for person with BMI >25 Will recheck weight in 3-6 months  12. Peripheral edema Elevate legs when sitting - furosemide (LASIX) 20 MG tablet; Take 1 tablet (20 mg total) by mouth daily.  Dispense: 90 tablet; Refill: 1  13. Acute post-traumatic headache, not intractable Patient requested head CT because she just does not feel right- Cranial nerve check was negative. - CT Head Wo Contrast; Future   Labs pending Health Maintenance reviewed Diet and exercise encouraged  Follow up plan: 3 months   [name], FNP

## 2020-02-27 LAB — CBC WITH DIFFERENTIAL/PLATELET
Basophils Absolute: 0 10*3/uL (ref 0.0–0.2)
Basos: 1 %
EOS (ABSOLUTE): 0.3 10*3/uL (ref 0.0–0.4)
Eos: 4 %
Hematocrit: 36.6 % (ref 34.0–46.6)
Hemoglobin: 11.7 g/dL (ref 11.1–15.9)
Immature Grans (Abs): 0 10*3/uL (ref 0.0–0.1)
Immature Granulocytes: 0 %
Lymphocytes Absolute: 1.6 10*3/uL (ref 0.7–3.1)
Lymphs: 24 %
MCH: 31.3 pg (ref 26.6–33.0)
MCHC: 32 g/dL (ref 31.5–35.7)
MCV: 98 fL — ABNORMAL HIGH (ref 79–97)
Monocytes Absolute: 0.6 10*3/uL (ref 0.1–0.9)
Monocytes: 8 %
Neutrophils Absolute: 4.4 10*3/uL (ref 1.4–7.0)
Neutrophils: 63 %
Platelets: 179 10*3/uL (ref 150–450)
RBC: 3.74 x10E6/uL — ABNORMAL LOW (ref 3.77–5.28)
RDW: 12 % (ref 11.7–15.4)
WBC: 7 10*3/uL (ref 3.4–10.8)

## 2020-02-27 LAB — THYROID PANEL WITH TSH
Free Thyroxine Index: 3.3 (ref 1.2–4.9)
T3 Uptake Ratio: 31 % (ref 24–39)
T4, Total: 10.7 ug/dL (ref 4.5–12.0)
TSH: 2.46 u[IU]/mL (ref 0.450–4.500)

## 2020-02-27 LAB — CMP14+EGFR
ALT: 13 IU/L (ref 0–32)
AST: 15 IU/L (ref 0–40)
Albumin/Globulin Ratio: 1.6 (ref 1.2–2.2)
Albumin: 4.1 g/dL (ref 3.6–4.6)
Alkaline Phosphatase: 82 IU/L (ref 44–121)
BUN/Creatinine Ratio: 17 (ref 12–28)
BUN: 28 mg/dL — ABNORMAL HIGH (ref 8–27)
Bilirubin Total: 0.3 mg/dL (ref 0.0–1.2)
CO2: 24 mmol/L (ref 20–29)
Calcium: 9.4 mg/dL (ref 8.7–10.3)
Chloride: 104 mmol/L (ref 96–106)
Creatinine, Ser: 1.63 mg/dL — ABNORMAL HIGH (ref 0.57–1.00)
GFR calc Af Amer: 33 mL/min/{1.73_m2} — ABNORMAL LOW (ref >59)
GFR calc non Af Amer: 29 mL/min/{1.73_m2} — ABNORMAL LOW (ref >59)
Globulin, Total: 2.5 g/dL (ref 1.5–4.5)
Glucose: 103 mg/dL — ABNORMAL HIGH (ref 65–99)
Potassium: 4.1 mmol/L (ref 3.5–5.2)
Sodium: 143 mmol/L (ref 134–144)
Total Protein: 6.6 g/dL (ref 6.0–8.5)

## 2020-02-27 LAB — LIPID PANEL
Chol/HDL Ratio: 2 ratio (ref 0.0–4.4)
Cholesterol, Total: 139 mg/dL (ref 100–199)
HDL: 71 mg/dL (ref >39)
LDL Chol Calc (NIH): 53 mg/dL (ref 0–99)
Triglycerides: 78 mg/dL (ref 0–149)
VLDL Cholesterol Cal: 15 mg/dL (ref 5–40)

## 2020-05-06 DIAGNOSIS — C50912 Malignant neoplasm of unspecified site of left female breast: Secondary | ICD-10-CM | POA: Diagnosis not present

## 2020-05-22 ENCOUNTER — Other Ambulatory Visit: Payer: Self-pay | Admitting: Nurse Practitioner

## 2020-05-22 DIAGNOSIS — F411 Generalized anxiety disorder: Secondary | ICD-10-CM

## 2020-05-22 DIAGNOSIS — I10 Essential (primary) hypertension: Secondary | ICD-10-CM

## 2020-05-26 ENCOUNTER — Other Ambulatory Visit: Payer: Self-pay | Admitting: Nurse Practitioner

## 2020-05-26 DIAGNOSIS — I10 Essential (primary) hypertension: Secondary | ICD-10-CM

## 2020-07-02 ENCOUNTER — Other Ambulatory Visit: Payer: Self-pay | Admitting: Nurse Practitioner

## 2020-07-02 DIAGNOSIS — K219 Gastro-esophageal reflux disease without esophagitis: Secondary | ICD-10-CM

## 2020-07-07 ENCOUNTER — Telehealth: Payer: Self-pay

## 2020-07-07 DIAGNOSIS — I10 Essential (primary) hypertension: Secondary | ICD-10-CM

## 2020-07-07 MED ORDER — OLMESARTAN MEDOXOMIL 40 MG PO TABS: 40.0000 mg | ORAL_TABLET | Freq: Every day | ORAL | 0 refills | Status: DC

## 2020-07-07 NOTE — Telephone Encounter (Signed)
Pt aware refill of Olmesartan sent to pharmacy

## 2020-08-08 ENCOUNTER — Other Ambulatory Visit: Payer: Self-pay | Admitting: Family Medicine

## 2020-08-08 DIAGNOSIS — I10 Essential (primary) hypertension: Secondary | ICD-10-CM

## 2020-08-26 ENCOUNTER — Ambulatory Visit: Payer: Self-pay | Admitting: Nurse Practitioner

## 2020-08-27 ENCOUNTER — Encounter: Payer: Self-pay | Admitting: Nurse Practitioner

## 2020-08-27 ENCOUNTER — Other Ambulatory Visit: Payer: Self-pay

## 2020-08-27 ENCOUNTER — Ambulatory Visit: Payer: Medicare PPO | Admitting: Nurse Practitioner

## 2020-08-27 ENCOUNTER — Other Ambulatory Visit: Payer: Self-pay | Admitting: Nurse Practitioner

## 2020-08-27 VITALS — BP 166/78 | HR 56 | Temp 98.3°F | Resp 20 | Ht 61.0 in | Wt 171.0 lb

## 2020-08-27 DIAGNOSIS — F411 Generalized anxiety disorder: Secondary | ICD-10-CM | POA: Diagnosis not present

## 2020-08-27 DIAGNOSIS — M7989 Other specified soft tissue disorders: Secondary | ICD-10-CM

## 2020-08-27 DIAGNOSIS — D509 Iron deficiency anemia, unspecified: Secondary | ICD-10-CM

## 2020-08-27 DIAGNOSIS — M810 Age-related osteoporosis without current pathological fracture: Secondary | ICD-10-CM | POA: Diagnosis not present

## 2020-08-27 DIAGNOSIS — E034 Atrophy of thyroid (acquired): Secondary | ICD-10-CM

## 2020-08-27 DIAGNOSIS — K5730 Diverticulosis of large intestine without perforation or abscess without bleeding: Secondary | ICD-10-CM | POA: Diagnosis not present

## 2020-08-27 DIAGNOSIS — K219 Gastro-esophageal reflux disease without esophagitis: Secondary | ICD-10-CM

## 2020-08-27 DIAGNOSIS — I10 Essential (primary) hypertension: Secondary | ICD-10-CM | POA: Diagnosis not present

## 2020-08-27 DIAGNOSIS — Z6832 Body mass index (BMI) 32.0-32.9, adult: Secondary | ICD-10-CM

## 2020-08-27 DIAGNOSIS — N1832 Chronic kidney disease, stage 3b: Secondary | ICD-10-CM | POA: Diagnosis not present

## 2020-08-27 DIAGNOSIS — R609 Edema, unspecified: Secondary | ICD-10-CM

## 2020-08-27 MED ORDER — LEVOTHYROXINE SODIUM 100 MCG PO TABS: 100.0000 ug | ORAL_TABLET | Freq: Every day | ORAL | 1 refills | Status: DC

## 2020-08-27 MED ORDER — BUSPIRONE HCL 5 MG PO TABS: 5.0000 mg | ORAL_TABLET | Freq: Every day | ORAL | 0 refills | Status: DC | PRN

## 2020-08-27 MED ORDER — CLONIDINE HCL 0.2 MG PO TABS: 0.2000 mg | ORAL_TABLET | Freq: Two times a day (BID) | ORAL | 1 refills | Status: DC

## 2020-08-27 MED ORDER — OMEPRAZOLE 20 MG PO CPDR: 1.0000 | DELAYED_RELEASE_CAPSULE | Freq: Every day | ORAL | 1 refills | Status: DC

## 2020-08-27 MED ORDER — FUROSEMIDE 20 MG PO TABS: 20.0000 mg | ORAL_TABLET | Freq: Every day | ORAL | 1 refills | Status: DC

## 2020-08-27 MED ORDER — OLMESARTAN MEDOXOMIL 40 MG PO TABS: 40.0000 mg | ORAL_TABLET | Freq: Every day | ORAL | 1 refills | Status: DC

## 2020-08-27 MED ORDER — SLOW FE 142 (45 FE) MG PO TBCR: 1.0000 | EXTENDED_RELEASE_TABLET | Freq: Every day | ORAL | 1 refills | Status: DC

## 2020-08-27 NOTE — Addendum Note (Signed)
Addended by: Rolena Infante on: 08/27/2020 10:37 AM   Modules accepted: Orders

## 2020-08-27 NOTE — Patient Instructions (Signed)
Edema  Edema is when you have too much fluid in your body or under your skin. Edema may make your legs, feet, and ankles swell up. Swelling is also common in looser tissues, like around your eyes. This is a common condition. It gets more common as you get older. There are many possible causes of edema. Eating too much salt (sodium) and being on your feet or sitting for a long time can cause edema in your legs, feet, and ankles. Hot weather may make edema worse. Edema is usually painless. Your skin may look swollen or shiny. Follow these instructions at home:  Keep the swollen body part raised (elevated) above the level of your heart when you are sitting or lying down.  Do not sit still or stand for a long time.  Do not wear tight clothes. Do not wear garters on your upper legs.  Exercise your legs. This can help the swelling go down.  Wear elastic bandages or support stockings as told by your doctor.  Eat a low-salt (low-sodium) diet to reduce fluid as told by your doctor.  Depending on the cause of your swelling, you may need to limit how much fluid you drink (fluid restriction).  Take over-the-counter and prescription medicines only as told by your doctor. Contact a doctor if:  Treatment is not working.  You have heart, liver, or kidney disease and have symptoms of edema.  You have sudden and unexplained weight gain. Get help right away if:  You have shortness of breath or chest pain.  You cannot breathe when you lie down.  You have pain, redness, or warmth in the swollen areas.  You have heart, liver, or kidney disease and get edema all of a sudden.  You have a fever and your symptoms get worse all of a sudden. Summary  Edema is when you have too much fluid in your body or under your skin.  Edema may make your legs, feet, and ankles swell up. Swelling is also common in looser tissues, like around your eyes.  Raise (elevate) the swollen body part above the level of your  heart when you are sitting or lying down.  Follow your doctor's instructions about diet and how much fluid you can drink (fluid restriction). This information is not intended to replace advice given to you by your health care provider. Make sure you discuss any questions you have with your health care provider. Document Revised: 02/20/2020 Document Reviewed: 02/20/2020 Elsevier Patient Education  2021 Elsevier Inc.  

## 2020-08-27 NOTE — Progress Notes (Signed)
Subjective:    Patient ID: Darlene Gregory, female    DOB: [birth year], 87 y.o.   MRN: [id number]   Chief Complaint: medical management of chronic issues     HPI:  1. Essential hypertension, benign No c/o chest pain, sob or headche. Does check blood pressure at home on occasion. She says her blood pressure is always up in the office. BP Readings from Last 3 Encounters:  02/26/20 (!) 197/84  02/05/20 (!) 183/73  08/23/19 (!) 144/66     2. Hypothyroidism due to acquired atrophy of thyroid No problems that she is aware of. Lab Results  Component Value Date   TSH 2.460 02/26/2020     3. Gastroesophageal reflux disease without esophagitis Takes omeprazole daily and that works well for her.  4. Diverticulosis of colon No recent flare ups  5. Stage 3b chronic kidney disease ([facility]) No voiding issues Lab Results  Component Value Date   CREATININE 1.63 (H) 02/26/2020     6. Iron deficiency anemia, unspecified iron deficiency anemia type No c/o fatigue Lab Results  Component Value Date   HGB 11.7 02/26/2020     7. GAD (generalized anxiety disorder) Has anxiety daily. She is a Advertising copywriter. Takes buspar only as needed and says she does not take it very often. GAD 7 : Generalized Anxiety Score 08/27/2020 02/26/2020 05/24/2019 12/09/2017  Nervous, Anxious, on Edge 0 1 1 0  Control/stop worrying 0 3 0 0  Worry too much - different things 0 3 0 0  Trouble relaxing 0 0 0 0  Restless 0 0 0 0  Easily annoyed or irritable 0 0 0 0  Afraid - awful might happen 0 0 0 0  Total GAD 7 Score 0 7 1 0  Anxiety Difficulty Not difficult at all Not difficult at all Not difficult at all Not difficult at all     8. Age-related osteoporosis without current pathological fracture Last dexascan was done in 2015. Patient refuses to repeat at her age.  9. BMI 32.0-32.9,adult No recent weight changes. Wt Readings from Last 3 Encounters:  08/27/20 171 lb (77.6 kg)  02/26/20 174 lb (78.9 kg)   02/05/20 175 lb (79.4 kg)   BMI Readings from Last 3 Encounters:  08/27/20 32.31 kg/m  02/26/20 32.88 kg/m  02/05/20 33.07 kg/m      Outpatient Encounter Medications as of 08/27/2020  Medication Sig  . aspirin EC 81 MG tablet Take 1 tablet (81 mg total) by mouth daily.  . busPIRone (BUSPAR) 5 MG tablet TAKE 1 TABLET BY MOUTH ONCE DAILY AS NEEDED  . Cholecalciferol (VITAMIN D) 2000 UNITS CAPS Take by mouth daily.  . cloNIDine (CATAPRES) 0.1 MG tablet Take 1 tablet by mouth twice daily  . cloNIDine (CATAPRES) 0.2 MG tablet Take 1 tablet (0.2 mg total) by mouth 2 (two) times daily.  . Ferrous Sulfate (SLOW FE) 142 (45 Fe) MG TBCR Take 1 tablet by mouth daily.  Marland Kitchen FIBER PO Take 2 each by mouth daily.   . fish oil-omega-3 fatty acids 1000 MG capsule Take 1 g by mouth daily.  . furosemide (LASIX) 20 MG tablet Take 1 tablet (20 mg total) by mouth daily.  Marland Kitchen levothyroxine (SYNTHROID) 100 MCG tablet Take 1 tablet (100 mcg total) by mouth daily.  . Multiple Vitamins-Minerals (PRESERVISION AREDS PO) Take by mouth.  . olmesartan (BENICAR) 40 MG tablet Take 1 tablet (40 mg total) by mouth daily.  Marland Kitchen omeprazole (PRILOSEC) 20 MG capsule Take 1 capsule  by mouth once daily  . tamoxifen (NOLVADEX) 20 MG tablet Take 1 tablet (20 mg total) by mouth daily.  . vitamin C (ASCORBIC ACID) 500 MG tablet Take 500 mg by mouth daily.   No facility-administered encounter medications on file as of 08/27/2020.    Past Surgical History:  Procedure Laterality Date  . ABDOMINAL HYSTERECTOMY    . BREAST LUMPECTOMY WITH RADIOACTIVE SEED LOCALIZATION Left 10/29/2016   Procedure: LEFT BREAST LUMPECTOMY WITH RADIOACTIVE SEED LOCALIZATION;  Surgeon: Excell Seltzer, MD;  Location: [facility];  Service: General;  Laterality: Left;  . CAROTID BODY TUMOR EXCISION    . COLONOSCOPY W/ POLYPECTOMY    . EYE SURGERY Bilateral    cataract removed  . JOINT REPLACEMENT Right 2006   knee  . MASTECTOMY Left  2018  . PAROTIDECTOMY Left   . RE-EXCISION OF BREAST LUMPECTOMY Left 11/05/2016   Procedure: RE-EXCISION OF LEFT BREAST LUMPECTOMY;  Surgeon: Excell Seltzer, MD;  Location: [facility];  Service: General;  Laterality: Left;  . TOTAL KNEE ARTHROPLASTY Left 03/13/2014   Procedure: LEFT TOTAL KNEE ARTHROPLASTY;  Surgeon: Kerin Salen, MD;  Location: [facility];  Service: Orthopedics;  Laterality: Left;  . TOTAL MASTECTOMY Left 11/12/2016   Procedure: LEFT TOTAL MASTECTOMY;  Surgeon: Excell Seltzer, MD;  Location: [facility];  Service: General;  Laterality: Left;    Family History  Problem Relation Age of Onset  . Heart disease Mother   . Emphysema Father   . Cancer Sister        ovarian    New complaints: None today  Social history: Lives with her husband. She is his care giver  Controlled substance contract: n/a    Review of Systems  Constitutional: Negative for diaphoresis.  Eyes: Negative for pain.  Respiratory: Negative for shortness of breath.   Cardiovascular: Negative for chest pain, palpitations and leg swelling.  Gastrointestinal: Negative for abdominal pain.  Endocrine: Negative for polydipsia.  Skin: Negative for rash.  Neurological: Negative for dizziness, weakness and headaches.  Hematological: Does not bruise/bleed easily.  All other systems reviewed and are negative.      Objective:   Physical Exam Vitals and nursing note reviewed.  Constitutional:      General: She is not in acute distress.    Appearance: Normal appearance. She is well-developed.  HENT:     Head: Normocephalic.     Nose: Nose normal.  Eyes:     Pupils: Pupils are equal, round, and reactive to light.  Neck:     Vascular: No carotid bruit or JVD.  Cardiovascular:     Rate and Rhythm: Normal rate and regular rhythm.     Heart sounds: Normal heart sounds.  Pulmonary:     Effort: Pulmonary effort is normal. No respiratory distress.     Breath sounds:  Normal breath sounds. No wheezing or rales.  Chest:     Chest wall: No tenderness.  Breasts:     Right: Normal.     Left: Normal.        Comments: Right nontender axillary mass Abdominal:     General: Bowel sounds are normal. There is no distension or abdominal bruit.     Palpations: Abdomen is soft. There is no hepatomegaly, splenomegaly, mass or pulsatile mass.     Tenderness: There is no abdominal tenderness.  Musculoskeletal:        General: Normal range of motion.     Cervical back: Normal range of  motion and neck supple.  Lymphadenopathy:     Cervical: No cervical adenopathy.  Skin:    General: Skin is warm and dry.  Neurological:     Mental Status: She is alert and oriented to person, place, and time.     Deep Tendon Reflexes: Reflexes are normal and symmetric.  Psychiatric:        Behavior: Behavior normal.        Thought Content: Thought content normal.        Judgment: Judgment normal.     BP (!) 166/78   Pulse (!) 56   Temp 98.3 F (36.8 C) (Temporal)   Resp 20   Ht 5\' 1"  (1.549 m)   Wt 171 lb (77.6 kg)   SpO2 99%   BMI 32.31 kg/m         Assessment & Plan:  Darlene Gregory comes in today with chief complaint of Medical Management of Chronic Issues (Check place under right arm/)   Diagnosis and orders addressed:  1. Essential hypertension, benign Low sodium diet - olmesartan (BENICAR) 40 MG tablet; Take 1 tablet (40 mg total) by mouth daily.  Dispense: 90 tablet; Refill: 1 - cloNIDine (CATAPRES) 0.2 MG tablet; Take 1 tablet (0.2 mg total) by mouth 2 (two) times daily.  Dispense: 180 tablet; Refill: 1  2. Hypothyroidism due to acquired atrophy of thyroid lbas pending - levothyroxine (SYNTHROID) 100 MCG tablet; Take 1 tablet (100 mcg total) by mouth daily.  Dispense: 90 tablet; Refill: 1  3. Gastroesophageal reflux disease without esophagitis Avoid spicy foods Do not eat 2 hours prior to bedtime - omeprazole (PRILOSEC) 20 MG capsule; Take 1  capsule (20 mg total) by mouth daily.  Dispense: 90 capsule; Refill: 1  4. Diverticulosis of colon  5. Stage 3b chronic kidney disease ([place]) Labs pending  6. Iron deficiency anemia, unspecified iron deficiency anemia type - Ferrous Sulfate (SLOW FE) 142 (45 Fe) MG TBCR; Take 1 tablet by mouth daily.  Dispense: 90 tablet; Refill: 1  7. GAD (generalized anxiety disorder) Stress management - busPIRone (BUSPAR) 5 MG tablet; Take 1 tablet (5 mg total) by mouth daily as needed.  Dispense: 30 tablet; Refill: 0  8. Age-related osteoporosis without current pathological fracture Weight bearing exercise  9. BMI 32.0-32.9,adult Discussed diet and exercise for person with BMI >25 Will recheck weight in 3-6 months  10. Peripheral edema Elevate legs when swelling - furosemide (LASIX) 20 MG tablet; Take 1 tablet (20 mg total) by mouth daily.  Dispense: 90 tablet; Refill: 1  11. Axillary mass Ordered u/s    Labs pending Health Maintenance reviewed Diet and exercise encouraged  Follow up plan: 6 months   [place], FNP

## 2020-08-27 NOTE — Addendum Note (Signed)
Addended by: Chevis Pretty on: 08/27/2020 09:20 AM   Modules accepted: Orders

## 2020-08-28 LAB — CMP14+EGFR
ALT: 10 IU/L (ref 0–32)
AST: 8 IU/L (ref 0–40)
Albumin/Globulin Ratio: 1.8 (ref 1.2–2.2)
Albumin: 4.1 g/dL (ref 3.6–4.6)
Alkaline Phosphatase: 76 IU/L (ref 44–121)
BUN/Creatinine Ratio: 15 (ref 12–28)
BUN: 24 mg/dL (ref 8–27)
Bilirubin Total: 0.3 mg/dL (ref 0.0–1.2)
CO2: 23 mmol/L (ref 20–29)
Calcium: 9.1 mg/dL (ref 8.7–10.3)
Chloride: 103 mmol/L (ref 96–106)
Creatinine, Ser: 1.55 mg/dL — ABNORMAL HIGH (ref 0.57–1.00)
Globulin, Total: 2.3 g/dL (ref 1.5–4.5)
Glucose: 100 mg/dL — ABNORMAL HIGH (ref 65–99)
Potassium: 4.4 mmol/L (ref 3.5–5.2)
Sodium: 141 mmol/L (ref 134–144)
Total Protein: 6.4 g/dL (ref 6.0–8.5)
eGFR: 33 mL/min/{1.73_m2} — ABNORMAL LOW (ref >59)

## 2020-08-28 LAB — CBC WITH DIFFERENTIAL/PLATELET
Basophils Absolute: 0.1 10*3/uL (ref 0.0–0.2)
Basos: 1 %
EOS (ABSOLUTE): 0.2 10*3/uL (ref 0.0–0.4)
Eos: 3 %
Hematocrit: 36.2 % (ref 34.0–46.6)
Hemoglobin: 11.9 g/dL (ref 11.1–15.9)
Immature Grans (Abs): 0 10*3/uL (ref 0.0–0.1)
Immature Granulocytes: 0 %
Lymphocytes Absolute: 1.8 10*3/uL (ref 0.7–3.1)
Lymphs: 22 %
MCH: 31.9 pg (ref 26.6–33.0)
MCHC: 32.9 g/dL (ref 31.5–35.7)
MCV: 97 fL (ref 79–97)
Monocytes Absolute: 0.6 10*3/uL (ref 0.1–0.9)
Monocytes: 7 %
Neutrophils Absolute: 5.5 10*3/uL (ref 1.4–7.0)
Neutrophils: 67 %
Platelets: 165 10*3/uL (ref 150–450)
RBC: 3.73 x10E6/uL — ABNORMAL LOW (ref 3.77–5.28)
RDW: 12.2 % (ref 11.7–15.4)
WBC: 8.1 10*3/uL (ref 3.4–10.8)

## 2020-08-28 LAB — THYROID PANEL WITH TSH
Free Thyroxine Index: 3 (ref 1.2–4.9)
T3 Uptake Ratio: 29 % (ref 24–39)
T4, Total: 10.2 ug/dL (ref 4.5–12.0)
TSH: 1.44 u[IU]/mL (ref 0.450–4.500)

## 2020-08-28 LAB — LIPID PANEL
Chol/HDL Ratio: 2 ratio (ref 0.0–4.4)
Cholesterol, Total: 145 mg/dL (ref 100–199)
HDL: 72 mg/dL (ref >39)
LDL Chol Calc (NIH): 61 mg/dL (ref 0–99)
Triglycerides: 56 mg/dL (ref 0–149)
VLDL Cholesterol Cal: 12 mg/dL (ref 5–40)

## 2020-09-02 ENCOUNTER — Other Ambulatory Visit: Payer: Self-pay

## 2020-09-02 DIAGNOSIS — M7989 Other specified soft tissue disorders: Secondary | ICD-10-CM

## 2020-09-03 ENCOUNTER — Telehealth: Payer: Self-pay

## 2020-09-03 NOTE — Telephone Encounter (Signed)
REFERRAL REQUEST Telephone Note  Have you been seen at our office for this problem? yes (Advise that they may need an appointment with their PCP before a referral can be done)  Reason for Referral: Mammogram? Or MRI? Referral discussed with patient: yes Best contact number of patient for referral team:   [phone] Has patient been seen by a specialist for this issue before: no Patient provider preference for referral:  Patient location preference for referral: [facility]?   Patient notified that referrals can take up to a week or longer to process. If they haven't heard anything within a week they should call back and speak with the referral department.   MMM's pt.  Pt is confused. Please call her ASAP!

## 2020-09-04 NOTE — Telephone Encounter (Signed)
Pt calling back for an update  

## 2020-09-09 NOTE — Telephone Encounter (Signed)
Please cancel mammogram on right- she had mastectmy in 2018

## 2020-09-11 NOTE — Telephone Encounter (Signed)
Spoke with [facility] and verified that they have correct orders needed

## 2020-09-17 DIAGNOSIS — N6331 Unspecified lump in axillary tail of the right breast: Secondary | ICD-10-CM | POA: Diagnosis not present

## 2020-09-17 DIAGNOSIS — N6489 Other specified disorders of breast: Secondary | ICD-10-CM | POA: Diagnosis not present

## 2020-09-17 DIAGNOSIS — R922 Inconclusive mammogram: Secondary | ICD-10-CM | POA: Diagnosis not present

## 2020-10-05 ENCOUNTER — Other Ambulatory Visit: Payer: Self-pay | Admitting: Nurse Practitioner

## 2020-10-05 DIAGNOSIS — I10 Essential (primary) hypertension: Secondary | ICD-10-CM

## 2020-10-21 DIAGNOSIS — M79676 Pain in unspecified toe(s): Secondary | ICD-10-CM | POA: Diagnosis not present

## 2020-10-21 DIAGNOSIS — L84 Corns and callosities: Secondary | ICD-10-CM | POA: Diagnosis not present

## 2020-10-21 DIAGNOSIS — B351 Tinea unguium: Secondary | ICD-10-CM | POA: Diagnosis not present

## 2020-10-21 DIAGNOSIS — I70203 Unspecified atherosclerosis of native arteries of extremities, bilateral legs: Secondary | ICD-10-CM | POA: Diagnosis not present

## 2020-11-24 ENCOUNTER — Other Ambulatory Visit: Payer: Self-pay | Admitting: Nurse Practitioner

## 2020-11-24 DIAGNOSIS — I10 Essential (primary) hypertension: Secondary | ICD-10-CM

## 2020-11-26 ENCOUNTER — Other Ambulatory Visit: Payer: Self-pay | Admitting: Nurse Practitioner

## 2020-11-26 DIAGNOSIS — I10 Essential (primary) hypertension: Secondary | ICD-10-CM

## 2020-11-28 ENCOUNTER — Other Ambulatory Visit: Payer: Self-pay | Admitting: Nurse Practitioner

## 2020-11-28 DIAGNOSIS — I10 Essential (primary) hypertension: Secondary | ICD-10-CM

## 2020-12-01 ENCOUNTER — Telehealth: Payer: Self-pay | Admitting: Nurse Practitioner

## 2020-12-01 ENCOUNTER — Other Ambulatory Visit: Payer: Self-pay | Admitting: *Deleted

## 2020-12-01 DIAGNOSIS — I10 Essential (primary) hypertension: Secondary | ICD-10-CM

## 2020-12-01 MED ORDER — CLONIDINE HCL 0.2 MG PO TABS: 0.2000 mg | ORAL_TABLET | Freq: Two times a day (BID) | ORAL | 1 refills | Status: DC

## 2020-12-01 NOTE — Progress Notes (Signed)
Updated clonidine RX sent to [facility]

## 2020-12-01 NOTE — Telephone Encounter (Signed)
  Prescription Request  12/01/2020  What is the name of the medication or equipment? cloNIDine (CATAPRES) 0.2 MG tablet   Have you contacted your pharmacy to request a refill? (if applicable) yes  Which pharmacy would you like this sent to? [facility]    Patient notified that their request is being sent to the clinical staff for review and that they should receive a response within 2 business days.

## 2020-12-01 NOTE — Telephone Encounter (Signed)
Sent to [facility] - corrected RX sent

## 2020-12-18 ENCOUNTER — Other Ambulatory Visit: Payer: Self-pay | Admitting: Nurse Practitioner

## 2020-12-18 DIAGNOSIS — F411 Generalized anxiety disorder: Secondary | ICD-10-CM

## 2020-12-18 DIAGNOSIS — E034 Atrophy of thyroid (acquired): Secondary | ICD-10-CM

## 2020-12-22 ENCOUNTER — Telehealth: Payer: Self-pay | Admitting: Nurse Practitioner

## 2020-12-22 DIAGNOSIS — E034 Atrophy of thyroid (acquired): Secondary | ICD-10-CM

## 2020-12-22 MED ORDER — LEVOTHYROXINE SODIUM 88 MCG PO TABS
88.0000 ug | ORAL_TABLET | Freq: Every day | ORAL | 2 refills | Status: DC
Start: 2020-12-22 — End: 2021-03-02

## 2020-12-22 NOTE — Telephone Encounter (Signed)
States that she has been on 88 mcg since before the last labs in April = lab was normal - 88 resent in

## 2020-12-25 ENCOUNTER — Ambulatory Visit (INDEPENDENT_AMBULATORY_CARE_PROVIDER_SITE_OTHER): Payer: Medicare PPO

## 2020-12-25 VITALS — Ht 61.0 in | Wt 174.0 lb

## 2020-12-25 DIAGNOSIS — Z0000 Encounter for general adult medical examination without abnormal findings: Secondary | ICD-10-CM

## 2020-12-25 NOTE — Progress Notes (Signed)
Subjective:   Darlene Gregory is a 87 y.o. female who presents for Medicare Annual (Subsequent) preventive examination.  Virtual Visit via Telephone Note  I connected with  Darlene Gregory on 12/25/20 at  8:15 AM EDT by telephone and verified that I am speaking with the correct person using two identifiers.  Location: Patient: Home Provider: [facility] Persons participating in the virtual visit: patient/Nurse Health Advisor   I discussed the limitations, risks, security and privacy concerns of performing an evaluation and management service by telephone and the availability of in person appointments. The patient expressed understanding and agreed to proceed.  Interactive audio and video telecommunications were attempted between this nurse and patient, however failed, due to patient having technical difficulties OR patient did not have access to video capability.  We continued and completed visit with audio only.  Some vital signs may be absent or patient reported.   Darlene Lard E Javone Ybanez, LPN   Review of Systems     Cardiac Risk Factors include: advanced age (>68mn, >>46women);sedentary lifestyle;obesity (BMI >30kg/m2)     Objective:    Today's Vitals   12/25/20 0823  Weight: 174 lb (78.9 kg)  Height: '5\' 1"'$  (1.549 m)   Body mass index is 32.88 kg/m.  Advanced Directives 12/25/2020 12/24/2019 12/13/2018 10/17/2017 11/12/2016 11/09/2016 11/05/2016  Does Patient Have a Medical Advance Directive? Yes No No Yes Yes Yes No  Type of AParamed[place]of [place]le[place] will - - [place] will [place] will - -  Does patient want to make changes to medical advance directive? - - - No - Patient declined No - Patient declined - -  Copy of [place] Chart? No - copy requested - - No - copy requested No - copy requested No - copy requested -  Would patient like information on creating a medical advance directive? - No -  Patient declined Yes (MAU/Ambulatory/Procedural Areas - Information given) - No - Patient declined - No - Patient declined    Current Medications (verified) Outpatient Encounter Medications as of 12/25/2020  Medication Sig   aspirin EC 81 MG tablet Take 1 tablet (81 mg total) by mouth daily.   busPIRone (BUSPAR) 5 MG tablet TAKE 1 TABLET BY MOUTH ONCE DAILY AS NEEDED   Cholecalciferol (VITAMIN D) 2000 UNITS CAPS Take by mouth daily.   cloNIDine (CATAPRES) 0.2 MG tablet Take 1 tablet (0.2 mg total) by mouth 2 (two) times daily.   Ferrous Sulfate (SLOW FE) 142 (45 Fe) MG TBCR Take 1 tablet by mouth daily.   FIBER PO Take 2 each by mouth daily.    fish oil-omega-3 fatty acids 1000 MG capsule Take 1 g by mouth daily.   furosemide (LASIX) 20 MG tablet Take 1 tablet (20 mg total) by mouth daily.   levothyroxine (EUTHYROX) 88 MCG tablet Take 1 tablet (88 mcg total) by mouth daily.   Multiple Vitamins-Minerals (PRESERVISION AREDS PO) Take by mouth.   olmesartan (BENICAR) 40 MG tablet Take 1 tablet (40 mg total) by mouth daily.   omeprazole (PRILOSEC) 20 MG capsule Take 1 capsule (20 mg total) by mouth daily.   Sod Fluoride-Potassium Nitrate (PREVIDENT 5000 SENSITIVE) 1.1-5 % GEL PreviDent 5000 Enamel Protect 1.1 %-5 % dental paste  BRUSH WITH TOOTHPASTE MORNING AND NIGHT   sodium fluoride (FLUORISHIELD) 1.1 % GEL dental gel SF 5000 Plus 1.1 % dental cream   tamoxifen (NOLVADEX) 20 MG tablet Take 1 tablet (20  mg total) by mouth daily.   vitamin C (ASCORBIC ACID) 500 MG tablet Take 500 mg by mouth daily.   No facility-administered encounter medications on file as of 12/25/2020.    Allergies (verified) Hyoscyamine sulfate, Norvasc [amlodipine besylate], Pyridium  [phenazopyridine hcl], Phenazopyridine hcl, and Sulfonamide derivatives   History: Past Medical History:  Diagnosis Date   Cancer ([place]) 10/2016   left breast cancer   Difficult intubation    "small trachea"; 01/18/05: glidescope but  unable to pass stylet -->fast track LMA with blind passage for ETT, consider awake intubation; 08/14/08: IV induction and DL with glidesecope for ETT   DJD (degenerative joint disease)    Frequent urination    GERD (gastroesophageal reflux disease)    Hypertension    Hypothyroidism    Osteoporosis    PONV (postoperative nausea and vomiting)    Thyroid disease    Past Surgical History:  Procedure Laterality Date   ABDOMINAL HYSTERECTOMY     BREAST LUMPECTOMY WITH RADIOACTIVE SEED LOCALIZATION Left 10/29/2016   Procedure: LEFT BREAST LUMPECTOMY WITH RADIOACTIVE SEED LOCALIZATION;  Surgeon: Excell Seltzer, MD;  Location: [place];  Service: General;  Laterality: Left;   CAROTID BODY TUMOR EXCISION     COLONOSCOPY W/ POLYPECTOMY     EYE SURGERY Bilateral    cataract removed   JOINT REPLACEMENT Right 2006   knee   MASTECTOMY Left 2018   PAROTIDECTOMY Left    RE-EXCISION OF BREAST LUMPECTOMY Left 11/05/2016   Procedure: RE-EXCISION OF LEFT BREAST LUMPECTOMY;  Surgeon: Excell Seltzer, MD;  Location: [facility];  Service: General;  Laterality: Left;   TOTAL KNEE ARTHROPLASTY Left 03/13/2014   Procedure: LEFT TOTAL KNEE ARTHROPLASTY;  Surgeon: Kerin Salen, MD;  Location: [facility];  Service: Orthopedics;  Laterality: Left;   TOTAL MASTECTOMY Left 11/12/2016   Procedure: LEFT TOTAL MASTECTOMY;  Surgeon: Excell Seltzer, MD;  Location: [facility];  Service: General;  Laterality: Left;   Family History  Problem Relation Age of Onset   Heart disease Mother    Emphysema Father    Cancer Sister        ovarian   Social History   Socioeconomic History   Marital status: Married    Spouse name: Darlene Gregory   Number of children: 2   Years of education: 12   Highest education level: 12th grade  Occupational History   Occupation: Optometrist    Comment: Retired  Tobacco Use   Smoking status: Former   Smokeless tobacco: Never  IT trainer Use: Never used  Substance and Sexual Activity   Alcohol use: No   Drug use: No   Sexual activity: Not Currently  Other Topics Concern   Not on file  Social History Narrative   2 sons, live in [place] another at the [facility]   Her husband is on home hospice care - 12/2020   Social Determinants of Health   Financial Resource Strain: Medium Risk   Difficulty of Paying Living Expenses: Somewhat hard  Food Insecurity: No Food Insecurity   Worried About Charity fundraiser in the Last Year: Never true   [place] in the Last Year: Never true  Transportation Needs: No Transportation Needs   Lack of Transportation (Medical): No   Lack of Transportation (Non-Medical): No  Physical Activity: Insufficiently Active   Days of Exercise per Week: 7 days   Minutes of Exercise per Session: 10 min  Stress:  No Stress Concern Present   Feeling of Stress : Only a little  Social Connections: Engineer, building services of Communication with Friends and Family: More than three times a week   Frequency of Social Gatherings with Friends and Family: More than three times a week   Attends Religious Services: More than 4 times per year   Active Member of [employer] or Organizations: Yes   Attends Music therapist: More than 4 times per year   Marital Status: Married    Tobacco Counseling Counseling given: Not Answered   Clinical Intake:  Pre-visit preparation completed: Yes  Pain : No/denies pain     BMI - recorded: 32.88 Nutritional Status: BMI > 30  Obese Nutritional Risks: None Diabetes: No  How often do you need to have someone help you when you read instructions, pamphlets, or other written materials from your doctor or pharmacy?: 5 - Always (cannot see well enough)  Diabetic? No  Interpreter Needed?: No  Information entered by :: Latonya Knight, LPN   Activities of Daily Living In your present state of health, do you have any difficulty performing  the following activities: 12/25/2020  Hearing? N  Vision? Y  Difficulty concentrating or making decisions? Y  Walking or climbing stairs? Y  Dressing or bathing? N  Doing errands, shopping? Y  Comment son drives - she cannot see well  Preparing Food and eating ? N  Using the Toilet? N  In the past six months, have you accidently leaked urine? Y  Comment rarely  Do you have problems with loss of bowel control? N  Managing your Medications? N  Managing your Finances? N  Housekeeping or managing your Housekeeping? N  Some recent data might be hidden    Patient Care Team: Chevis Pretty, FNP as PCP - General (Nurse Practitioner) Excell Seltzer, MD (Inactive) as Consulting Physician (General Surgery) Magrinat, Virgie Dad, MD as Consulting Physician (Oncology) Eppie Gibson, MD as Attending Physician (Radiation Oncology) Arvella Nigh, MD as Consulting Physician (Obstetrics and Gynecology) Steffanie Rainwater, DPM as Consulting Physician (Podiatry)  Indicate any recent Medical Services you may have received from other than [facility] providers in the past year (date may be approximate).     Assessment:   This is a routine wellness examination for Jyla.  Hearing/Vision screen Hearing Screening - Comments:: Denies hearing difficulties  Vision Screening - Comments:: Wears eyeglasses - was told they can't do anything to help more with her vision, so she doesn't see eye doctor anymore  Dietary issues and exercise activities discussed: Current Exercise Habits: Home exercise routine, Type of exercise: walking, Time (Minutes): 10, Frequency (Times/Week): 7, Weekly Exercise (Minutes/Week): 70, Intensity: Mild, Exercise limited by: Other - see comments (poor vision)   Goals Addressed             This Visit's Progress    DIET - INCREASE WATER INTAKE   Not on track    Drink 1.5 to 2 quarts of water a day      Exercise 150 min/wk Moderate Activity   Not on track    Prevent falls   On track       Depression Screen PHQ 2/9 Scores 12/25/2020 08/27/2020 02/26/2020 12/24/2019 08/23/2019 02/20/2019 12/13/2018  PHQ - 2 Score 0 0 0 0 0 0 0  PHQ- 9 Score - - - - - - 0    Fall Risk Fall Risk  12/25/2020 08/27/2020 02/26/2020 12/24/2019 08/23/2019  Falls in the past year? 0 0 1  0 0  Number falls in past yr: 0 - 0 - -  Injury with Fall? 0 - 0 - -  Comment - - - - -  Risk for fall due to : Impaired balance/gait;Impaired vision - History of fall(s) - -  Risk for fall due to: Comment She doesn't use a cane, but she holds on to whoever is assisting her when she goes out - cannot see well and has poor balance - - - -  Follow up Falls prevention discussed;Education provided - Education provided - -    FALL RISK PREVENTION PERTAINING TO THE HOME:  Any stairs in or around the home? Yes  If so, are there any without handrails? No  Home free of loose throw rugs in walkways, pet beds, electrical cords, etc? Yes  Adequate lighting in your home to reduce risk of falls? Yes   ASSISTIVE DEVICES UTILIZED TO PREVENT FALLS:  Life alert? No  Use of a cane, walker or w/c? No  Grab bars in the bathroom? Yes  Shower chair or bench in shower? No  Elevated toilet seat or a handicapped toilet? No   TIMED UP AND GO:  Was the test performed? No . Telephonic visit.  Cognitive Function: MMSE - Mini Mental State Exam 10/17/2017  Orientation to time 4  Orientation to Place 5  Registration 3  Attention/ Calculation 4  Recall 2  Language- name 2 objects 2  Language- repeat 1  Language- follow 3 step command 3  Language- read & follow direction 1  Write a sentence 1  Copy design 1  Total score 27     6CIT Screen 12/25/2020 12/24/2019 12/13/2018  What Year? 0 points 0 points 0 points  What month? 0 points 0 points 0 points  What time? 0 points 0 points 0 points  Count back from 20 0 points 0 points 0 points  Months in reverse 0 points 2 points 0 points  Repeat phrase 2 points 4 points 0 points  Total  Score 2 6 0    Immunizations Immunization History  Administered Date(s) Administered   Moderna Sars-Covid-2 Vaccination 06/02/2019, 07/05/2019   Pneumococcal Conjugate-13 02/13/2014   Pneumococcal Polysaccharide-23 03/31/2010   Tdap 11/05/2013   Zoster Recombinat (Shingrix) 09/28/2016    TDAP status: Up to date  Flu Vaccine status: Declined, Education has been provided regarding the importance of this vaccine but patient still declined. Advised may receive this vaccine at local pharmacy or Health Dept. Aware to provide a copy of the vaccination record if obtained from local pharmacy or Health Dept. Verbalized acceptance and understanding.  Pneumococcal vaccine status: Up to date  Covid-19 vaccine status: Completed vaccines  Qualifies for Shingles Vaccine? Yes   Zostavax completed Yes   Shingrix Completed? No - got first vaccine and had allergic reaction - was advised not to get second dose  Screening Tests Health Maintenance  Topic Date Due   Zoster Vaccines- Shingrix (2 of 2) 11/23/2016   COVID-19 Vaccine (3 - Moderna risk series) 08/02/2019   TETANUS/TDAP  11/06/2023   DEXA SCAN  Completed   PNA vac Low Risk Adult  Completed   HPV VACCINES  Aged Out   MAMMOGRAM  Discontinued   COLONOSCOPY (Pts 45-46yr Insurance coverage will need to be confirmed)  Discontinued    Health Maintenance  Health Maintenance Due  Topic Date Due   Zoster Vaccines- Shingrix (2 of 2) 11/23/2016   COVID-19 Vaccine (3 - Moderna risk series) 08/02/2019    Colorectal  cancer screening: No longer required.   Mammogram status: Completed 09/17/2020. Repeat every year  Bone Density status: Completed 04/20/2013. Results reflect: Bone density results: OSTEOPENIA. Repeat every 2 years. She may consider repeat at next visit  Lung Cancer Screening: (Low Dose CT Chest recommended if Age 10-80 years, 30 pack-year currently smoking OR have quit w/in 15years.) does not qualify.   Additional  Screening:  Hepatitis C Screening: does not qualify  Vision Screening: Recommended annual ophthalmology exams for early detection of glaucoma and other disorders of the eye. Is the patient up to date with their annual eye exam?  No  Who is the provider or what is the name of the office in which the patient attends annual eye exams? Someone in [place] If pt is not established with a provider, would they like to be referred to a provider to establish care? No .   Dental Screening: Recommended annual dental exams for proper oral hygiene  Community Resource Referral / Chronic Care Management: CRR required this visit?  No   CCM required this visit?  No      Plan:     I have personally reviewed and noted the following in the patient's chart:   Medical and social history Use of alcohol, tobacco or illicit drugs  Current medications and supplements including opioid prescriptions.  Functional ability and status Nutritional status Physical activity Advanced directives List of other physicians Hospitalizations, surgeries, and ER visits in previous 12 months Vitals Screenings to include cognitive, depression, and falls Referrals and appointments  In addition, I have reviewed and discussed with patient certain preventive protocols, quality metrics, and best practice recommendations. A written personalized care plan for preventive services as well as general preventive health recommendations were provided to patient.     Sandrea Hammond, LPN   [id number]   Nurse Notes: None

## 2020-12-25 NOTE — Patient Instructions (Signed)
Ms. Darlene Gregory , Thank you for taking time to come for your Medicare Wellness Visit. I appreciate your ongoing commitment to your health goals. Please review the following plan we discussed and let me know if I can assist you in the future.   Screening recommendations/referrals: Colonoscopy: Done 04/02/2013 - Repeat not required Mammogram: Done 09/17/2020 - Repeat annually Bone Density: Done 04/20/2013 - Recommend Repeat every 2 years *consider repeat at next office visit Recommended yearly ophthalmology/optometry visit for glaucoma screening and checkup Recommended yearly dental visit for hygiene and checkup  Vaccinations: Influenza vaccine: Declined Pneumococcal vaccine: Done 02/13/2014 & 03/31/2010 Tdap vaccine: Done 11/05/2013 - Repeat in 10 years  Shingles vaccine: Allergic - first dose 09/28/2016 - do not recommend second dose   Covid-19: Done 06/02/19 & 07/05/2019 - due for booster  Advanced directives: Please bring a copy of your health care power of attorney and living will to the office to be added to your chart at your convenience.   Conditions/risks identified: Aim for 30 minutes of exercise each day in safe areas - continue fall prevention, drink 6-8 glasses of water and eat lots of fruits and vegetables.   Next appointment: Follow up in one year for your annual wellness visit    Preventive Care 65 Years and Older, Female Preventive care refers to lifestyle choices and visits with your health care provider that can promote health and wellness. What does preventive care include? A yearly physical exam. This is also called an annual well check. Dental exams once or twice a year. Routine eye exams. Ask your health care provider how often you should have your eyes checked. Personal lifestyle choices, including: Daily care of your teeth and gums. Regular physical activity. Eating a healthy diet. Avoiding tobacco and drug use. Limiting alcohol use. Practicing safe sex. Taking  low-dose aspirin every day. Taking vitamin and mineral supplements as recommended by your health care provider. What happens during an annual well check? The services and screenings done by your health care provider during your annual well check will depend on your age, overall health, lifestyle risk factors, and family history of disease. Counseling  Your health care provider may ask you questions about your: Alcohol use. Tobacco use. Drug use. Emotional well-being. Home and relationship well-being. Sexual activity. Eating habits. History of falls. Memory and ability to understand (cognition). Work and work Statistician. Reproductive health. Screening  You may have the following tests or measurements: Height, weight, and BMI. Blood pressure. Lipid and cholesterol levels. These may be checked every 5 years, or more frequently if you are over 11 years old. Skin check. Lung cancer screening. You may have this screening every year starting at age 60 if you have a 30-pack-year history of smoking and currently smoke or have quit within the past 15 years. Fecal occult blood test (FOBT) of the stool. You may have this test every year starting at age 70. Flexible sigmoidoscopy or colonoscopy. You may have a sigmoidoscopy every 5 years or a colonoscopy every 10 years starting at age 47. Hepatitis C blood test. Hepatitis B blood test. Sexually transmitted disease (STD) testing. Diabetes screening. This is done by checking your blood sugar (glucose) after you have not eaten for a while (fasting). You may have this done every 1-3 years. Bone density scan. This is done to screen for osteoporosis. You may have this done starting at age 87. Mammogram. This may be done every 1-2 years. Talk to your health care provider about how often you should  have regular mammograms. Talk with your health care provider about your test results, treatment options, and if necessary, the need for more tests. Vaccines   Your health care provider may recommend certain vaccines, such as: Influenza vaccine. This is recommended every year. Tetanus, diphtheria, and acellular pertussis (Tdap, Td) vaccine. You may need a Td booster every 10 years. Zoster vaccine. You may need this after age 8. Pneumococcal 13-valent conjugate (PCV13) vaccine. One dose is recommended after age 43. Pneumococcal polysaccharide (PPSV23) vaccine. One dose is recommended after age 28. Talk to your health care provider about which screenings and vaccines you need and how often you need them. This information is not intended to replace advice given to you by your health care provider. Make sure you discuss any questions you have with your health care provider. Document Released: 05/23/2015 Document Revised: 01/14/2016 Document Reviewed: 02/25/2015 Elsevier Interactive Patient Education  2017 [employer] Prevention in the Home Falls can cause injuries. They can happen to people of all ages. There are many things you can do to make your home safe and to help prevent falls. What can I do on the outside of my home? Regularly fix the edges of walkways and driveways and fix any cracks. Remove anything that might make you trip as you walk through a door, such as a raised step or threshold. Trim any bushes or trees on the path to your home. Use bright outdoor lighting. Clear any walking paths of anything that might make someone trip, such as rocks or tools. Regularly check to see if handrails are loose or broken. Make sure that both sides of any steps have handrails. Any raised decks and porches should have guardrails on the edges. Have any leaves, snow, or ice cleared regularly. Use sand or salt on walking paths during winter. Clean up any spills in your garage right away. This includes oil or grease spills. What can I do in the bathroom? Use night lights. Install grab bars by the toilet and in the tub and shower. Do not use towel  bars as grab bars. Use non-skid mats or decals in the tub or shower. If you need to sit down in the shower, use a plastic, non-slip stool. Keep the floor dry. Clean up any water that spills on the floor as soon as it happens. Remove soap buildup in the tub or shower regularly. Attach bath mats securely with double-sided non-slip rug tape. Do not have throw rugs and other things on the floor that can make you trip. What can I do in the bedroom? Use night lights. Make sure that you have a light by your bed that is easy to reach. Do not use any sheets or blankets that are too big for your bed. They should not hang down onto the floor. Have a firm chair that has side arms. You can use this for support while you get dressed. Do not have throw rugs and other things on the floor that can make you trip. What can I do in the kitchen? Clean up any spills right away. Avoid walking on wet floors. Keep items that you use a lot in easy-to-reach places. If you need to reach something above you, use a strong step stool that has a grab bar. Keep electrical cords out of the way. Do not use floor polish or wax that makes floors slippery. If you must use wax, use non-skid floor wax. Do not have throw rugs and other things on the floor  that can make you trip. What can I do with my stairs? Do not leave any items on the stairs. Make sure that there are handrails on both sides of the stairs and use them. Fix handrails that are broken or loose. Make sure that handrails are as long as the stairways. Check any carpeting to make sure that it is firmly attached to the stairs. Fix any carpet that is loose or worn. Avoid having throw rugs at the top or bottom of the stairs. If you do have throw rugs, attach them to the floor with carpet tape. Make sure that you have a light switch at the top of the stairs and the bottom of the stairs. If you do not have them, ask someone to add them for you. What else can I do to help  prevent falls? Wear shoes that: Do not have high heels. Have rubber bottoms. Are comfortable and fit you well. Are closed at the toe. Do not wear sandals. If you use a stepladder: Make sure that it is fully opened. Do not climb a closed stepladder. Make sure that both sides of the stepladder are locked into place. Ask someone to hold it for you, if possible. Clearly mark and make sure that you can see: Any grab bars or handrails. First and last steps. Where the edge of each step is. Use tools that help you move around (mobility aids) if they are needed. These include: Canes. Walkers. Scooters. Crutches. Turn on the lights when you go into a dark area. Replace any light bulbs as soon as they burn out. Set up your furniture so you have a clear path. Avoid moving your furniture around. If any of your floors are uneven, fix them. If there are any pets around you, be aware of where they are. Review your medicines with your doctor. Some medicines can make you feel dizzy. This can increase your chance of falling. Ask your doctor what other things that you can do to help prevent falls. This information is not intended to replace advice given to you by your health care provider. Make sure you discuss any questions you have with your health care provider. Document Released: 02/20/2009 Document Revised: 10/02/2015 Document Reviewed: 05/31/2014 Elsevier Interactive Patient Education  2017 [employer].

## 2021-01-06 DIAGNOSIS — L84 Corns and callosities: Secondary | ICD-10-CM | POA: Diagnosis not present

## 2021-01-06 DIAGNOSIS — B351 Tinea unguium: Secondary | ICD-10-CM | POA: Diagnosis not present

## 2021-01-06 DIAGNOSIS — I70203 Unspecified atherosclerosis of native arteries of extremities, bilateral legs: Secondary | ICD-10-CM | POA: Diagnosis not present

## 2021-01-06 DIAGNOSIS — M79676 Pain in unspecified toe(s): Secondary | ICD-10-CM | POA: Diagnosis not present

## 2021-02-05 ENCOUNTER — Ambulatory Visit: Payer: Medicare PPO | Admitting: Oncology

## 2021-02-12 DIAGNOSIS — M25571 Pain in right ankle and joints of right foot: Secondary | ICD-10-CM | POA: Diagnosis not present

## 2021-02-12 DIAGNOSIS — M79671 Pain in right foot: Secondary | ICD-10-CM | POA: Diagnosis not present

## 2021-02-16 ENCOUNTER — Telehealth: Payer: Self-pay | Admitting: Nurse Practitioner

## 2021-02-16 NOTE — Telephone Encounter (Signed)
Darlene Gregory would like for you to refer her for therapy in her home a couple days per week to help her get stronger and more mobile. Her husband passed last week. She states that she has been in the home and less active since she has been there in the home caring for him over these last months. She feels that she has become debilitated and needs some therapy to help her.

## 2021-02-19 NOTE — Telephone Encounter (Signed)
In order to do home health referral need to have a face to face visit next week. Please schedule her . Tell her I am very sorry about her husband.

## 2021-02-19 NOTE — Telephone Encounter (Signed)
Patient aware and verbalizes understanding.  States she will call back and schedule an appointment.

## 2021-02-20 ENCOUNTER — Telehealth: Payer: Self-pay | Admitting: Oncology

## 2021-02-20 NOTE — Telephone Encounter (Signed)
Scheduled per 10/14 in basket, pt has been called and confirmed appt 

## 2021-02-24 DIAGNOSIS — Z6832 Body mass index (BMI) 32.0-32.9, adult: Secondary | ICD-10-CM | POA: Diagnosis not present

## 2021-02-24 DIAGNOSIS — Z124 Encounter for screening for malignant neoplasm of cervix: Secondary | ICD-10-CM | POA: Diagnosis not present

## 2021-02-27 ENCOUNTER — Ambulatory Visit: Payer: Self-pay | Admitting: Nurse Practitioner

## 2021-03-02 ENCOUNTER — Ambulatory Visit (INDEPENDENT_AMBULATORY_CARE_PROVIDER_SITE_OTHER): Payer: Medicare PPO

## 2021-03-02 ENCOUNTER — Encounter: Payer: Self-pay | Admitting: Nurse Practitioner

## 2021-03-02 ENCOUNTER — Ambulatory Visit: Payer: Medicare PPO | Admitting: Nurse Practitioner

## 2021-03-02 ENCOUNTER — Other Ambulatory Visit: Payer: Self-pay

## 2021-03-02 VITALS — BP 136/80 | HR 57 | Temp 98.0°F | Resp 20 | Ht 61.0 in | Wt 172.0 lb

## 2021-03-02 DIAGNOSIS — E034 Atrophy of thyroid (acquired): Secondary | ICD-10-CM | POA: Diagnosis not present

## 2021-03-02 DIAGNOSIS — M79604 Pain in right leg: Secondary | ICD-10-CM

## 2021-03-02 DIAGNOSIS — Z6832 Body mass index (BMI) 32.0-32.9, adult: Secondary | ICD-10-CM | POA: Diagnosis not present

## 2021-03-02 DIAGNOSIS — N1832 Chronic kidney disease, stage 3b: Secondary | ICD-10-CM | POA: Diagnosis not present

## 2021-03-02 DIAGNOSIS — F411 Generalized anxiety disorder: Secondary | ICD-10-CM | POA: Diagnosis not present

## 2021-03-02 DIAGNOSIS — I10 Essential (primary) hypertension: Secondary | ICD-10-CM

## 2021-03-02 DIAGNOSIS — M1009 Idiopathic gout, multiple sites: Secondary | ICD-10-CM

## 2021-03-02 DIAGNOSIS — D509 Iron deficiency anemia, unspecified: Secondary | ICD-10-CM | POA: Diagnosis not present

## 2021-03-02 DIAGNOSIS — M19011 Primary osteoarthritis, right shoulder: Secondary | ICD-10-CM | POA: Diagnosis not present

## 2021-03-02 DIAGNOSIS — M25511 Pain in right shoulder: Secondary | ICD-10-CM

## 2021-03-02 DIAGNOSIS — M79605 Pain in left leg: Secondary | ICD-10-CM

## 2021-03-02 DIAGNOSIS — R609 Edema, unspecified: Secondary | ICD-10-CM

## 2021-03-02 DIAGNOSIS — K219 Gastro-esophageal reflux disease without esophagitis: Secondary | ICD-10-CM

## 2021-03-02 DIAGNOSIS — K5730 Diverticulosis of large intestine without perforation or abscess without bleeding: Secondary | ICD-10-CM

## 2021-03-02 DIAGNOSIS — R600 Localized edema: Secondary | ICD-10-CM

## 2021-03-02 MED ORDER — OMEPRAZOLE 20 MG PO CPDR: 20.0000 mg | DELAYED_RELEASE_CAPSULE | Freq: Every day | ORAL | 1 refills | Status: DC

## 2021-03-02 MED ORDER — FUROSEMIDE 20 MG PO TABS: 20.0000 mg | ORAL_TABLET | Freq: Every day | ORAL | 1 refills | Status: DC

## 2021-03-02 MED ORDER — BUSPIRONE HCL 5 MG PO TABS: 5.0000 mg | ORAL_TABLET | Freq: Every day | ORAL | 5 refills | Status: DC | PRN

## 2021-03-02 MED ORDER — PREDNISONE 20 MG PO TABS
40.0000 mg | ORAL_TABLET | Freq: Every day | ORAL | 0 refills | Status: AC
Start: 2021-03-02 — End: 2021-03-07

## 2021-03-02 MED ORDER — ALPRAZOLAM 0.25 MG PO TABS: 0.2500 mg | ORAL_TABLET | Freq: Two times a day (BID) | ORAL | 5 refills | Status: DC | PRN

## 2021-03-02 MED ORDER — GABAPENTIN 100 MG PO CAPS: 100.0000 mg | ORAL_CAPSULE | Freq: Three times a day (TID) | ORAL | 3 refills | Status: DC

## 2021-03-02 MED ORDER — OLMESARTAN MEDOXOMIL 40 MG PO TABS: 40.0000 mg | ORAL_TABLET | Freq: Every day | ORAL | 1 refills | Status: DC

## 2021-03-02 MED ORDER — LEVOTHYROXINE SODIUM 88 MCG PO TABS: 88.0000 ug | ORAL_TABLET | Freq: Every day | ORAL | 2 refills | Status: DC

## 2021-03-02 MED ORDER — SLOW FE 142 (45 FE) MG PO TBCR: 1.0000 | EXTENDED_RELEASE_TABLET | Freq: Every day | ORAL | 1 refills | Status: DC

## 2021-03-02 NOTE — Progress Notes (Addendum)
Subjective:    Patient ID: Darlene Gregory, female    DOB: 1933-12-25, 87 y.o.   MRN: [id number]   Chief Complaint: medical management of chronic issues    HPI:  1. Essential hypertension, benign No c/o chest pain, sob or headache. Does not check blood pressure at home. BP Readings from Last 3 Encounters:  03/02/21 (!) 149/72  08/27/20 (!) 166/78  02/26/20 (!) 197/84     2. Gastroesophageal reflux disease without esophagitis Is on omeprezole at home.works well to keep symptoms under control.  3. Diverticulosis of colon No recent flare ups  4. Hypothyroidism due to acquired atrophy of thyroid No problems that aware. Lab Results  Component Value Date   TSH 1.440 08/27/2020     5. Stage 3b chronic kidney disease ([place]) No problems voiding. Lab Results  Component Value Date   CREATININE 1.55 (H) 08/27/2020     6. Acute idiopathic gout of multiple sites No recent flare ups  7. GAD (generalized anxiety disorder) Is on buspar as needed. Usually takes at night  8. Iron deficiency anemia, unspecified iron deficiency anemia type No c/o fatigue. Lab Results  Component Value Date   HGB 11.9 08/27/2020    9. Peripheral edema Lasix working well.  10. BMI 32.0-32.9,adult No recent weight changes Wt Readings from Last 3 Encounters:  03/02/21 172 lb (78 kg)  12/25/20 174 lb (78.9 kg)  08/27/20 171 lb (77.6 kg)   BMI Readings from Last 3 Encounters:  03/02/21 32.50 kg/m  12/25/20 32.88 kg/m  08/27/20 32.31 kg/m      Outpatient Encounter Medications as of 03/02/2021  Medication Sig   aspirin EC 81 MG tablet Take 1 tablet (81 mg total) by mouth daily.   busPIRone (BUSPAR) 5 MG tablet TAKE 1 TABLET BY MOUTH ONCE DAILY AS NEEDED   Cholecalciferol (VITAMIN D) 2000 UNITS CAPS Take by mouth daily.   cloNIDine (CATAPRES) 0.2 MG tablet Take 1 tablet (0.2 mg total) by mouth 2 (two) times daily.   Ferrous Sulfate (SLOW FE) 142 (45 Fe) MG TBCR Take 1 tablet by mouth  daily.   FIBER PO Take 2 each by mouth daily.    fish oil-omega-3 fatty acids 1000 MG capsule Take 1 g by mouth daily.   furosemide (LASIX) 20 MG tablet Take 1 tablet (20 mg total) by mouth daily.   levothyroxine (EUTHYROX) 88 MCG tablet Take 1 tablet (88 mcg total) by mouth daily.   Multiple Vitamins-Minerals (PRESERVISION AREDS PO) Take by mouth.   olmesartan (BENICAR) 40 MG tablet Take 1 tablet (40 mg total) by mouth daily.   omeprazole (PRILOSEC) 20 MG capsule Take 1 capsule (20 mg total) by mouth daily.   Sod Fluoride-Potassium Nitrate (PREVIDENT 5000 SENSITIVE) 1.1-5 % GEL PreviDent 5000 Enamel Protect 1.1 %-5 % dental paste  BRUSH WITH TOOTHPASTE MORNING AND NIGHT   sodium fluoride (FLUORISHIELD) 1.1 % GEL dental gel SF 5000 Plus 1.1 % dental cream   tamoxifen (NOLVADEX) 20 MG tablet Take 1 tablet (20 mg total) by mouth daily.   vitamin C (ASCORBIC ACID) 500 MG tablet Take 500 mg by mouth daily.   No facility-administered encounter medications on file as of 03/02/2021.    Past Surgical History:  Procedure Laterality Date   ABDOMINAL HYSTERECTOMY     BREAST LUMPECTOMY WITH RADIOACTIVE SEED LOCALIZATION Left 10/29/2016   Procedure: LEFT BREAST LUMPECTOMY WITH RADIOACTIVE SEED LOCALIZATION;  Surgeon: Excell Seltzer, MD;  Location: [facility];  Service: General;  Laterality:  Left;   CAROTID BODY TUMOR EXCISION     COLONOSCOPY W/ POLYPECTOMY     EYE SURGERY Bilateral    cataract removed   JOINT REPLACEMENT Right 2006   knee   MASTECTOMY Left 2018   PAROTIDECTOMY Left    RE-EXCISION OF BREAST LUMPECTOMY Left 11/05/2016   Procedure: RE-EXCISION OF LEFT BREAST LUMPECTOMY;  Surgeon: Excell Seltzer, MD;  Location: [facility];  Service: General;  Laterality: Left;   TOTAL KNEE ARTHROPLASTY Left 03/13/2014   Procedure: LEFT TOTAL KNEE ARTHROPLASTY;  Surgeon: Kerin Salen, MD;  Location: [facility];  Service: Orthopedics;  Laterality: Left;   TOTAL  MASTECTOMY Left 11/12/2016   Procedure: LEFT TOTAL MASTECTOMY;  Surgeon: Excell Seltzer, MD;  Location: [facility];  Service: General;  Laterality: Left;    Family History  Problem Relation Age of Onset   Heart disease Mother    Emphysema Father    Cancer Sister        ovarian    New complaints: Her legs have been bothering her. She is very weak. Would like to have PT at home to help with her strength and balance. Has constant leg pain has been taking her husbands gabapentin and that has really helped her. Having right shoulder pain and would like xray. Shoulder has been hurting for years. Cannot lay on her right side. Pain with movement.  Social history: Husband recently passed away. Lives by herself.  Controlled substance contract: n/a     Review of Systems  Constitutional:  Negative for diaphoresis.  Eyes:  Negative for pain.  Respiratory:  Negative for shortness of breath.   Cardiovascular:  Negative for chest pain, palpitations and leg swelling.  Gastrointestinal:  Negative for abdominal pain.  Endocrine: Negative for polydipsia.  Skin:  Negative for rash.  Neurological:  Negative for dizziness, weakness and headaches.  Hematological:  Does not bruise/bleed easily.  All other systems reviewed and are negative.     Objective:   Physical Exam Vitals and nursing note reviewed.  Constitutional:      General: She is not in acute distress.    Appearance: Normal appearance. She is well-developed.  HENT:     Head: Normocephalic.     Right Ear: Tympanic membrane normal.     Left Ear: Tympanic membrane normal.     Nose: Nose normal.     Mouth/Throat:     Mouth: Mucous membranes are moist.  Eyes:     Pupils: Pupils are equal, round, and reactive to light.  Neck:     Vascular: No carotid bruit or JVD.  Cardiovascular:     Rate and Rhythm: Normal rate and regular rhythm.     Heart sounds: Murmur (2/6 systolic) heard.  Pulmonary:     Effort: Pulmonary  effort is normal. No respiratory distress.     Breath sounds: Normal breath sounds. No wheezing or rales.  Chest:     Chest wall: No tenderness.  Abdominal:     General: Bowel sounds are normal. There is no distension or abdominal bruit.     Palpations: Abdomen is soft. There is no hepatomegaly, splenomegaly, mass or pulsatile mass.     Tenderness: There is no abdominal tenderness.  Musculoskeletal:        General: Normal range of motion.     Cervical back: Normal range of motion and neck supple.     Right lower leg: Edema (1+) present.     Left lower leg: Edema (1+)  present.     Comments: Gait slow and unsteady with cane for stabilization FROM of right shoulder without pain Grips equal bil.  Lymphadenopathy:     Cervical: No cervical adenopathy.  Skin:    General: Skin is warm and dry.  Neurological:     Mental Status: She is alert and oriented to person, place, and time.     Deep Tendon Reflexes: Reflexes are normal and symmetric.  Psychiatric:        Behavior: Behavior normal.        Thought Content: Thought content normal.        Judgment: Judgment normal.    BP 136/80   Pulse (!) 57   Temp 98 F (36.7 C) (Temporal)   Resp 20   Ht 5\' 1"  (1.549 m)   Wt 172 lb (78 kg)   SpO2 99%   BMI 32.50 kg/m   Shoulder xray normal-Preliminary reading by Ronnald Collum, FNP  [facility]       Assessment & Plan:  Darlene Gregory comes in today with chief complaint of Medical Management of Chronic Issues   Diagnosis and orders addressed:  1. Essential hypertension, benign Low sodium diet - olmesartan (BENICAR) 40 MG tablet; Take 1 tablet (40 mg total) by mouth daily.  Dispense: 90 tablet; Refill: 1  2. Gastroesophageal reflux disease without esophagitis Avoid spicy foods Do not eat 2 hours prior to bedtime - omeprazole (PRILOSEC) 20 MG capsule; Take 1 capsule (20 mg total) by mouth daily.  Dispense: 90 capsule; Refill: 1  3. Diverticulosis of colon Watch diet to prevent flare  up  4. Hypothyroidism due to acquired atrophy of thyroid Labs pending - levothyroxine (EUTHYROX) 88 MCG tablet; Take 1 tablet (88 mcg total) by mouth daily.  Dispense: 90 tablet; Refill: 2  5. Stage 3b chronic kidney disease ([place]) Labs pending  6. Acute idiopathic gout of multiple sites Report any flare up  7. GAD (generalized anxiety disorder) Stress management - ALPRAZolam (XANAX) 0.25 MG tablet; Take 1 tablet (0.25 mg total) by mouth 2 (two) times daily as needed for anxiety.  Dispense: 30 tablet; Refill: 5 - busPIRone (BUSPAR) 5 MG tablet; Take 1 tablet (5 mg total) by mouth daily as needed.  Dispense: 30 tablet; Refill: 5  8. Iron deficiency anemia, unspecified iron deficiency anemia type Labs pending - Ferrous Sulfate (SLOW FE) 142 (45 Fe) MG TBCR; Take 1 tablet by mouth daily.  Dispense: 90 tablet; Refill: 1  9. BMI 32.0-32.9,adult Discussed diet and exercise for person with BMI >25 Will recheck weight in 3-6 months   10. Acute pain of right shoulder If steroids do not help will do ortho refrral - DG Shoulder Right - predniSONE (DELTASONE) 20 MG tablet; Take 2 tablets (40 mg total) by mouth daily with breakfast for 5 days. 2 po daily for 5 days  Dispense: 10 tablet; Refill: 0  11. Pain in both lower extremities Added gabapentin to meds - Ambulatory referral to [facility] - gabapentin (NEURONTIN) 100 MG capsule; Take 1 capsule (100 mg total) by mouth 3 (three) times daily.  Dispense: 90 capsule; Refill: 3  12. Peripheral edema Elevate legs when sitting - furosemide (LASIX) 20 MG tablet; Take 1 tablet (20 mg total) by mouth daily.  Dispense: 90 tablet; Refill: 1   Labs pending Health Maintenance reviewed Diet and exercise encouraged  Follow up plan: 6 months   [name], FNP

## 2021-03-02 NOTE — Patient Instructions (Signed)

## 2021-03-02 NOTE — Addendum Note (Signed)
Addended by: Chevis Pretty on: 03/02/2021 09:20 AM   Modules accepted: Orders

## 2021-03-03 LAB — CMP14+EGFR
ALT: 9 IU/L (ref 0–32)
AST: 11 IU/L (ref 0–40)
Albumin/Globulin Ratio: 1.7 (ref 1.2–2.2)
Albumin: 3.8 g/dL (ref 3.6–4.6)
Alkaline Phosphatase: 80 IU/L (ref 44–121)
BUN/Creatinine Ratio: 21 (ref 12–28)
BUN: 36 mg/dL — ABNORMAL HIGH (ref 8–27)
Bilirubin Total: 0.4 mg/dL (ref 0.0–1.2)
CO2: 26 mmol/L (ref 20–29)
Calcium: 9.3 mg/dL (ref 8.7–10.3)
Chloride: 103 mmol/L (ref 96–106)
Creatinine, Ser: 1.68 mg/dL — ABNORMAL HIGH (ref 0.57–1.00)
Globulin, Total: 2.2 g/dL (ref 1.5–4.5)
Glucose: 108 mg/dL — ABNORMAL HIGH (ref 70–99)
Potassium: 4.2 mmol/L (ref 3.5–5.2)
Sodium: 141 mmol/L (ref 134–144)
Total Protein: 6 g/dL (ref 6.0–8.5)
eGFR: 30 mL/min/{1.73_m2} — ABNORMAL LOW (ref >59)

## 2021-03-03 LAB — CBC WITH DIFFERENTIAL/PLATELET
Basophils Absolute: 0.1 10*3/uL (ref 0.0–0.2)
Basos: 1 %
EOS (ABSOLUTE): 0.2 10*3/uL (ref 0.0–0.4)
Eos: 4 %
Hematocrit: 34 % (ref 34.0–46.6)
Hemoglobin: 11.2 g/dL (ref 11.1–15.9)
Immature Grans (Abs): 0 10*3/uL (ref 0.0–0.1)
Immature Granulocytes: 0 %
Lymphocytes Absolute: 1.5 10*3/uL (ref 0.7–3.1)
Lymphs: 23 %
MCH: 31.6 pg (ref 26.6–33.0)
MCHC: 32.9 g/dL (ref 31.5–35.7)
MCV: 96 fL (ref 79–97)
Monocytes Absolute: 0.5 10*3/uL (ref 0.1–0.9)
Monocytes: 8 %
Neutrophils Absolute: 4.2 10*3/uL (ref 1.4–7.0)
Neutrophils: 64 %
Platelets: 123 10*3/uL — ABNORMAL LOW (ref 150–450)
RBC: 3.54 x10E6/uL — ABNORMAL LOW (ref 3.77–5.28)
RDW: 12 % (ref 11.7–15.4)
WBC: 6.6 10*3/uL (ref 3.4–10.8)

## 2021-03-03 LAB — LIPID PANEL
Chol/HDL Ratio: 2.1 ratio (ref 0.0–4.4)
Cholesterol, Total: 138 mg/dL (ref 100–199)
HDL: 67 mg/dL (ref >39)
LDL Chol Calc (NIH): 57 mg/dL (ref 0–99)
Triglycerides: 67 mg/dL (ref 0–149)
VLDL Cholesterol Cal: 14 mg/dL (ref 5–40)

## 2021-03-03 LAB — THYROID PANEL WITH TSH
Free Thyroxine Index: 2.5 (ref 1.2–4.9)
T3 Uptake Ratio: 27 % (ref 24–39)
T4, Total: 9.3 ug/dL (ref 4.5–12.0)
TSH: 3.76 u[IU]/mL (ref 0.450–4.500)

## 2021-03-03 NOTE — Progress Notes (Signed)
[place]  [phone]) [phone] Fax:(336) [phone]     ID: Darlene Gregory DOB: February 16, 1934  MR#: [id number]  HYW#:[id number]  Patient Care Team: Chevis Pretty, FNP as PCP - General (Nurse Practitioner) Excell Seltzer, MD (Inactive) as Consulting Physician (General Surgery) Delvin Hedeen, Virgie Dad, MD as Consulting Physician (Oncology) Eppie Gibson, MD as Attending Physician (Radiation Oncology) Arvella Nigh, MD as Consulting Physician (Obstetrics and Gynecology) Steffanie Rainwater, DPM as Consulting Physician (Podiatry) OTHER MD:  CHIEF COMPLAINT: Estrogen receptor positive breast cancer (s/p left mastectomy)  CURRENT TREATMENT: Tamoxifen   INTERVAL HISTORY: Darlene Gregory returns today for follow-up of her estrogen receptor positive breast cancer.  She is accompanied by her son Darlene Gregory  She continues on tamoxifen. She tolerates this with no side effects that she is aware of.  Since her last visit, she presented with a palpable abnormality in the right axilla. She underwent right diagnostic mammography and right breast ultrasonography at [facility] on 09/17/2020 showing: the palpable right axilla lump corresponds with a skin lesion, compatible with a benign sebaceous cyst; no evidence of malignancy.  REVIEW OF SYSTEMS: Darlene Gregory tells me her husband of 20 years just died 2021-03-05.  He had been under hospice for 1 year.  She was just primary caregiver.  She is of course still grieving appropriately.  She was married at the age of 66.  She has had a lot of visitors and of course her son is currently staying with her.  She feels she has gotten weaker over the past year while taking care of her husband.  She is going to have some physical therapy at home which is going to help.  She has had no falls.  A detailed review of systems was otherwise stable.   COVID 19 VACCINATION STATUS: Moderna x3   BREAST CANCER HISTORY: From the original intake note:   [name] had screening mammography  April 2018 showing a possible mass in the left breast. She was referred to the [facility] where she underwent left mammography and ultrasonography on 09/10/2016. This showed the breast density to be category C. In the upper outer quadrant of the breast there was an area of architectural distortion which was not palpable. Ultrasonography confirmed a 0.9 cm hypoechoic mass at the 1:30 o'clock position of the left breast 3 cm from the nipple. Ultrasound of the left axilla showed a suspicious lymph node.  On 09/24/2016 the patient underwent biopsy of the left breast mass and the left axillary lymph node in question. The lymph node was benign, and this was read as concordant by radiology. The left breast mass however showed invasive ductal carcinoma, grade 2, estrogen receptor 100% positive, progesterone receptor 95% positive, both with strong staining intensity, with an MIB-1 of 5%, and no HER-2 amplification, the signals ratio being 1.46 and the number per cell 1.90.  The patient's subsequent history is as detailed below   PAST MEDICAL HISTORY: Past Medical History:  Diagnosis Date   Cancer ([place]) 10/2016   left breast cancer   Difficult intubation    "small trachea"; 01/18/05: glidescope but unable to pass stylet -->fast track LMA with blind passage for ETT, consider awake intubation; 08/14/08: IV induction and DL with glidesecope for ETT   DJD (degenerative joint disease)    Frequent urination    GERD (gastroesophageal reflux disease)    Hypertension    Hypothyroidism    Osteoporosis    PONV (postoperative nausea and vomiting)    Thyroid disease     PAST  SURGICAL HISTORY: Past Surgical History:  Procedure Laterality Date   ABDOMINAL HYSTERECTOMY     BREAST LUMPECTOMY WITH RADIOACTIVE SEED LOCALIZATION Left 10/29/2016   Procedure: LEFT BREAST LUMPECTOMY WITH RADIOACTIVE SEED LOCALIZATION;  Surgeon: Excell Seltzer, MD;  Location: [facility];  Service: General;  Laterality:  Left;   CAROTID BODY TUMOR EXCISION     COLONOSCOPY W/ POLYPECTOMY     EYE SURGERY Bilateral    cataract removed   JOINT REPLACEMENT Right 2006   knee   MASTECTOMY Left 2018   PAROTIDECTOMY Left    RE-EXCISION OF BREAST LUMPECTOMY Left 11/05/2016   Procedure: RE-EXCISION OF LEFT BREAST LUMPECTOMY;  Surgeon: Excell Seltzer, MD;  Location: [facility];  Service: General;  Laterality: Left;   TOTAL KNEE ARTHROPLASTY Left 03/13/2014   Procedure: LEFT TOTAL KNEE ARTHROPLASTY;  Surgeon: Kerin Salen, MD;  Location: [facility];  Service: Orthopedics;  Laterality: Left;   TOTAL MASTECTOMY Left 11/12/2016   Procedure: LEFT TOTAL MASTECTOMY;  Surgeon: Excell Seltzer, MD;  Location: [facility];  Service: General;  Laterality: Left;    FAMILY HISTORY Family History  Problem Relation Age of Onset   Heart disease Mother    Emphysema Father    Cancer Sister        ovarian  The patient's father died at the age of 27 from emphysema. The patient's mother died from "old age" at 26. The patient had one brother, 3 sisters. There is no history of breast cancer in the family. One sister had "a female cancer", but the patient is not sure what it may have been.   GYNECOLOGIC HISTORY:  No LMP recorded. Patient has had a hysterectomy. The patient is status post total abdominal hysterectomy with bilateral salpingo-oophorectomy remotely. She is GX P2, first live birth age 62. She never used oral contraceptives.   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 Her husband Darlene Gregory has been a Psychologist, sport and exercise.  He died 11-Mar-2021.  They currently have 41 acres. Son Darlene Gregory, 56, lives in [place] and worked for [employer].  He is now retired.  Son Darlene Gregory lives in [place] and works for [employer]. The patient has 2 grandchildren and some great-grandchildren. She is a Psychologist, forensic.    ADVANCED DIRECTIVES: In place   HEALTH MAINTENANCE: Social History    Tobacco Use   Smoking status: Former   Smokeless tobacco: Never  Scientific laboratory technician Use: Never used  Substance Use Topics   Alcohol use: No   Drug use: No     Colonoscopy: Repeat due 2019/[place]  PAP:  Bone density: 04/20/2013, T score -2.3   Allergies  Allergen Reactions   Hyoscyamine Sulfate     REACTION: rash   Norvasc [Amlodipine Besylate] Swelling   Pyridium  [Phenazopyridine Hcl]    Phenazopyridine Hcl Rash   Sulfonamide Derivatives Rash    Current Outpatient Medications  Medication Sig Dispense Refill   ALPRAZolam (XANAX) 0.25 MG tablet Take 1 tablet (0.25 mg total) by mouth 2 (two) times daily as needed for anxiety. 30 tablet 5   aspirin EC 81 MG tablet Take 1 tablet (81 mg total) by mouth daily. 100 tablet 4   busPIRone (BUSPAR) 5 MG tablet Take 1 tablet (5 mg total) by mouth daily as needed. 30 tablet 5   Cholecalciferol (VITAMIN D) 2000 UNITS CAPS Take by mouth daily.     cloNIDine (CATAPRES) 0.2 MG tablet  Take 1 tablet (0.2 mg total) by mouth 2 (two) times daily. 180 tablet 1   Ferrous Sulfate (SLOW FE) 142 (45 Fe) MG TBCR Take 1 tablet by mouth daily. 90 tablet 1   FIBER PO Take 2 each by mouth daily.      fish oil-omega-3 fatty acids 1000 MG capsule Take 1 g by mouth daily.     furosemide (LASIX) 20 MG tablet Take 1 tablet (20 mg total) by mouth daily. 90 tablet 1   gabapentin (NEURONTIN) 100 MG capsule Take 1 capsule (100 mg total) by mouth 3 (three) times daily. 90 capsule 3   levothyroxine (EUTHYROX) 88 MCG tablet Take 1 tablet (88 mcg total) by mouth daily. 90 tablet 2   Multiple Vitamins-Minerals (PRESERVISION AREDS PO) Take by mouth.     olmesartan (BENICAR) 40 MG tablet Take 1 tablet (40 mg total) by mouth daily. 90 tablet 1   omeprazole (PRILOSEC) 20 MG capsule Take 1 capsule (20 mg total) by mouth daily. 90 capsule 1   predniSONE (DELTASONE) 20 MG tablet Take 2 tablets (40 mg total) by mouth daily with breakfast for 5 days. 2 po daily for 5 days 10  tablet 0   Sod Fluoride-Potassium Nitrate (PREVIDENT 5000 SENSITIVE) 1.1-5 % GEL PreviDent 5000 Enamel Protect 1.1 %-5 % dental paste  BRUSH WITH TOOTHPASTE MORNING AND NIGHT     sodium fluoride (FLUORISHIELD) 1.1 % GEL dental gel SF 5000 Plus 1.1 % dental cream     tamoxifen (NOLVADEX) 20 MG tablet Take 1 tablet (20 mg total) by mouth daily. 90 tablet 4   vitamin C (ASCORBIC ACID) 500 MG tablet Take 500 mg by mouth daily.     No current facility-administered medications for this visit.    OBJECTIVE: White woman who had great difficulty climbing to the examination table Vitals:   03/04/21 0949  BP: (!) 193/63  Pulse: (!) 58  Resp: 17  Temp: 97.6 F (36.4 C)  SpO2: 100%      Body mass index is 32.69 kg/m.    ECOG FS:1 - Symptomatic but completely ambulatory  Sclerae unicteric, EOMs intact Wearing a mask No cervical or supraclavicular adenopathy Lungs no rales or rhonchi Heart regular rate and rhythm Abd soft, nontender, positive bowel sounds MSK kyphosis but no focal spinal tenderness; using a cane Neuro: nonfocal, well oriented, appropriate affect Breasts: The right breast is benign per the left breast is status postmastectomy.  There is no evidence of chest wall recurrence.  Both axillae are benign.   LAB RESULTS:  CMP     Component Value Date/Time   NA 141 03/02/2021 0927   NA 141 03/14/2017 1056   K 4.2 03/02/2021 0927   K 4.6 03/14/2017 1056   CL 103 03/02/2021 0927   CO2 26 03/02/2021 0927   CO2 28 03/14/2017 1056   GLUCOSE 108 (H) 03/02/2021 0927   GLUCOSE 99 09/26/2017 0938   GLUCOSE 90 03/14/2017 1056   BUN 36 (H) 03/02/2021 0927   BUN 29.9 (H) 03/14/2017 1056   CREATININE 1.68 (H) 03/02/2021 0927   CREATININE 1.83 (H) 09/26/2017 0938   CREATININE 1.8 (H) 03/14/2017 1056   CALCIUM 9.3 03/02/2021 0927   CALCIUM 9.4 03/14/2017 1056   PROT 6.0 03/02/2021 0927   PROT 6.6 03/14/2017 1056   ALBUMIN 3.8 03/02/2021 0927   ALBUMIN 3.4 (L) 03/14/2017 1056    AST 11 03/02/2021 0927   AST 15 09/26/2017 0938   AST 14 03/14/2017 1056   ALT  9 03/02/2021 0927   ALT 15 09/26/2017 0938   ALT 11 03/14/2017 1056   ALKPHOS 80 03/02/2021 0927   ALKPHOS 69 03/14/2017 1056   BILITOT 0.4 03/02/2021 0927   BILITOT 0.3 09/26/2017 0938   BILITOT 0.38 03/14/2017 1056   GFRNONAA 29 (L) 02/26/2020 0850   GFRNONAA 25 (L) 09/26/2017 0938   GFRNONAA 49 (L) 11/24/2012 0823   GFRAA 33 (L) 02/26/2020 0850   GFRAA 28 (L) 09/26/2017 0938   GFRAA 56 (L) 11/24/2012 0823    No results found for: Ronnald Ramp, A1GS, A2GS, BETS, BETA2SER, GAMS, MSPIKE, SPEI  No results found for: N[facility] P[facility], [facility]  Lab Results  Component Value Date   WBC 6.6 03/02/2021   NEUTROABS 4.2 03/02/2021   HGB 11.2 03/02/2021   HCT 34.0 03/02/2021   MCV 96 03/02/2021   PLT 123 (L) 03/02/2021      Chemistry      Component Value Date/Time   NA 141 03/02/2021 0927   NA 141 03/14/2017 1056   K 4.2 03/02/2021 0927   K 4.6 03/14/2017 1056   CL 103 03/02/2021 0927   CO2 26 03/02/2021 0927   CO2 28 03/14/2017 1056   BUN 36 (H) 03/02/2021 0927   BUN 29.9 (H) 03/14/2017 1056   CREATININE 1.68 (H) 03/02/2021 0927   CREATININE 1.83 (H) 09/26/2017 0938   CREATININE 1.8 (H) 03/14/2017 1056      Component Value Date/Time   CALCIUM 9.3 03/02/2021 0927   CALCIUM 9.4 03/14/2017 1056   ALKPHOS 80 03/02/2021 0927   ALKPHOS 69 03/14/2017 1056   AST 11 03/02/2021 0927   AST 15 09/26/2017 0938   AST 14 03/14/2017 1056   ALT 9 03/02/2021 0927   ALT 15 09/26/2017 0938   ALT 11 03/14/2017 1056   BILITOT 0.4 03/02/2021 0927   BILITOT 0.3 09/26/2017 0938   BILITOT 0.38 03/14/2017 1056       No results found for: LABCA2  No components found for: [id number]  No results for input(s): INR in the last 168 hours.  Urinalysis    Component Value Date/Time   APPEARANCEUR Clear 11/27/2015 1132   GLUCOSEU Negative 11/27/2015 1132   BILIRUBINUR Negative  11/27/2015 1132   PROTEINUR Negative 11/27/2015 1132   NITRITE Negative 11/27/2015 1132   LEUKOCYTESUR 1+ (A) 11/27/2015 1132    STUDIES: DG Shoulder Right  Result Date: 03/02/2021 CLINICAL DATA:  Right-sided shoulder pain EXAM: RIGHT SHOULDER - 2+ VIEW COMPARISON:  10/27/2018 FINDINGS: No fracture or malalignment. AC joint is intact. Right lung apex is clear. Mild AC joint degenerative change IMPRESSION: Mild AC joint degenerative change.  Otherwise negative. Electronically Signed   By: Donavan Foil M.D.   On: 03/02/2021 23:11     ELIGIBLE FOR AVAILABLE RESEARCH PROTOCOL: no  ASSESSMENT: 87 y.o. [place], [place] woman status post left breast upper outer quadrant biopsy 09/24/2016 for a clinical T1B N0, stage IA invasive ductal carcinoma, grade 2, estrogen and progesterone receptor positive, HER-2 not amplified, with an MIB-1 of 5%  (1) Status post left lumpectomy 10/29/2016 for a pT1c pNX, stage IA invasive ductal carcinoma, grade 2, with positive margins  (a) additional surgery 11/05/2016 removed additional ductal carcinoma in situ, but margins were still positive  (b) left mastectomy with sentinel lymph node sampling 11/12/2016 showed pTis pN0, with negative margins  (2) adjuvant radiation not indicated  (3) tamoxifen started 11/30/2016.  (a) bone density December 2014 shows a T score of -2.3   PLAN: Darlene Gregory  is now a little over 4 years out from definitive surgery for her breast cancer with no evidence of disease recurrence.  This is very favorable.  She is tolerating tamoxifen well.  She will complete 5 years next summer.  When she sees is next July as she will be ready to "graduate" from follow-up here.  She will have her mammogram on the right shortly before that visit  They know to call for any other issue that may develop before then  Total encounter time 25 minutes.*   Aerial Dilley, Virgie Dad, MD  03/04/21 10:22 AM Medical Oncology and Hematology [facility] [place], [place] [address] Tel. [phone]    Fax. [phone]   I, Wilburn Mylar, am acting as scribe for Dr. Virgie Dad. Emeric Novinger.  I, Lurline Del MD, have reviewed the above documentation for accuracy and completeness, and I agree with the above.   *Total Encounter Time as defined by the Centers for Medicare and Medicaid Services includes, in addition to the face-to-face time of a patient visit (documented in the note above) non-face-to-face time: obtaining and reviewing outside history, ordering and reviewing medications, tests or procedures, care coordination (communications with other health care professionals or caregivers) and documentation in the medical record.

## 2021-03-04 ENCOUNTER — Inpatient Hospital Stay: Payer: Medicare PPO | Attending: Oncology | Admitting: Oncology

## 2021-03-04 ENCOUNTER — Other Ambulatory Visit: Payer: Self-pay

## 2021-03-04 VITALS — BP 193/63 | HR 58 | Temp 97.6°F | Resp 17 | Ht 61.0 in | Wt 173.0 lb

## 2021-03-04 DIAGNOSIS — Z90722 Acquired absence of ovaries, bilateral: Secondary | ICD-10-CM | POA: Insufficient documentation

## 2021-03-04 DIAGNOSIS — Z8041 Family history of malignant neoplasm of ovary: Secondary | ICD-10-CM | POA: Insufficient documentation

## 2021-03-04 DIAGNOSIS — Z882 Allergy status to sulfonamides status: Secondary | ICD-10-CM | POA: Diagnosis not present

## 2021-03-04 DIAGNOSIS — Z8249 Family history of ischemic heart disease and other diseases of the circulatory system: Secondary | ICD-10-CM | POA: Diagnosis not present

## 2021-03-04 DIAGNOSIS — E039 Hypothyroidism, unspecified: Secondary | ICD-10-CM | POA: Insufficient documentation

## 2021-03-04 DIAGNOSIS — K219 Gastro-esophageal reflux disease without esophagitis: Secondary | ICD-10-CM | POA: Diagnosis not present

## 2021-03-04 DIAGNOSIS — C50412 Malignant neoplasm of upper-outer quadrant of left female breast: Secondary | ICD-10-CM | POA: Diagnosis not present

## 2021-03-04 DIAGNOSIS — Z170 Estrogen receptor positive status [ER+]: Secondary | ICD-10-CM | POA: Diagnosis not present

## 2021-03-04 DIAGNOSIS — Z836 Family history of other diseases of the respiratory system: Secondary | ICD-10-CM | POA: Diagnosis not present

## 2021-03-04 DIAGNOSIS — Z79810 Long term (current) use of selective estrogen receptor modulators (SERMs): Secondary | ICD-10-CM | POA: Insufficient documentation

## 2021-03-04 DIAGNOSIS — Z9012 Acquired absence of left breast and nipple: Secondary | ICD-10-CM | POA: Diagnosis not present

## 2021-03-04 DIAGNOSIS — I10 Essential (primary) hypertension: Secondary | ICD-10-CM | POA: Diagnosis not present

## 2021-03-09 ENCOUNTER — Telehealth: Payer: Self-pay | Admitting: Nurse Practitioner

## 2021-03-09 NOTE — Telephone Encounter (Signed)
Pt calling to check on her referral. Please call back and advise.

## 2021-03-17 ENCOUNTER — Telehealth: Payer: Self-pay | Admitting: Nurse Practitioner

## 2021-03-17 NOTE — Telephone Encounter (Signed)
Pt told [employer] that she is receiving physical therapy at home and they have not had a prior authorization for physical therapy, only for an Therapist, sports. Please call back.

## 2021-03-27 ENCOUNTER — Other Ambulatory Visit: Payer: Self-pay

## 2021-03-27 ENCOUNTER — Ambulatory Visit (INDEPENDENT_AMBULATORY_CARE_PROVIDER_SITE_OTHER): Payer: Medicare PPO

## 2021-03-27 DIAGNOSIS — D630 Anemia in neoplastic disease: Secondary | ICD-10-CM

## 2021-03-27 DIAGNOSIS — M1009 Idiopathic gout, multiple sites: Secondary | ICD-10-CM | POA: Diagnosis not present

## 2021-03-27 DIAGNOSIS — K649 Unspecified hemorrhoids: Secondary | ICD-10-CM

## 2021-03-27 DIAGNOSIS — C50412 Malignant neoplasm of upper-outer quadrant of left female breast: Secondary | ICD-10-CM | POA: Diagnosis not present

## 2021-03-27 DIAGNOSIS — D631 Anemia in chronic kidney disease: Secondary | ICD-10-CM | POA: Diagnosis not present

## 2021-03-27 DIAGNOSIS — M25511 Pain in right shoulder: Secondary | ICD-10-CM

## 2021-03-27 DIAGNOSIS — I129 Hypertensive chronic kidney disease with stage 1 through stage 4 chronic kidney disease, or unspecified chronic kidney disease: Secondary | ICD-10-CM | POA: Diagnosis not present

## 2021-03-27 DIAGNOSIS — N1832 Chronic kidney disease, stage 3b: Secondary | ICD-10-CM

## 2021-03-27 DIAGNOSIS — K5730 Diverticulosis of large intestine without perforation or abscess without bleeding: Secondary | ICD-10-CM

## 2021-03-27 DIAGNOSIS — M810 Age-related osteoporosis without current pathological fracture: Secondary | ICD-10-CM | POA: Diagnosis not present

## 2021-03-27 DIAGNOSIS — D509 Iron deficiency anemia, unspecified: Secondary | ICD-10-CM

## 2021-03-27 DIAGNOSIS — F411 Generalized anxiety disorder: Secondary | ICD-10-CM

## 2021-03-27 DIAGNOSIS — Z170 Estrogen receptor positive status [ER+]: Secondary | ICD-10-CM | POA: Diagnosis not present

## 2021-03-27 DIAGNOSIS — Z79810 Long term (current) use of selective estrogen receptor modulators (SERMs): Secondary | ICD-10-CM

## 2021-03-27 DIAGNOSIS — K219 Gastro-esophageal reflux disease without esophagitis: Secondary | ICD-10-CM

## 2021-03-27 DIAGNOSIS — Z7982 Long term (current) use of aspirin: Secondary | ICD-10-CM

## 2021-03-27 DIAGNOSIS — K449 Diaphragmatic hernia without obstruction or gangrene: Secondary | ICD-10-CM

## 2021-03-27 DIAGNOSIS — E034 Atrophy of thyroid (acquired): Secondary | ICD-10-CM

## 2021-04-19 ENCOUNTER — Other Ambulatory Visit: Payer: Self-pay | Admitting: Oncology

## 2021-05-05 DIAGNOSIS — L03031 Cellulitis of right toe: Secondary | ICD-10-CM | POA: Diagnosis not present

## 2021-05-05 DIAGNOSIS — M79674 Pain in right toe(s): Secondary | ICD-10-CM | POA: Diagnosis not present

## 2021-05-06 DIAGNOSIS — R32 Unspecified urinary incontinence: Secondary | ICD-10-CM | POA: Diagnosis not present

## 2021-05-06 DIAGNOSIS — R609 Edema, unspecified: Secondary | ICD-10-CM | POA: Diagnosis not present

## 2021-05-06 DIAGNOSIS — E669 Obesity, unspecified: Secondary | ICD-10-CM | POA: Diagnosis not present

## 2021-05-06 DIAGNOSIS — C50919 Malignant neoplasm of unspecified site of unspecified female breast: Secondary | ICD-10-CM | POA: Diagnosis not present

## 2021-05-06 DIAGNOSIS — K219 Gastro-esophageal reflux disease without esophagitis: Secondary | ICD-10-CM | POA: Diagnosis not present

## 2021-05-06 DIAGNOSIS — I10 Essential (primary) hypertension: Secondary | ICD-10-CM | POA: Diagnosis not present

## 2021-05-06 DIAGNOSIS — Z7982 Long term (current) use of aspirin: Secondary | ICD-10-CM | POA: Diagnosis not present

## 2021-05-06 DIAGNOSIS — Z79810 Long term (current) use of selective estrogen receptor modulators (SERMs): Secondary | ICD-10-CM | POA: Diagnosis not present

## 2021-05-06 DIAGNOSIS — Z6831 Body mass index (BMI) 31.0-31.9, adult: Secondary | ICD-10-CM | POA: Diagnosis not present

## 2021-05-19 DIAGNOSIS — L03031 Cellulitis of right toe: Secondary | ICD-10-CM | POA: Diagnosis not present

## 2021-05-19 DIAGNOSIS — M79674 Pain in right toe(s): Secondary | ICD-10-CM | POA: Diagnosis not present

## 2021-05-23 ENCOUNTER — Other Ambulatory Visit: Payer: Self-pay | Admitting: Oncology

## 2021-05-23 NOTE — Telephone Encounter (Signed)
Seen 10/26 with Dr. Jana Hakim. Gardiner Rhyme, RN

## 2021-05-26 ENCOUNTER — Other Ambulatory Visit: Payer: Self-pay | Admitting: Nurse Practitioner

## 2021-05-26 DIAGNOSIS — I10 Essential (primary) hypertension: Secondary | ICD-10-CM

## 2021-07-27 ENCOUNTER — Emergency Department (HOSPITAL_COMMUNITY): Payer: Medicare PPO

## 2021-07-27 ENCOUNTER — Encounter (HOSPITAL_COMMUNITY): Payer: Self-pay | Admitting: *Deleted

## 2021-07-27 ENCOUNTER — Emergency Department (HOSPITAL_COMMUNITY)
Admission: EM | Admit: 2021-07-27 | Discharge: 2021-07-27 | Disposition: A | Discharge status: Home / Self Care | Payer: Medicare PPO | Attending: Emergency Medicine | Admitting: Emergency Medicine

## 2021-07-27 DIAGNOSIS — Z79899 Other long term (current) drug therapy: Secondary | ICD-10-CM | POA: Diagnosis not present

## 2021-07-27 DIAGNOSIS — I10 Essential (primary) hypertension: Secondary | ICD-10-CM | POA: Diagnosis not present

## 2021-07-27 DIAGNOSIS — R519 Headache, unspecified: Secondary | ICD-10-CM | POA: Diagnosis not present

## 2021-07-27 DIAGNOSIS — H43393 Other vitreous opacities, bilateral: Secondary | ICD-10-CM | POA: Diagnosis not present

## 2021-07-27 DIAGNOSIS — E039 Hypothyroidism, unspecified: Secondary | ICD-10-CM | POA: Diagnosis not present

## 2021-07-27 DIAGNOSIS — I6529 Occlusion and stenosis of unspecified carotid artery: Secondary | ICD-10-CM | POA: Diagnosis not present

## 2021-07-27 DIAGNOSIS — Z7982 Long term (current) use of aspirin: Secondary | ICD-10-CM | POA: Diagnosis not present

## 2021-07-27 MED ORDER — ACETAMINOPHEN 325 MG PO TABS
650.0000 mg | ORAL_TABLET | Freq: Once | ORAL | Status: AC
  Administered 2021-07-27: 650 mg via ORAL
  Filled 2021-07-27: qty 2

## 2021-07-27 NOTE — Discharge Instructions (Addendum)
CT scan of your brain did not show any acute pathology. ? ?Call your primary care doctor or specialist as discussed in the next 2-3 days.   ?Return immediately back to the ER if: ? ?Your symptoms worsen within the next 12-24 hours. ?You develop new symptoms such as new fevers, persistent vomiting, new pain, shortness of breath, or new weakness or numbness, or if you have any other concerns. ? ?

## 2021-07-27 NOTE — ED Triage Notes (Signed)
Headache and seeing floaters for the past 2 weeks, states she needs an MRI ?

## 2021-07-27 NOTE — ED Provider Notes (Signed)
?[facility] ?Provider Note ? ? ?CSN: [id number] ?Arrival date & time: 07/27/21  1047 ? ?  ? ?History ? ?Chief Complaint  ?Patient presents with  ? Headache  ? ? ?Darlene Gregory is a 88 y.o. female. ? ?ShePatient presents to ER chief complaint of headache and floaters.  She states that she is been seeing floaters from both eyes ongoing for the past 2 weeks unchanged.  She is also had a headache for about 2 weeks.  Describes as gradual onset headache similar time she started seeing floaters.  Otherwise denies any visual field defects, denies curtain like vision loss, denies flashes of light.  Headache is unchanged and ongoing for 2 weeks as well.  No reports of fevers or cough positive nausea no vomiting reported.  She states she has very poor vision at baseline and had last seen an ophthalmologist or optometrist about 2 to 3 years ago and was told "there is nothing more we can do."  She otherwise continues to wear her regular glasses bifocals. ? ? ?  ? ?Home Medications ?Prior to Admission medications   ?Medication Sig Start Date End Date Taking? Authorizing Provider  ?ALPRAZolam (XANAX) 0.25 MG tablet Take 1 tablet (0.25 mg total) by mouth 2 (two) times daily as needed for anxiety. 03/02/21  Yes Hassell Done, Mary-Margaret, FNP  ?aspirin EC 81 MG tablet Take 1 tablet (81 mg total) by mouth daily. 11/30/16  Yes Magrinat, Virgie Dad, MD  ?busPIRone (BUSPAR) 5 MG tablet Take 1 tablet (5 mg total) by mouth daily as needed. 03/02/21  Yes Hassell Done, Mary-Margaret, FNP  ?cloNIDine (CATAPRES) 0.2 MG tablet Take 1 tablet by mouth twice daily 05/27/21  Yes Chevis Pretty, FNP  ?FIBER PO Take 2 each by mouth daily.    Yes [provider]  ?fish oil-omega-3 fatty acids 1000 MG capsule Take 1 g by mouth daily.   Yes [provider]  ?furosemide (LASIX) 20 MG tablet Take 1 tablet (20 mg total) by mouth daily. 03/02/21  Yes Chevis Pretty, FNP  ?levothyroxine (EUTHYROX) 88 MCG tablet Take 1  tablet (88 mcg total) by mouth daily. 03/02/21  Yes Chevis Pretty, FNP  ?Multiple Vitamins-Minerals (PRESERVISION AREDS PO) Take by mouth.   Yes [provider]  ?olmesartan (BENICAR) 40 MG tablet Take 1 tablet (40 mg total) by mouth daily. 03/02/21  Yes Chevis Pretty, FNP  ?tamoxifen (NOLVADEX) 20 MG tablet Take 1 tablet by mouth once daily 05/23/21  Yes Causey, Charlestine Massed, NP  ?vitamin C (ASCORBIC ACID) 500 MG tablet Take 500 mg by mouth daily.   Yes [provider]  ?Ferrous Sulfate (SLOW FE) 142 (45 Fe) MG TBCR Take 1 tablet by mouth daily. ?Patient not taking: Reported on 07/27/2021 03/02/21   Chevis Pretty, FNP  ?gabapentin (NEURONTIN) 100 MG capsule Take 1 capsule (100 mg total) by mouth 3 (three) times daily. ?Patient not taking: Reported on 07/27/2021 03/02/21   Chevis Pretty, FNP  ?omeprazole (PRILOSEC) 20 MG capsule Take 1 capsule (20 mg total) by mouth daily. ?Patient not taking: Reported on 07/27/2021 03/02/21   Chevis Pretty, FNP  ?   ? ?Allergies    ?Hyoscyamine sulfate, Norvasc [amlodipine besylate], Pyridium  [phenazopyridine hcl], Phenazopyridine hcl, and Sulfonamide derivatives   ? ?Review of Systems   ?Review of Systems  ?Constitutional:  Negative for fever.  ?HENT:  Negative for ear pain.   ?Eyes:  Negative for pain.  ?Respiratory:  Negative for cough.   ?Cardiovascular:  Negative for chest  pain.  ?Gastrointestinal:  Negative for abdominal pain.  ?Genitourinary:  Negative for flank pain.  ?Musculoskeletal:  Negative for back pain.  ?Skin:  Negative for rash.  ?Neurological:  Positive for headaches.  ? ?Physical Exam ?Updated Vital Signs ?BP (!) 178/67 (BP Location: Right Arm)   Pulse (!) 57   Temp 98.8 ?F (37.1 ?C) (Oral)   Resp 20   Ht '5\' 3"'$  (1.6 m)   Wt 78 kg   SpO2 100%   BMI 30.47 kg/m?  ?Physical Exam ?Constitutional:   ?   General: She is not in acute distress. ?   Appearance: Normal appearance.  ?HENT:  ?   Head:  Normocephalic.  ?   Nose: Nose normal.  ?Eyes:  ?   General: No scleral icterus. ?   Extraocular Movements: Extraocular movements intact.  ?   Pupils: Pupils are equal, round, and reactive to light. Pupils are equal.  ?Cardiovascular:  ?   Rate and Rhythm: Normal rate.  ?Pulmonary:  ?   Effort: Pulmonary effort is normal.  ?Musculoskeletal:     ?   General: Normal range of motion.  ?   Cervical back: Normal range of motion.  ?Neurological:  ?   General: No focal deficit present.  ?   Mental Status: She is alert. Mental status is at baseline.  ? ? ?ED Results / Procedures / Treatments   ?Labs ?(all labs ordered are listed, but only abnormal results are displayed) ?Labs Reviewed - No data to display ? ?EKG ?None ? ?Radiology ?CT Head Wo Contrast ? ?Result Date: 07/27/2021 ?CLINICAL DATA:  TIA EXAM: CT HEAD WITHOUT CONTRAST TECHNIQUE: Contiguous axial images were obtained from the base of the skull through the vertex without intravenous contrast. RADIATION DOSE REDUCTION: This exam was performed according to the departmental dose-optimization program which includes automated exposure control, adjustment of the mA and/or kV according to patient size and/or use of iterative reconstruction technique. COMPARISON:  CT head 02/26/2020 FINDINGS: Brain: No acute intracranial hemorrhage, mass effect, or herniation. No extra-axial fluid collections. No evidence of acute territorial infarct. No hydrocephalus. Mild cortical volume loss. Patchy hypodensities throughout the periventricular and subcortical white matter, likely secondary to chronic microvascular ischemic changes. Vascular: Calcified plaques in the carotid siphons. Skull: Normal. Negative for fracture or focal lesion. Sinuses/Orbits: No acute finding. Other: None. IMPRESSION: Chronic changes with no acute intracranial process identified. Electronically Signed   By: Ofilia Neas M.D.   On: 07/27/2021 12:38   ? ?Procedures ?Procedures  ? ? ?Medications Ordered in  ED ?Medications  ?acetaminophen (TYLENOL) tablet 650 mg (650 mg Oral Given 07/27/21 1359)  ? ? ?ED Course/ Medical Decision Making/ A&P ?  ?                        ?Medical Decision Making ?Amount and/or Complexity of Data Reviewed ?Radiology: ordered. ? ?Risk ?OTC drugs. ? ? ?Review of record shows for medicine practice visit March 17, 2021. ? ?Patient on the monitor showing sinus rhythm. ? ?Work-up today included CT scan of the brain which shows no acute findings per radiologist. ? ?Differential included subarachnoid hemorrhage, meningitis, retinal pathology, other.  I doubt subarachnoid hemorrhage given duration of symptoms, negative scan and gradual onset of headaches. ? ?We will recommend outpatient follow-up with ophthalmology within the week.  Recommending immediate return for worsening symptoms or any additional concerns. ? ? ? ? ? ? ? ?Final Clinical Impression(s) / ED Diagnoses ?Final diagnoses:  ?  Acute nonintractable headache, unspecified headache type  ?Vitreous floaters of both eyes  ? ? ?Rx / DC Orders ?ED Discharge Orders   ? ? None  ? ?  ? ? ?  ?Luna Fuse, MD ?07/27/21 1409 ? ?

## 2021-07-29 ENCOUNTER — Encounter (INDEPENDENT_AMBULATORY_CARE_PROVIDER_SITE_OTHER): Payer: Medicare PPO | Admitting: Ophthalmology

## 2021-07-29 ENCOUNTER — Ambulatory Visit: Payer: Medicare PPO | Admitting: Nurse Practitioner

## 2021-07-29 ENCOUNTER — Other Ambulatory Visit: Payer: Self-pay

## 2021-07-29 DIAGNOSIS — H43813 Vitreous degeneration, bilateral: Secondary | ICD-10-CM | POA: Diagnosis not present

## 2021-07-29 DIAGNOSIS — H353134 Nonexudative age-related macular degeneration, bilateral, advanced atrophic with subfoveal involvement: Secondary | ICD-10-CM

## 2021-07-29 DIAGNOSIS — I10 Essential (primary) hypertension: Secondary | ICD-10-CM | POA: Diagnosis not present

## 2021-07-29 DIAGNOSIS — H35033 Hypertensive retinopathy, bilateral: Secondary | ICD-10-CM | POA: Diagnosis not present

## 2021-07-30 ENCOUNTER — Telehealth: Payer: Self-pay | Admitting: Nurse Practitioner

## 2021-07-31 NOTE — Telephone Encounter (Signed)
Patient aware she ntbs for labs and will call back to schedule.  ?

## 2021-08-04 ENCOUNTER — Ambulatory Visit: Payer: Medicare PPO | Admitting: Nurse Practitioner

## 2021-08-04 ENCOUNTER — Encounter: Payer: Self-pay | Admitting: Nurse Practitioner

## 2021-08-04 VITALS — BP 155/69 | HR 47 | Temp 98.6°F | Resp 20 | Ht 63.0 in | Wt 167.0 lb

## 2021-08-04 DIAGNOSIS — N3000 Acute cystitis without hematuria: Secondary | ICD-10-CM

## 2021-08-04 DIAGNOSIS — R600 Localized edema: Secondary | ICD-10-CM

## 2021-08-04 DIAGNOSIS — E034 Atrophy of thyroid (acquired): Secondary | ICD-10-CM

## 2021-08-04 DIAGNOSIS — D509 Iron deficiency anemia, unspecified: Secondary | ICD-10-CM | POA: Diagnosis not present

## 2021-08-04 DIAGNOSIS — F411 Generalized anxiety disorder: Secondary | ICD-10-CM

## 2021-08-04 DIAGNOSIS — Z6832 Body mass index (BMI) 32.0-32.9, adult: Secondary | ICD-10-CM

## 2021-08-04 DIAGNOSIS — K219 Gastro-esophageal reflux disease without esophagitis: Secondary | ICD-10-CM

## 2021-08-04 DIAGNOSIS — K5730 Diverticulosis of large intestine without perforation or abscess without bleeding: Secondary | ICD-10-CM | POA: Diagnosis not present

## 2021-08-04 DIAGNOSIS — I10 Essential (primary) hypertension: Secondary | ICD-10-CM | POA: Diagnosis not present

## 2021-08-04 DIAGNOSIS — M1009 Idiopathic gout, multiple sites: Secondary | ICD-10-CM | POA: Diagnosis not present

## 2021-08-04 DIAGNOSIS — R609 Edema, unspecified: Secondary | ICD-10-CM | POA: Diagnosis not present

## 2021-08-04 DIAGNOSIS — N1832 Chronic kidney disease, stage 3b: Secondary | ICD-10-CM | POA: Diagnosis not present

## 2021-08-04 DIAGNOSIS — R82998 Other abnormal findings in urine: Secondary | ICD-10-CM

## 2021-08-04 DIAGNOSIS — M810 Age-related osteoporosis without current pathological fracture: Secondary | ICD-10-CM

## 2021-08-04 LAB — URINALYSIS, COMPLETE
Bilirubin, UA: NEGATIVE
Glucose, UA: NEGATIVE
Nitrite, UA: POSITIVE — AB
RBC, UA: NEGATIVE
Specific Gravity, UA: 1.015 (ref 1.005–1.030)
Urobilinogen, Ur: 0.2 mg/dL (ref 0.2–1.0)
pH, UA: 5.5 (ref 5.0–7.5)

## 2021-08-04 LAB — MICROSCOPIC EXAMINATION
Epithelial Cells (non renal): NONE SEEN /hpf (ref 0–10)
RBC, Urine: NONE SEEN /hpf (ref 0–2)
Renal Epithel, UA: NONE SEEN /hpf

## 2021-08-04 MED ORDER — BUSPIRONE HCL 5 MG PO TABS: 5.0000 mg | ORAL_TABLET | Freq: Every day | ORAL | 5 refills | Status: DC | PRN

## 2021-08-04 MED ORDER — OLMESARTAN MEDOXOMIL 40 MG PO TABS: 40.0000 mg | ORAL_TABLET | Freq: Every day | ORAL | 1 refills | Status: DC

## 2021-08-04 MED ORDER — LEVOTHYROXINE SODIUM 88 MCG PO TABS: 88.0000 ug | ORAL_TABLET | Freq: Every day | ORAL | 1 refills | Status: DC

## 2021-08-04 MED ORDER — OMEPRAZOLE 20 MG PO CPDR: 20.0000 mg | DELAYED_RELEASE_CAPSULE | Freq: Every day | ORAL | 1 refills | Status: DC

## 2021-08-04 MED ORDER — CEPHALEXIN 500 MG PO CAPS: 500.0000 mg | ORAL_CAPSULE | Freq: Two times a day (BID) | ORAL | 0 refills | Status: DC

## 2021-08-04 MED ORDER — CLONIDINE HCL 0.2 MG PO TABS: 0.2000 mg | ORAL_TABLET | Freq: Two times a day (BID) | ORAL | 1 refills | Status: DC

## 2021-08-04 MED ORDER — FUROSEMIDE 20 MG PO TABS: 20.0000 mg | ORAL_TABLET | Freq: Every day | ORAL | 1 refills | Status: DC

## 2021-08-04 MED ORDER — ALPRAZOLAM 0.25 MG PO TABS: 0.2500 mg | ORAL_TABLET | Freq: Two times a day (BID) | ORAL | 5 refills | Status: DC | PRN

## 2021-08-04 NOTE — Progress Notes (Signed)
? ?Subjective:  ? ? Patient ID: Darlene Gregory, female    DOB: February 25, 1934, 88 y.o.   MRN: [id number] ? ? ?Chief Complaint: Medical Management of Chronic Issues (Having floaters all the time/) ?  ? ?HPI: ? ?Darlene Gregory is a 88 y.o. who identifies as a female who was assigned female at birth.  ? ?Social history: ?Lives with: by herself ?Work history: retired from [employer]  ? ? ?Comes in today for follow up of the following chronic medical issues: ? ?1. Dark urine ?Has had slight dysuria. ? ?2. Essential hypertension, benign ?No c/o chest pain, sob or headache. Does not check blood pressure at home. ?BP Readings from Last 3 Encounters:  ?08/04/21 (!) 155/69  ?07/27/21 (!) 178/67  ?03/04/21 (!) 193/63  ? ? ? ?3. Hypothyroidism due to acquired atrophy of thyroid ?No problems that aware of ?Lab Results  ?Component Value Date  ? TSH 3.760 03/02/2021  ? ? ? ?4. Gastroesophageal reflux disease without esophagitis ?Is ion omeprazole daily and is doing well. ? ?5. Diverticulosis of colon ?No recent flare ups ? ?6. Stage 3b chronic kidney disease ([place]) ?Lab Results  ?Component Value Date  ? CREATININE 1.68 (H) 03/02/2021  ? ? ? ?7. Peripheral edema ?Has some daily but is not bad ? ?8. Iron deficiency anemia, unspecified iron deficiency anemia type ?Does have some fatigue ?Lab Results  ?Component Value Date  ? HGB 11.2 03/02/2021  ? ? ? ?9. Acute idiopathic gout of multiple sites ?No recent flare ups ? ?10. Age-related osteoporosis without current pathological fracture ?No weight bearig exercise. She has not had a recent dexascan and does not want to do today. ? ?11. GAD (generalized anxiety disorder) ?Is experiencing a lot of anxiety. Her husband  passed several months ago and her granddaughter recently passed ? ?  08/04/2021  ? 11:05 AM 08/27/2020  ?  8:46 AM 02/26/2020  ?  8:16 AM 05/24/2019  ?  8:38 AM  ?GAD 7 : Generalized Anxiety Score  ?Nervous, Anxious, on Edge 0 0 1 1  ?Control/stop worrying 0 0 3 0  ?Worry too much  - different things 0 0 3 0  ?Trouble relaxing 0 0 0 0  ?Restless 0 0 0 0  ?Easily annoyed or irritable 0 0 0 0  ?Afraid - awful might happen 0 0 0 0  ?Total GAD 7 Score 0 0 7 1  ?Anxiety Difficulty Not difficult at all Not difficult at all Not difficult at all Not difficult at all  ? ? ? ? ?12. BMI 32.0-32.9,adult ?Weight is down 5 lbs ?Wt Readings from Last 3 Encounters:  ?08/04/21 167 lb (75.8 kg)  ?07/27/21 172 lb (78 kg)  ?03/04/21 173 lb (78.5 kg)  ? ?BMI Readings from Last 3 Encounters:  ?08/04/21 29.58 kg/m?  ?07/27/21 30.47 kg/m?  ?03/04/21 32.69 kg/m?  ? ? ? ?New complaints: ?Has had floaters in her vision and has ben to the eye doctor. ? ?Allergies  ?Allergen Reactions  ? Hyoscyamine Sulfate   ?  REACTION: rash  ? Norvasc [Amlodipine Besylate] Swelling  ? Pyridium  [Phenazopyridine Hcl]   ? Phenazopyridine Hcl Rash  ? Sulfonamide Derivatives Rash  ? ?Outpatient Encounter Medications as of 08/04/2021  ?Medication Sig  ? ALPRAZolam (XANAX) 0.25 MG tablet Take 1 tablet (0.25 mg total) by mouth 2 (two) times daily as needed for anxiety.  ? aspirin EC 81 MG tablet Take 1 tablet (81 mg total) by mouth daily.  ? busPIRone (  BUSPAR) 5 MG tablet Take 1 tablet (5 mg total) by mouth daily as needed.  ? cloNIDine (CATAPRES) 0.2 MG tablet Take 1 tablet by mouth twice daily  ? Ferrous Sulfate (SLOW FE) 142 (45 Fe) MG TBCR Take 1 tablet by mouth daily.  ? FIBER PO Take 2 each by mouth daily.   ? fish oil-omega-3 fatty acids 1000 MG capsule Take 1 g by mouth daily.  ? furosemide (LASIX) 20 MG tablet Take 1 tablet (20 mg total) by mouth daily.  ? gabapentin (NEURONTIN) 100 MG capsule Take 1 capsule (100 mg total) by mouth 3 (three) times daily.  ? levothyroxine (EUTHYROX) 88 MCG tablet Take 1 tablet (88 mcg total) by mouth daily.  ? Multiple Vitamins-Minerals (PRESERVISION AREDS PO) Take by mouth.  ? olmesartan (BENICAR) 40 MG tablet Take 1 tablet (40 mg total) by mouth daily.  ? omeprazole (PRILOSEC) 20 MG capsule Take 1  capsule (20 mg total) by mouth daily.  ? tamoxifen (NOLVADEX) 20 MG tablet Take 1 tablet by mouth once daily  ? vitamin C (ASCORBIC ACID) 500 MG tablet Take 500 mg by mouth daily.  ? ?No facility-administered encounter medications on file as of 08/04/2021.  ? ? ?Past Surgical History:  ?Procedure Laterality Date  ? ABDOMINAL HYSTERECTOMY    ? BREAST LUMPECTOMY WITH RADIOACTIVE SEED LOCALIZATION Left 10/29/2016  ? Procedure: LEFT BREAST LUMPECTOMY WITH RADIOACTIVE SEED LOCALIZATION;  Surgeon: Excell Seltzer, MD;  Location: [facility];  Service: General;  Laterality: Left;  ? CAROTID BODY TUMOR EXCISION    ? COLONOSCOPY W/ POLYPECTOMY    ? EYE SURGERY Bilateral   ? cataract removed  ? JOINT REPLACEMENT Right 2006  ? knee  ? MASTECTOMY Left 2018  ? PAROTIDECTOMY Left   ? RE-EXCISION OF BREAST LUMPECTOMY Left 11/05/2016  ? Procedure: RE-EXCISION OF LEFT BREAST LUMPECTOMY;  Surgeon: Excell Seltzer, MD;  Location: [facility];  Service: General;  Laterality: Left;  ? TOTAL KNEE ARTHROPLASTY Left 03/13/2014  ? Procedure: LEFT TOTAL KNEE ARTHROPLASTY;  Surgeon: Kerin Salen, MD;  Location: [place];  Service: Orthopedics;  Laterality: Left;  ? TOTAL MASTECTOMY Left 11/12/2016  ? Procedure: LEFT TOTAL MASTECTOMY;  Surgeon: Excell Seltzer, MD;  Location: [facility];  Service: General;  Laterality: Left;  ? ? ?Family History  ?Problem Relation Age of Onset  ? Heart disease Mother   ? Emphysema Father   ? Cancer Sister   ?     ovarian  ? ? ? ? ?Controlled substance contract: 07/2821- drug screen done today ? ? ? ? ?Review of Systems  ?Constitutional:  Negative for diaphoresis.  ?Eyes:  Negative for pain.  ?Respiratory:  Negative for shortness of breath.   ?Cardiovascular:  Negative for chest pain, palpitations and leg swelling.  ?Gastrointestinal:  Negative for abdominal pain.  ?Endocrine: Negative for polydipsia.  ?Skin:  Negative for rash.  ?Neurological:  Negative for dizziness,  weakness and headaches.  ?Hematological:  Does not bruise/bleed easily.  ?All other systems reviewed and are negative. ? ?   ?Objective:  ? Physical Exam ?Vitals and nursing note reviewed.  ?Constitutional:   ?   General: She is not in acute distress. ?   Appearance: Normal appearance. She is well-developed.  ?HENT:  ?   Head: Normocephalic.  ?   Right Ear: Tympanic membrane normal. There is impacted cerumen.  ?   Left Ear: Tympanic membrane normal. There is impacted cerumen.  ?   Nose:  Nose normal.  ?   Mouth/Throat:  ?   Mouth: Mucous membranes are moist.  ?Eyes:  ?   Pupils: Pupils are equal, round, and reactive to light.  ?Neck:  ?   Vascular: No carotid bruit or JVD.  ?Cardiovascular:  ?   Rate and Rhythm: Normal rate and regular rhythm.  ?   Heart sounds: Normal heart sounds.  ?Pulmonary:  ?   Effort: Pulmonary effort is normal. No respiratory distress.  ?   Breath sounds: Normal breath sounds. No wheezing or rales.  ?Chest:  ?   Chest wall: No tenderness.  ?Abdominal:  ?   General: Bowel sounds are normal. There is no distension or abdominal bruit.  ?   Palpations: Abdomen is soft. There is no hepatomegaly, splenomegaly, mass or pulsatile mass.  ?   Tenderness: There is no abdominal tenderness.  ?Musculoskeletal:     ?   General: Normal range of motion.  ?   Cervical back: Normal range of motion and neck supple.  ?Lymphadenopathy:  ?   Cervical: No cervical adenopathy.  ?Skin: ?   General: Skin is warm and dry.  ?Neurological:  ?   Mental Status: She is alert and oriented to person, place, and time.  ?   Deep Tendon Reflexes: Reflexes are normal and symmetric.  ?Psychiatric:     ?   Behavior: Behavior normal.     ?   Thought Content: Thought content normal.     ?   Judgment: Judgment normal.  ? ? ?BP (!) 155/69   Pulse (!) 47   Temp 98.6 ?F (37 ?C) (Temporal)   Resp 20   Ht '5\' 3"'$  (1.6 m)   Wt 167 lb (75.8 kg)   SpO2 100%   BMI 29.58 kg/m?  ? ?UA (+) nitrites ? ?S/P bil irrigation ears- TM's normal ?    ?Assessment & Plan:  ? ?Darlene Gregory comes in today with chief complaint of Medical Management of Chronic Issues (Having floaters all the time/) ? ? ?Diagnosis and orders addressed: ? ?1. Dark urine ?- [place]

## 2021-08-04 NOTE — Patient Instructions (Signed)
Earwax Buildup, Adult ?The ears produce a substance called earwax that helps keep bacteria out of the ear and protects the skin in the ear canal. Occasionally, earwax can build up in the ear and cause discomfort or hearing loss. ?What are the causes? ?This condition is caused by a buildup of earwax. Ear canals are self-cleaning. Ear wax is made in the outer part of the ear canal and generally falls out in small amounts over time. ?When the self-cleaning mechanism is not working, earwax builds up and can cause decreased hearing and discomfort. Attempting to clean ears with cotton swabs can push the earwax deep into the ear canal and cause decreased hearing and pain. ?What increases the risk? ?This condition is more likely to develop in people who: ?Clean their ears often with cotton swabs. ?Pick at their ears. ?Use earplugs or in-ear headphones often, or wear hearing aids. ?The following factors may also make you more likely to develop this condition: ?Being female. ?Being of older age. ?Naturally producing more earwax. ?Having narrow ear canals. ?Having earwax that is overly thick or sticky. ?Having excess hair in the ear canal. ?Having eczema. ?Being dehydrated. ?What are the signs or symptoms? ?Symptoms of this condition include: ?Reduced or muffled hearing. ?A feeling of fullness in the ear or feeling that the ear is plugged. ?Fluid coming from the ear. ?Ear pain or an itchy ear. ?Ringing in the ear. ?Coughing. ?Balance problems. ?An obvious piece of earwax that can be seen inside the ear canal. ?How is this diagnosed? ?This condition may be diagnosed based on: ?Your symptoms. ?Your medical history. ?An ear exam. During the exam, your health care provider will look into your ear with an instrument called an otoscope. ?You may have tests, including a hearing test. ?How is this treated? ?This condition may be treated by: ?Using ear drops to soften the earwax. ?Having the earwax removed by a health care provider. The  health care provider may: ?Flush the ear with water. ?Use an instrument that has a loop on the end (curette). ?Use a suction device. ?Having surgery to remove the wax buildup. This may be done in severe cases. ?Follow these instructions at home: ? ?Take over-the-counter and prescription medicines only as told by your health care provider. ?Do not put any objects, including cotton swabs, into your ear. You can clean the opening of your ear canal with a washcloth or facial tissue. ?Follow instructions from your health care provider about cleaning your ears. Do not overclean your ears. ?Drink enough fluid to keep your urine pale yellow. This will help to thin the earwax. ?Keep all follow-up visits as told. If earwax builds up in your ears often or if you use hearing aids, consider seeing your health care provider for routine, preventive ear cleanings. Ask your health care provider how often you should schedule your cleanings. ?If you have hearing aids, clean them according to instructions from the manufacturer and your health care provider. ?Contact a health care provider if: ?You have ear pain. ?You develop a fever. ?You have pus or other fluid coming from your ear. ?You have hearing loss. ?You have ringing in your ears that does not go away. ?You feel like the room is spinning (vertigo). ?Your symptoms do not improve with treatment. ?Get help right away if: ?You have bleeding from the affected ear. ?You have severe ear pain. ?Summary ?Earwax can build up in the ear and cause discomfort or hearing loss. ?The most common symptoms of this condition include   reduced or muffled hearing, a feeling of fullness in the ear, or feeling that the ear is plugged. ?This condition may be diagnosed based on your symptoms, your medical history, and an ear exam. ?This condition may be treated by using ear drops to soften the earwax or by having the earwax removed by a health care provider. ?Do not put any objects, including cotton  swabs, into your ear. You can clean the opening of your ear canal with a washcloth or facial tissue. ?This information is not intended to replace advice given to you by your health care provider. Make sure you discuss any questions you have with your health care provider. ?Document Revised: 08/14/2019 Document Reviewed: 08/14/2019 ?Elsevier Patient Education ? 2022 [employer] ? ?

## 2021-08-05 LAB — CMP14+EGFR
ALT: 11 IU/L (ref 0–32)
AST: 12 IU/L (ref 0–40)
Albumin/Globulin Ratio: 1.7 (ref 1.2–2.2)
Albumin: 4 g/dL (ref 3.6–4.6)
Alkaline Phosphatase: 89 IU/L (ref 44–121)
BUN/Creatinine Ratio: 18 (ref 12–28)
BUN: 32 mg/dL — ABNORMAL HIGH (ref 8–27)
Bilirubin Total: 0.3 mg/dL (ref 0.0–1.2)
CO2: 24 mmol/L (ref 20–29)
Calcium: 9.6 mg/dL (ref 8.7–10.3)
Chloride: 102 mmol/L (ref 96–106)
Creatinine, Ser: 1.79 mg/dL — ABNORMAL HIGH (ref 0.57–1.00)
Globulin, Total: 2.4 g/dL (ref 1.5–4.5)
Glucose: 115 mg/dL — ABNORMAL HIGH (ref 70–99)
Potassium: 4.4 mmol/L (ref 3.5–5.2)
Sodium: 145 mmol/L — ABNORMAL HIGH (ref 134–144)
Total Protein: 6.4 g/dL (ref 6.0–8.5)
eGFR: 27 mL/min/{1.73_m2} — ABNORMAL LOW (ref >59)

## 2021-08-05 LAB — THYROID PANEL WITH TSH
Free Thyroxine Index: 3.4 (ref 1.2–4.9)
T3 Uptake Ratio: 31 % (ref 24–39)
T4, Total: 10.9 ug/dL (ref 4.5–12.0)
TSH: 3.94 u[IU]/mL (ref 0.450–4.500)

## 2021-08-05 LAB — LIPID PANEL
Chol/HDL Ratio: 2.1 ratio (ref 0.0–4.4)
Cholesterol, Total: 139 mg/dL (ref 100–199)
HDL: 65 mg/dL (ref >39)
LDL Chol Calc (NIH): 60 mg/dL (ref 0–99)
Triglycerides: 73 mg/dL (ref 0–149)
VLDL Cholesterol Cal: 14 mg/dL (ref 5–40)

## 2021-08-05 LAB — CBC WITH DIFFERENTIAL/PLATELET
Basophils Absolute: 0.1 10*3/uL (ref 0.0–0.2)
Basos: 1 %
EOS (ABSOLUTE): 0.2 10*3/uL (ref 0.0–0.4)
Eos: 2 %
Hematocrit: 37 % (ref 34.0–46.6)
Hemoglobin: 12.2 g/dL (ref 11.1–15.9)
Immature Grans (Abs): 0 10*3/uL (ref 0.0–0.1)
Immature Granulocytes: 0 %
Lymphocytes Absolute: 1.8 10*3/uL (ref 0.7–3.1)
Lymphs: 21 %
MCH: 32.4 pg (ref 26.6–33.0)
MCHC: 33 g/dL (ref 31.5–35.7)
MCV: 98 fL — ABNORMAL HIGH (ref 79–97)
Monocytes Absolute: 0.6 10*3/uL (ref 0.1–0.9)
Monocytes: 7 %
Neutrophils Absolute: 6.2 10*3/uL (ref 1.4–7.0)
Neutrophils: 69 %
Platelets: 163 10*3/uL (ref 150–450)
RBC: 3.76 x10E6/uL — ABNORMAL LOW (ref 3.77–5.28)
RDW: 12.1 % (ref 11.7–15.4)
WBC: 8.9 10*3/uL (ref 3.4–10.8)

## 2021-08-06 ENCOUNTER — Telehealth: Payer: Self-pay | Admitting: Nurse Practitioner

## 2021-08-06 NOTE — Telephone Encounter (Signed)
Contacted patient and gave lab results. Also mailed copy per patients request ?

## 2021-08-08 LAB — URINE CULTURE

## 2021-08-09 LAB — TOXASSURE SELECT 13 (MW), URINE

## 2021-08-10 ENCOUNTER — Telehealth: Payer: Self-pay | Admitting: Nurse Practitioner

## 2021-08-10 DIAGNOSIS — F411 Generalized anxiety disorder: Secondary | ICD-10-CM

## 2021-08-10 MED ORDER — ALPRAZOLAM 0.25 MG PO TABS: 0.2500 mg | ORAL_TABLET | Freq: Two times a day (BID) | ORAL | 5 refills | Status: DC | PRN

## 2021-08-10 NOTE — Telephone Encounter (Signed)
Xanax rx sent o [facility] ?

## 2021-08-27 ENCOUNTER — Telehealth: Payer: Self-pay | Admitting: Nurse Practitioner

## 2021-08-27 DIAGNOSIS — F411 Generalized anxiety disorder: Secondary | ICD-10-CM

## 2021-08-27 MED ORDER — ALPRAZOLAM 0.25 MG PO TABS: 0.2500 mg | ORAL_TABLET | Freq: Two times a day (BID) | ORAL | 5 refills | Status: DC | PRN

## 2021-08-27 NOTE — Telephone Encounter (Signed)
Patient aware and verbalized understanding. °

## 2021-08-27 NOTE — Telephone Encounter (Signed)
Pt is asking that her rx for ALPRAZolam Darlene Gregory) 0.25 MG tablet be sent to [facility] in [place], because it is too much trouble to go to [facility] for this rx. She has it sent to [facility] because [facility] was out at the time. Please call back when done. ?

## 2021-08-27 NOTE — Telephone Encounter (Signed)
Xanax prescription sent to [facility] in [place] ?

## 2021-09-03 DIAGNOSIS — H40033 Anatomical narrow angle, bilateral: Secondary | ICD-10-CM | POA: Diagnosis not present

## 2021-09-03 DIAGNOSIS — H2513 Age-related nuclear cataract, bilateral: Secondary | ICD-10-CM | POA: Diagnosis not present

## 2021-09-04 ENCOUNTER — Ambulatory Visit: Payer: Medicare PPO | Admitting: Nurse Practitioner

## 2021-09-21 ENCOUNTER — Telehealth: Payer: Self-pay | Admitting: Nurse Practitioner

## 2021-09-21 DIAGNOSIS — I10 Essential (primary) hypertension: Secondary | ICD-10-CM

## 2021-09-21 DIAGNOSIS — F411 Generalized anxiety disorder: Secondary | ICD-10-CM

## 2021-09-21 MED ORDER — ALPRAZOLAM 0.25 MG PO TABS: 0.2500 mg | ORAL_TABLET | Freq: Two times a day (BID) | ORAL | 5 refills | Status: DC | PRN

## 2021-09-21 NOTE — Telephone Encounter (Signed)
Called and notified [name] ?

## 2021-09-21 NOTE — Telephone Encounter (Signed)
Pt and pts son called stating that Xanax Rx needs to be corrected and resent to the pharmacy because currently she is only allowed to get 30 tablets each month but she is supposed to take 2 daily which is only lasting her 15 days. ? ?Pt still uses [facility] in [place].  ?

## 2021-09-21 NOTE — Telephone Encounter (Signed)
Prescription corrected

## 2021-10-13 ENCOUNTER — Telehealth: Payer: Self-pay | Admitting: Nurse Practitioner

## 2021-10-15 ENCOUNTER — Other Ambulatory Visit: Payer: Self-pay

## 2021-10-15 DIAGNOSIS — Z1231 Encounter for screening mammogram for malignant neoplasm of breast: Secondary | ICD-10-CM

## 2021-10-15 NOTE — Telephone Encounter (Signed)
Please place mammogram order.

## 2021-12-01 ENCOUNTER — Inpatient Hospital Stay: Payer: Medicare PPO | Attending: Hematology and Oncology | Admitting: Hematology and Oncology

## 2021-12-01 ENCOUNTER — Encounter: Payer: Self-pay | Admitting: Hematology and Oncology

## 2021-12-01 ENCOUNTER — Other Ambulatory Visit: Payer: Self-pay

## 2021-12-01 VITALS — BP 165/64 | HR 55 | Temp 98.1°F | Resp 17 | Ht 63.0 in | Wt 162.5 lb

## 2021-12-01 DIAGNOSIS — Z9012 Acquired absence of left breast and nipple: Secondary | ICD-10-CM | POA: Diagnosis not present

## 2021-12-01 DIAGNOSIS — E039 Hypothyroidism, unspecified: Secondary | ICD-10-CM | POA: Insufficient documentation

## 2021-12-01 DIAGNOSIS — Z79810 Long term (current) use of selective estrogen receptor modulators (SERMs): Secondary | ICD-10-CM | POA: Insufficient documentation

## 2021-12-01 DIAGNOSIS — I10 Essential (primary) hypertension: Secondary | ICD-10-CM | POA: Diagnosis not present

## 2021-12-01 DIAGNOSIS — Z79899 Other long term (current) drug therapy: Secondary | ICD-10-CM | POA: Diagnosis not present

## 2021-12-01 DIAGNOSIS — K219 Gastro-esophageal reflux disease without esophagitis: Secondary | ICD-10-CM | POA: Insufficient documentation

## 2021-12-01 DIAGNOSIS — Z87891 Personal history of nicotine dependence: Secondary | ICD-10-CM | POA: Insufficient documentation

## 2021-12-01 DIAGNOSIS — Z8041 Family history of malignant neoplasm of ovary: Secondary | ICD-10-CM | POA: Diagnosis not present

## 2021-12-01 DIAGNOSIS — C50412 Malignant neoplasm of upper-outer quadrant of left female breast: Secondary | ICD-10-CM | POA: Diagnosis not present

## 2021-12-01 DIAGNOSIS — Z836 Family history of other diseases of the respiratory system: Secondary | ICD-10-CM | POA: Diagnosis not present

## 2021-12-01 DIAGNOSIS — Z90722 Acquired absence of ovaries, bilateral: Secondary | ICD-10-CM | POA: Insufficient documentation

## 2021-12-01 DIAGNOSIS — Z170 Estrogen receptor positive status [ER+]: Secondary | ICD-10-CM | POA: Diagnosis not present

## 2021-12-01 DIAGNOSIS — Z882 Allergy status to sulfonamides status: Secondary | ICD-10-CM | POA: Insufficient documentation

## 2021-12-01 DIAGNOSIS — Z8249 Family history of ischemic heart disease and other diseases of the circulatory system: Secondary | ICD-10-CM | POA: Insufficient documentation

## 2021-12-01 NOTE — Progress Notes (Signed)
[facility]  [phone] Fax:([phone]) [phone]     ID: Darlene Gregory DOB: 03/24/1934  MR#: [id number]  CSN#:[id number]  Patient Care Team: Chevis Pretty, [place] as PCP - General (Nurse Practitioner) Excell Seltzer, MD (Inactive) as Consulting Physician (General Surgery) Magrinat, Virgie Dad, MD (Inactive) as Consulting Physician (Oncology) Eppie Gibson, MD as Attending Physician (Radiation Oncology) Arvella Nigh, MD as Consulting Physician (Obstetrics and Gynecology) Steffanie Rainwater, DPM as Consulting Physician (Podiatry) OTHER MD:  CHIEF COMPLAINT: Estrogen receptor positive breast cancer (s/p left mastectomy)  CURRENT TREATMENT: Tamoxifen   INTERVAL HISTORY: Darlene Gregory returns today for follow-up of her estrogen receptor positive breast cancer.  She is accompanied by her son Darlene Gregory She tells me she ran out of tamoxifen about 3 or 4 days ago and since she completed 5 years of antiestrogen therapy, she did not refill it. She denies any new health complaints today.  She was in a wheelchair, has some baseline lower extremity swelling which has not changed.  She recently had mammogram in June 2023 which did not show any evidence of malignancy.  Patient denies any breast changes herself.    COVID 19 VACCINATION STATUS: Moderna x3   BREAST CANCER HISTORY: From the original intake note:   Darlene Gregory had screening mammography April 2018 showing a possible mass in the left breast. She was referred to the [facility] where she underwent left mammography and ultrasonography on 09/10/2016. This showed the breast density to be category C. In the upper outer quadrant of the breast there was an area of architectural distortion which was not palpable. Ultrasonography confirmed a 0.9 cm hypoechoic mass at the 1:30 o'clock position of the left breast 3 cm from the nipple. Ultrasound of the left axilla showed a suspicious lymph node.  On 09/24/2016 the patient underwent biopsy of  the left breast mass and the left axillary lymph node in question. The lymph node was benign, and this was read as concordant by radiology. The left breast mass however showed invasive ductal carcinoma, grade 2, estrogen receptor 100% positive, progesterone receptor 95% positive, both with strong staining intensity, with an MIB-1 of 5%, and no HER-2 amplification, the signals ratio being 1.46 and the number per cell 1.90.  The patient's subsequent history is as detailed below   PAST MEDICAL HISTORY: Past Medical History:  Diagnosis Date   Cancer ([facility]) 10/2016   left breast cancer   Difficult intubation    "small trachea"; 01/18/05: glidescope but unable to pass stylet -->fast track LMA with blind passage for ETT, consider awake intubation; 08/14/08: IV induction and DL with glidesecope for ETT   DJD (degenerative joint disease)    Frequent urination    GERD (gastroesophageal reflux disease)    Hypertension    Hypothyroidism    Osteoporosis    PONV (postoperative nausea and vomiting)    Thyroid disease     PAST SURGICAL HISTORY: Past Surgical History:  Procedure Laterality Date   ABDOMINAL HYSTERECTOMY     BREAST LUMPECTOMY WITH RADIOACTIVE SEED LOCALIZATION Left 10/29/2016   Procedure: LEFT BREAST LUMPECTOMY WITH RADIOACTIVE SEED LOCALIZATION;  Surgeon: Excell Seltzer, MD;  Location: [facility];  Service: General;  Laterality: Left;   CAROTID BODY TUMOR EXCISION     COLONOSCOPY W/ POLYPECTOMY     EYE SURGERY Bilateral    cataract removed   JOINT REPLACEMENT Right 2006   knee   MASTECTOMY Left 2018   PAROTIDECTOMY Left    RE-EXCISION OF BREAST LUMPECTOMY Left  11/05/2016   Procedure: RE-EXCISION OF LEFT BREAST LUMPECTOMY;  Surgeon: Excell Seltzer, MD;  Location: [facility];  Service: General;  Laterality: Left;   TOTAL KNEE ARTHROPLASTY Left 03/13/2014   Procedure: LEFT TOTAL KNEE ARTHROPLASTY;  Surgeon: Kerin Salen, MD;  Location: [facility];   Service: Orthopedics;  Laterality: Left;   TOTAL MASTECTOMY Left 11/12/2016   Procedure: LEFT TOTAL MASTECTOMY;  Surgeon: Excell Seltzer, MD;  Location: [facility];  Service: General;  Laterality: Left;    FAMILY HISTORY Family History  Problem Relation Age of Onset   Heart disease Mother    Emphysema Father    Cancer Sister        ovarian  The patient's father died at the age of 34 from emphysema. The patient's mother died from "old age" at 66. The patient had one brother, 3 sisters. There is no history of breast cancer in the family. One sister had "a female cancer", but the patient is not sure what it may have been.   GYNECOLOGIC HISTORY:  No LMP recorded. Patient has had a hysterectomy. The patient is status post total abdominal hysterectomy with bilateral salpingo-oophorectomy remotely. She is GX P2, first live birth age 89. She never used oral contraceptives.   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 Her husband Darlene Gregory has been a Psychologist, sport and exercise.  He died 03-12-21.  They currently have 41 acres. Son Darlene Gregory, 56, lives in [place] and worked for [employer].  He is now retired.  Son Darlene Gregory lives in [place] and works for [employer]. The patient has 2 grandchildren and some great-grandchildren. She is a Psychologist, forensic.    ADVANCED DIRECTIVES: In place   HEALTH MAINTENANCE: Social History   Tobacco Use   Smoking status: Former   Smokeless tobacco: Never  Scientific laboratory technician Use: Never used  Substance Use Topics   Alcohol use: No   Drug use: No     Colonoscopy: Repeat due 2019/[facility]  PAP:  Bone density: 04/20/2013, T score -2.3   Allergies  Allergen Reactions   Hyoscyamine Sulfate     REACTION: rash   Norvasc [Amlodipine Besylate] Swelling   Pyridium  [Phenazopyridine Hcl]    Phenazopyridine Hcl Rash   Sulfonamide Derivatives Rash    Current Outpatient Medications  Medication Sig Dispense Refill    ALPRAZolam (XANAX) 0.25 MG tablet Take 1 tablet (0.25 mg total) by mouth 2 (two) times daily as needed for anxiety. 60 tablet 5   aspirin EC 81 MG tablet Take 1 tablet (81 mg total) by mouth daily. 100 tablet 4   busPIRone (BUSPAR) 5 MG tablet Take 1 tablet (5 mg total) by mouth daily as needed. 30 tablet 5   cephALEXin (KEFLEX) 500 MG capsule Take 1 capsule (500 mg total) by mouth 2 (two) times daily. 14 capsule 0   cloNIDine (CATAPRES) 0.2 MG tablet Take 1 tablet (0.2 mg total) by mouth 2 (two) times daily. 180 tablet 1   Ferrous Sulfate (SLOW FE) 142 (45 Fe) MG TBCR Take 1 tablet by mouth daily. 90 tablet 1   FIBER PO Take 2 each by mouth daily.      fish oil-omega-3 fatty acids 1000 MG capsule Take 1 g by mouth daily.     furosemide (LASIX) 20 MG tablet Take 1 tablet (20 mg total) by mouth daily. 90 tablet 1   levothyroxine (EUTHYROX) 88 MCG tablet Take 1 tablet (88  mcg total) by mouth daily. 90 tablet 1   Multiple Vitamins-Minerals (PRESERVISION AREDS PO) Take by mouth.     olmesartan (BENICAR) 40 MG tablet Take 1 tablet (40 mg total) by mouth daily. 90 tablet 1   omeprazole (PRILOSEC) 20 MG capsule Take 1 capsule (20 mg total) by mouth daily. 90 capsule 1   tamoxifen (NOLVADEX) 20 MG tablet Take 1 tablet by mouth once daily 90 tablet 0   vitamin C (ASCORBIC ACID) 500 MG tablet Take 500 mg by mouth daily.     No current facility-administered medications for this visit.    OBJECTIVE: White woman who had great difficulty climbing to the examination table Vitals:   12/01/21 1148  BP: (!) 165/64  Pulse: (!) 55  Resp: 17  Temp: 98.1 F (36.7 C)  SpO2: 100%      Body mass index is 28.79 kg/m.    ECOG FS:1 - Symptomatic but completely ambulatory  Physical Exam Constitutional:      Appearance: Normal appearance.  Chest:     Comments: Right breast normal to inspection and palpation, left breast status postmastectomy Musculoskeletal:     Cervical back: Normal range of motion and neck  supple. No rigidity.  Lymphadenopathy:     Cervical: No cervical adenopathy.  Skin:    General: Skin is warm and dry.  Neurological:     General: No focal deficit present.     Mental Status: She is alert.       LAB RESULTS:  CMP     Component Value Date/Time   NA 145 (H) 08/04/2021 1212   NA 141 03/14/2017 1056   K 4.4 08/04/2021 1212   K 4.6 03/14/2017 1056   CL 102 08/04/2021 1212   CO2 24 08/04/2021 1212   CO2 28 03/14/2017 1056   GLUCOSE 115 (H) 08/04/2021 1212   GLUCOSE 99 09/26/2017 0938   GLUCOSE 90 03/14/2017 1056   BUN 32 (H) 08/04/2021 1212   BUN 29.9 (H) 03/14/2017 1056   CREATININE 1.79 (H) 08/04/2021 1212   CREATININE 1.83 (H) 09/26/2017 0938   CREATININE 1.8 (H) 03/14/2017 1056   CALCIUM 9.6 08/04/2021 1212   CALCIUM 9.4 03/14/2017 1056   PROT 6.4 08/04/2021 1212   PROT 6.6 03/14/2017 1056   ALBUMIN 4.0 08/04/2021 1212   ALBUMIN 3.4 (L) 03/14/2017 1056   AST 12 08/04/2021 1212   AST 15 09/26/2017 0938   AST 14 03/14/2017 1056   ALT 11 08/04/2021 1212   ALT 15 09/26/2017 0938   ALT 11 03/14/2017 1056   ALKPHOS 89 08/04/2021 1212   ALKPHOS 69 03/14/2017 1056   BILITOT 0.3 08/04/2021 1212   BILITOT 0.3 09/26/2017 0938   BILITOT 0.38 03/14/2017 1056   GFRNONAA 29 (L) 02/26/2020 0850   GFRNONAA 25 (L) 09/26/2017 0938   GFRNONAA 49 (L) 11/24/2012 0823   GFRAA 33 (L) 02/26/2020 0850   GFRAA 28 (L) 09/26/2017 0938   GFRAA 56 (L) 11/24/2012 0823    No results found for: "TOTALPROTELP", "ALBUMINELP", "A1GS", "A2GS", "BETS", "BETA2SER", "GAMS", "MSPIKE", "SPEI"  No results found for: "KPAFRELGTCHN", "LAMBDASER", "KAPLAMBRATIO"  Lab Results  Component Value Date   WBC 8.9 08/04/2021   NEUTROABS 6.2 08/04/2021   HGB 12.2 08/04/2021   HCT 37.0 08/04/2021   MCV 98 (H) 08/04/2021   PLT 163 08/04/2021      Chemistry      Component Value Date/Time   NA 145 (H) 08/04/2021 1212   NA 141 03/14/2017 1056  K 4.4 08/04/2021 1212   K 4.6 03/14/2017  1056   CL 102 08/04/2021 1212   CO2 24 08/04/2021 1212   CO2 28 03/14/2017 1056   BUN 32 (H) 08/04/2021 1212   BUN 29.9 (H) 03/14/2017 1056   CREATININE 1.79 (H) 08/04/2021 1212   CREATININE 1.83 (H) 09/26/2017 0938   CREATININE 1.8 (H) 03/14/2017 1056      Component Value Date/Time   CALCIUM 9.6 08/04/2021 1212   CALCIUM 9.4 03/14/2017 1056   ALKPHOS 89 08/04/2021 1212   ALKPHOS 69 03/14/2017 1056   AST 12 08/04/2021 1212   AST 15 09/26/2017 0938   AST 14 03/14/2017 1056   ALT 11 08/04/2021 1212   ALT 15 09/26/2017 0938   ALT 11 03/14/2017 1056   BILITOT 0.3 08/04/2021 1212   BILITOT 0.3 09/26/2017 0938   BILITOT 0.38 03/14/2017 1056       No results found for: "LABCA2"  No components found for: "HOOILN797"  No results for input(s): "INR" in the last 168 hours.  Urinalysis    Component Value Date/Time   APPEARANCEUR Clear 08/04/2021 1106   GLUCOSEU Negative 08/04/2021 1106   BILIRUBINUR Negative 08/04/2021 1106   PROTEINUR 1+ (A) 08/04/2021 1106   NITRITE Positive (A) 08/04/2021 1106   LEUKOCYTESUR Trace (A) 08/04/2021 1106    STUDIES: No results found.   ELIGIBLE FOR AVAILABLE RESEARCH PROTOCOL: no  ASSESSMENT: 88 y.o. [place], [place] woman status post left breast upper outer quadrant biopsy 09/24/2016 for a clinical T1B N0, stage IA invasive ductal carcinoma, grade 2, estrogen and progesterone receptor positive, HER-2 not amplified, with an MIB-1 of 5%  (1) Status post left lumpectomy 10/29/2016 for a pT1c pNX, stage IA invasive ductal carcinoma, grade 2, with positive margins  (a) additional surgery 11/05/2016 removed additional ductal carcinoma in situ, but margins were still positive  (b) left mastectomy with sentinel lymph node sampling 11/12/2016 showed pTis pN0, with negative margins  (2) adjuvant radiation not indicated  (3) tamoxifen started 11/30/2016.  (a) bone density December 2014 shows a T score of -2.3   PLAN: Patient is here for  follow-up.  She has now completed 5 years of adjuvant antiestrogen therapy, recently ran out of her prescription and did not refill it.  Physical examination today without any concerns. Mammogram from October 15, 2021 with no evidence of malignancy. I have discussed with her about the option of continuing with [place] on an annual basis versus follow-up with PCP.  She would like to follow-up with her PCP and do her annual mammogram there.  She was recommended to continue self breast exams and report any changes. With regards to baby aspirin, she mentions that this was started by Dr. Jana Hakim.  Since she is not going to be on tamoxifen anymore, she may not need baby aspirin for DVT prevention.  However I have encouraged her to talk to her PCP as well to see if aspirin can be continued for cardiovascular protection and she expressed understanding.  Total time spent: 30 minutes  *Total Encounter Time as defined by the Centers for Medicare and Medicaid Services includes, in addition to the face-to-face time of a patient visit (documented in the note above) non-face-to-face time: obtaining and reviewing outside history, ordering and reviewing medications, tests or procedures, care coordination (communications with other health care professionals or caregivers) and documentation in the medical record.

## 2021-12-28 ENCOUNTER — Ambulatory Visit (INDEPENDENT_AMBULATORY_CARE_PROVIDER_SITE_OTHER): Payer: Medicare PPO

## 2021-12-28 VITALS — Wt 162.0 lb

## 2021-12-28 DIAGNOSIS — Z0000 Encounter for general adult medical examination without abnormal findings: Secondary | ICD-10-CM

## 2021-12-28 NOTE — Progress Notes (Signed)
Subjective:   Darlene Gregory is a 88 y.o. female who presents for Medicare Annual (Subsequent) preventive examination.  Virtual Visit via Telephone Note  I connected with  Darlene Gregory on 12/28/21 at  8:15 AM EDT by telephone and verified that I am speaking with the correct person using two identifiers.  Location: Patient: Home Provider: [facility] Persons participating in the virtual visit: patient/Nurse Health Advisor   I discussed the limitations, risks, security and privacy concerns of performing an evaluation and management service by telephone and the availability of in person appointments. The patient expressed understanding and agreed to proceed.  Interactive audio and video telecommunications were attempted between this nurse and patient, however failed, due to patient having technical difficulties OR patient did not have access to video capability.  We continued and completed visit with audio only.  Some vital signs may be absent or patient reported.   Darlene Gregory E Bernardina Cacho, LPN   Review of Systems     Cardiac Risk Factors include: advanced age (>40mn, >>25women);sedentary lifestyle;hypertension     Objective:    Today's Vitals   12/28/21 0816  Weight: 162 lb (73.5 kg)   Body mass index is 28.7 kg/m.     12/28/2021    8:22 AM 07/27/2021   11:21 AM 12/25/2020    8:43 AM 12/24/2019    8:38 AM 12/13/2018    8:34 AM 10/17/2017    8:59 AM 11/12/2016    6:31 AM  Advanced Directives  Does Patient Have a Medical Advance Directive? Yes Yes Yes No No Yes Yes  Type of AParamedicof A[place]ackenkillLiving will Living will [place]Living will   [place]Living will HH[place]Living will  Does patient want to make changes to medical advance directive?      No - Patient declined No - Patient declined  Copy of HRegisterin Chart? No - copy requested  No - copy requested   No - copy requested No - copy  requested  Would patient like information on creating a medical advance directive?  No - Patient declined  No - Patient declined Yes (MAU/Ambulatory/Procedural Areas - Information given)  No - Patient declined    Current Medications (verified) Outpatient Encounter Medications as of 12/28/2021  Medication Sig   ALPRAZolam (XANAX) 0.25 MG tablet Take 1 tablet (0.25 mg total) by mouth 2 (two) times daily as needed for anxiety.   busPIRone (BUSPAR) 5 MG tablet Take 1 tablet (5 mg total) by mouth daily as needed.   cephALEXin (KEFLEX) 500 MG capsule Take 1 capsule (500 mg total) by mouth 2 (two) times daily.   cloNIDine (CATAPRES) 0.2 MG tablet Take 1 tablet (0.2 mg total) by mouth 2 (two) times daily.   Ferrous Sulfate (SLOW FE) 142 (45 Fe) MG TBCR Take 1 tablet by mouth daily.   FIBER PO Take 2 each by mouth daily.    fish oil-omega-3 fatty acids 1000 MG capsule Take 1 g by mouth daily.   furosemide (LASIX) 20 MG tablet Take 1 tablet (20 mg total) by mouth daily.   levothyroxine (EUTHYROX) 88 MCG tablet Take 1 tablet (88 mcg total) by mouth daily.   Multiple Vitamins-Minerals (PRESERVISION AREDS PO) Take by mouth.   olmesartan (BENICAR) 40 MG tablet Take 1 tablet (40 mg total) by mouth daily.   omeprazole (PRILOSEC) 20 MG capsule Take 1 capsule (20 mg total) by mouth daily.   vitamin C (  ASCORBIC ACID) 500 MG tablet Take 500 mg by mouth daily.   [DISCONTINUED] aspirin EC 81 MG tablet Take 1 tablet (81 mg total) by mouth daily.   [DISCONTINUED] tamoxifen (NOLVADEX) 20 MG tablet Take 1 tablet by mouth once daily   No facility-administered encounter medications on file as of 12/28/2021.    Allergies (verified) Hyoscyamine sulfate, Norvasc [amlodipine besylate], Pyridium  [phenazopyridine hcl], Phenazopyridine hcl, and Sulfonamide derivatives   History: Past Medical History:  Diagnosis Date   Cancer ([place]) 10/2016   left breast cancer   Difficult intubation    "small trachea"; 01/18/05:  glidescope but unable to pass stylet -->fast track LMA with blind passage for ETT, consider awake intubation; 08/14/08: IV induction and DL with glidesecope for ETT   DJD (degenerative joint disease)    Frequent urination    GERD (gastroesophageal reflux disease)    Hypertension    Hypothyroidism    Osteoporosis    PONV (postoperative nausea and vomiting)    Thyroid disease    Past Surgical History:  Procedure Laterality Date   ABDOMINAL HYSTERECTOMY     BREAST LUMPECTOMY WITH RADIOACTIVE SEED LOCALIZATION Left 10/29/2016   Procedure: LEFT BREAST LUMPECTOMY WITH RADIOACTIVE SEED LOCALIZATION;  Surgeon: Excell Seltzer, MD;  Location: [place];  Service: General;  Laterality: Left;   CAROTID BODY TUMOR EXCISION     COLONOSCOPY W/ POLYPECTOMY     EYE SURGERY Bilateral    cataract removed   JOINT REPLACEMENT Right 2006   knee   MASTECTOMY Left 2018   PAROTIDECTOMY Left    RE-EXCISION OF BREAST LUMPECTOMY Left 11/05/2016   Procedure: RE-EXCISION OF LEFT BREAST LUMPECTOMY;  Surgeon: Excell Seltzer, MD;  Location: [place];  Service: General;  Laterality: Left;   TOTAL KNEE ARTHROPLASTY Left 03/13/2014   Procedure: LEFT TOTAL KNEE ARTHROPLASTY;  Surgeon: Kerin Salen, MD;  Location: [place];  Service: Orthopedics;  Laterality: Left;   TOTAL MASTECTOMY Left 11/12/2016   Procedure: LEFT TOTAL MASTECTOMY;  Surgeon: Excell Seltzer, MD;  Location: [place];  Service: General;  Laterality: Left;   Family History  Problem Relation Age of Onset   Heart disease Mother    Emphysema Father    Cancer Sister        ovarian   Social History   Socioeconomic History   Marital status: Widowed    Spouse name: [name]   Number of children: 2   Years of education: 12   Highest education level: 12th grade  Occupational History   Occupation: Optometrist    Comment: Retired  Tobacco Use   Smoking status: Former   Smokeless tobacco:  Never  Scientific laboratory technician Use: Never used  Substance and Sexual Activity   Alcohol use: No   Drug use: No   Sexual activity: Not Currently  Other Topics Concern   Not on file  Social History Narrative   2 sons, live in [place] another at the [place]   Her husband passed 2023   Social Determinants of Health   Financial Resource Strain: Low Risk  (12/28/2021)   Overall Financial Resource Strain (CARDIA)    Difficulty of Paying Living Expenses: Not hard at all  Food Insecurity: No Food Insecurity (12/28/2021)   Hunger Vital Sign    Worried About Running Out of Food in the Last Year: Never true    [place] in the Last Year: Never true  Transportation Needs: No Transportation Needs (12/28/2021)  PRAPARE - Hydrologist (Medical): No    Lack of Transportation (Non-Medical): No  Physical Activity: Insufficiently Active (12/28/2021)   Exercise Vital Sign    Days of Exercise per Week: 7 days    Minutes of Exercise per Session: 20 min  Stress: No Stress Concern Present (12/28/2021)   [place]    Feeling of Stress : Only a little  Social Connections: Moderately Isolated (12/28/2021)   Social Connection and Isolation Panel [NHANES]    Frequency of Communication with Friends and Family: More than three times a week    Frequency of Social Gatherings with Friends and Family: More than three times a week    Attends Religious Services: Never    Marine scientist or Organizations: Yes    Attends Music therapist: More than 4 times per year    Marital Status: Widowed    Tobacco Counseling Counseling given: Not Answered   Clinical Intake:  Pre-visit preparation completed: Yes  Pain : No/denies pain     BMI - recorded: 28.7 Nutritional Status: BMI 25 -29 Overweight Nutritional Risks: None Diabetes: No  How often do you need to have someone help you when you read  instructions, pamphlets, or other written materials from your doctor or pharmacy?: 1 - Never  Diabetic? no  Interpreter Needed?: No  Information entered by :: Advith Martine, LPN   Activities of Daily Living    12/28/2021    8:22 AM  In your present state of health, do you have any difficulty performing the following activities:  Hearing? 0  Vision? 0  Difficulty concentrating or making decisions? 0  Walking or climbing stairs? 1  Dressing or bathing? 0  Doing errands, shopping? 1  Comment has floaters, can't see well - children drive her  Preparing Food and eating ? N  Using the Toilet? N  In the past six months, have you accidently leaked urine? N  Do you have problems with loss of bowel control? N  Managing your Medications? N  Managing your Finances? N  Housekeeping or managing your Housekeeping? N    Patient Care Team: Chevis Pretty, FNP as PCP - General (Nurse Practitioner) Excell Seltzer, MD (Inactive) as Consulting Physician (General Surgery) Magrinat, Virgie Dad, MD (Inactive) as Consulting Physician (Oncology) Eppie Gibson, MD as Attending Physician (Radiation Oncology) Arvella Nigh, MD as Consulting Physician (Obstetrics and Gynecology) Steffanie Rainwater, DPM as Consulting Physician (Podiatry)  Indicate any recent Medical Services you may have received from other than [facility] providers in the past year (date may be approximate).     Assessment:   This is a routine wellness examination for Haya.  Hearing/Vision screen Hearing Screening - Comments:: Denies hearing difficulties   Vision Screening - Comments:: Wears rx glasses - up to date with routine eye exams with [facility] in [place] issues and exercise activities discussed: Current Exercise Habits: Home exercise routine, Type of exercise: walking;stretching, Time (Minutes): 20, Frequency (Times/Week): 7, Weekly Exercise (Minutes/Week): 140, Intensity: Mild, Exercise limited by: orthopedic  condition(s)   Goals Addressed             This Visit's Progress    Exercise 150 min/wk Moderate Activity   On track    Prevent falls   On track      Depression Screen    12/28/2021    8:20 AM 08/04/2021   11:05 AM 12/25/2020    8:31  AM 08/27/2020    8:46 AM 02/26/2020    8:06 AM 12/24/2019    8:31 AM 08/23/2019    8:19 AM  PHQ 2/9 Scores  PHQ - 2 Score 0 0 0 0 0 0 0  PHQ- 9 Score  0         Fall Risk    12/28/2021    8:17 AM 08/04/2021   11:05 AM 03/02/2021    8:36 AM 12/25/2020    8:43 AM 08/27/2020    8:46 AM  Fall Risk   Falls in the past year? 0 0 0 0 0  Number falls in past yr: 0   0   Injury with Fall? 0   0   Risk for fall due to : Orthopedic patient   Impaired balance/gait;Impaired vision   Risk for fall due to: Comment    She doesn't use a cane, but she holds on to whoever is assisting her when she goes out - cannot see well and has poor balance   Follow up Falls prevention discussed   Falls prevention discussed;Education provided     FALL RISK PREVENTION PERTAINING TO THE HOME:  Any stairs in or around the home? Yes  If so, are there any without handrails? No  Home free of loose throw rugs in walkways, pet beds, electrical cords, etc? Yes  Adequate lighting in your home to reduce risk of falls? Yes   ASSISTIVE DEVICES UTILIZED TO PREVENT FALLS:  Life alert? Yes  Use of a cane, walker or w/c? Yes  Grab bars in the bathroom? Yes  Shower chair or bench in shower? No  Elevated toilet seat or a handicapped toilet? No   TIMED UP AND GO:  Was the test performed? No . Telephonic visit  Cognitive Function:    10/17/2017    9:08 AM  MMSE - Mini Mental State Exam  Orientation to time 4  Orientation to Place 5  Registration 3  Attention/ Calculation 4  Recall 2  Language- name 2 objects 2  Language- repeat 1  Language- follow 3 step command 3  Language- read & follow direction 1  Write a sentence 1  Copy design 1  Total score 27        12/28/2021     8:23 AM 12/25/2020    8:34 AM 12/24/2019    8:43 AM 12/13/2018    8:35 AM  6CIT Screen  What Year? 0 points 0 points 0 points 0 points  What month? 0 points 0 points 0 points 0 points  What time? 0 points 0 points 0 points 0 points  Count back from 20 0 points 0 points 0 points 0 points  Months in reverse 0 points 0 points 2 points 0 points  Repeat phrase 0 points 2 points 4 points 0 points  Total Score 0 points 2 points 6 points 0 points    Immunizations Immunization History  Administered Date(s) Administered   Moderna Sars-Covid-2 Vaccination 06/02/2019, 07/05/2019   Pneumococcal Conjugate-13 02/13/2014   Pneumococcal Polysaccharide-23 03/31/2010   Tdap 11/05/2013   Zoster Recombinat (Shingrix) 09/28/2016    TDAP status: Up to date  Flu Vaccine status: Declined, Education has been provided regarding the importance of this vaccine but patient still declined. Advised may receive this vaccine at local pharmacy or Health Dept. Aware to provide a copy of the vaccination record if obtained from local pharmacy or Health Dept. Verbalized acceptance and understanding.  Pneumococcal vaccine status: Up to date  Covid-19  vaccine status: Completed vaccines  Qualifies for Shingles Vaccine? Yes   Zostavax completed Yes   Shingrix Completed?: No.    Education has been provided regarding the importance of this vaccine. Patient has been advised to call insurance company to determine out of pocket expense if they have not yet received this vaccine. Advised may also receive vaccine at local pharmacy or Health Dept. Verbalized acceptance and understanding.  Screening Tests Health Maintenance  Topic Date Due   Zoster Vaccines- Shingrix (2 of 2) 11/23/2016   COVID-19 Vaccine (3 - Moderna risk series) 08/02/2019   TETANUS/TDAP  11/06/2023   Pneumonia Vaccine 33+ Years old  Completed   DEXA SCAN  Completed   HPV VACCINES  Aged Out   MAMMOGRAM  Discontinued   COLONOSCOPY (Pts 45-86yr Insurance  coverage will need to be confirmed)  Discontinued    Health Maintenance  Health Maintenance Due  Topic Date Due   Zoster Vaccines- Shingrix (2 of 2) 11/23/2016   COVID-19 Vaccine (3 - Moderna risk series) 08/02/2019    Colorectal cancer screening: No longer required.   Mammogram status: Completed 10/15/2021. Repeat every year  Bone Density status: Completed 2014. Results reflect: Bone density results: OSTEOPENIA. Repeat every 2 years. Patient declined  Lung Cancer Screening: (Low Dose CT Chest recommended if Age 86-80years, 30 pack-year currently smoking OR have quit w/in 15years.) does not qualify.   Additional Screening:  Hepatitis C Screening: does not qualify  Vision Screening: Recommended annual ophthalmology exams for early detection of glaucoma and other disorders of the eye. Is the patient up to date with their annual eye exam?  Yes  Who is the provider or what is the name of the office in which the patient attends annual eye exams? [facility] is not established with a provider, would they like to be referred to a provider to establish care? No .   Dental Screening: Recommended annual dental exams for proper oral hygiene  Community Resource Referral / Chronic Care Management: CRR required this visit?  No   CCM required this visit?  No      Plan:     I have personally reviewed and noted the following in the patient's chart:   Medical and social history Use of alcohol, tobacco or illicit drugs  Current medications and supplements including opioid prescriptions. Patient is not currently taking opioid prescriptions. Functional ability and status Nutritional status Physical activity Advanced directives List of other physicians Hospitalizations, surgeries, and ER visits in previous 12 months Vitals Screenings to include cognitive, depression, and falls Referrals and appointments  In addition, I have reviewed and discussed with patient  certain preventive protocols, quality metrics, and best practice recommendations. A written personalized care plan for preventive services as well as general preventive health recommendations were provided to patient.     ASandrea Hammond LPN   [id number]/14/[id number]  Nurse Notes: None

## 2021-12-28 NOTE — Patient Instructions (Signed)
Darlene Gregory , Thank you for taking time to come for your Medicare Wellness Visit. I appreciate your ongoing commitment to your health goals. Please review the following plan we discussed and let me know if I can assist you in the future.   Screening recommendations/referrals: Colonoscopy: Done 2014 - no repeat required Mammogram: Done 10/15/2021 - Repeat annually if desired Bone Density: Done 2014 - recommend every 2 years Recommended yearly ophthalmology/optometry visit for glaucoma screening and checkup Recommended yearly dental visit for hygiene and checkup  Vaccinations: Influenza vaccine: Declined - recommended every fall Pneumococcal vaccine: Done 03/31/2010 & 02/13/2014 Tdap vaccine: Done 11/05/2013 - Repeat in 10 years  Shingles vaccine: Zostavax done 2012. Shingrix Done 09/28/2016  - declines second dose - allergic reaction to first Covid-19: Done 06/02/2019 & 07/05/2019 - declines booster  Advanced directives: Please bring a copy of your health care power of attorney and living will to the office to be added to your chart at your convenience.   Conditions/risks identified: Aim for 30 minutes of exercise or brisk walking, 6-8 glasses of water, and 5 servings of fruits and vegetables each day.   Next appointment: Follow up in one year for your annual wellness visit    Preventive Care 65 Years and Older, Female Preventive care refers to lifestyle choices and visits with your health care provider that can promote health and wellness. What does preventive care include? A yearly physical exam. This is also called an annual well check. Dental exams once or twice a year. Routine eye exams. Ask your health care provider how often you should have your eyes checked. Personal lifestyle choices, including: Daily care of your teeth and gums. Regular physical activity. Eating a healthy diet. Avoiding tobacco and drug use. Limiting alcohol use. Practicing safe sex. Taking low-dose aspirin every  day. Taking vitamin and mineral supplements as recommended by your health care provider. What happens during an annual well check? The services and screenings done by your health care provider during your annual well check will depend on your age, overall health, lifestyle risk factors, and family history of disease. Counseling  Your health care provider may ask you questions about your: Alcohol use. Tobacco use. Drug use. Emotional well-being. Home and relationship well-being. Sexual activity. Eating habits. History of falls. Memory and ability to understand (cognition). Work and work Statistician. Reproductive health. Screening  You may have the following tests or measurements: Height, weight, and BMI. Blood pressure. Lipid and cholesterol levels. These may be checked every 5 years, or more frequently if you are over 74 years old. Skin check. Lung cancer screening. You may have this screening every year starting at age 86 if you have a 30-pack-year history of smoking and currently smoke or have quit within the past 15 years. Fecal occult blood test (FOBT) of the stool. You may have this test every year starting at age 18. Flexible sigmoidoscopy or colonoscopy. You may have a sigmoidoscopy every 5 years or a colonoscopy every 10 years starting at age 78. Hepatitis C blood test. Hepatitis B blood test. Sexually transmitted disease (STD) testing. Diabetes screening. This is done by checking your blood sugar (glucose) after you have not eaten for a while (fasting). You may have this done every 1-3 years. Bone density scan. This is done to screen for osteoporosis. You may have this done starting at age 52. Mammogram. This may be done every 1-2 years. Talk to your health care provider about how often you should have regular mammograms. Talk  with your health care provider about your test results, treatment options, and if necessary, the need for more tests. Vaccines  Your health care  provider may recommend certain vaccines, such as: Influenza vaccine. This is recommended every year. Tetanus, diphtheria, and acellular pertussis (Tdap, Td) vaccine. You may need a Td booster every 10 years. Zoster vaccine. You may need this after age 83. Pneumococcal 13-valent conjugate (PCV13) vaccine. One dose is recommended after age 68. Pneumococcal polysaccharide (PPSV23) vaccine. One dose is recommended after age 36. Talk to your health care provider about which screenings and vaccines you need and how often you need them. This information is not intended to replace advice given to you by your health care provider. Make sure you discuss any questions you have with your health care provider. Document Released: 05/23/2015 Document Revised: 01/14/2016 Document Reviewed: 02/25/2015 Elsevier Interactive Patient Education  2017 [employer] Prevention in the Home Falls can cause injuries. They can happen to people of all ages. There are many things you can do to make your home safe and to help prevent falls. What can I do on the outside of my home? Regularly fix the edges of walkways and driveways and fix any cracks. Remove anything that might make you trip as you walk through a door, such as a raised step or threshold. Trim any bushes or trees on the path to your home. Use bright outdoor lighting. Clear any walking paths of anything that might make someone trip, such as rocks or tools. Regularly check to see if handrails are loose or broken. Make sure that both sides of any steps have handrails. Any raised decks and porches should have guardrails on the edges. Have any leaves, snow, or ice cleared regularly. Use sand or salt on walking paths during winter. Clean up any spills in your garage right away. This includes oil or grease spills. What can I do in the bathroom? Use night lights. Install grab bars by the toilet and in the tub and shower. Do not use towel bars as grab  bars. Use non-skid mats or decals in the tub or shower. If you need to sit down in the shower, use a plastic, non-slip stool. Keep the floor dry. Clean up any water that spills on the floor as soon as it happens. Remove soap buildup in the tub or shower regularly. Attach bath mats securely with double-sided non-slip rug tape. Do not have throw rugs and other things on the floor that can make you trip. What can I do in the bedroom? Use night lights. Make sure that you have a light by your bed that is easy to reach. Do not use any sheets or blankets that are too big for your bed. They should not hang down onto the floor. Have a firm chair that has side arms. You can use this for support while you get dressed. Do not have throw rugs and other things on the floor that can make you trip. What can I do in the kitchen? Clean up any spills right away. Avoid walking on wet floors. Keep items that you use a lot in easy-to-reach places. If you need to reach something above you, use a strong step stool that has a grab bar. Keep electrical cords out of the way. Do not use floor polish or wax that makes floors slippery. If you must use wax, use non-skid floor wax. Do not have throw rugs and other things on the floor that can make you  trip. What can I do with my stairs? Do not leave any items on the stairs. Make sure that there are handrails on both sides of the stairs and use them. Fix handrails that are broken or loose. Make sure that handrails are as long as the stairways. Check any carpeting to make sure that it is firmly attached to the stairs. Fix any carpet that is loose or worn. Avoid having throw rugs at the top or bottom of the stairs. If you do have throw rugs, attach them to the floor with carpet tape. Make sure that you have a light switch at the top of the stairs and the bottom of the stairs. If you do not have them, ask someone to add them for you. What else can I do to help prevent  falls? Wear shoes that: Do not have high heels. Have rubber bottoms. Are comfortable and fit you well. Are closed at the toe. Do not wear sandals. If you use a stepladder: Make sure that it is fully opened. Do not climb a closed stepladder. Make sure that both sides of the stepladder are locked into place. Ask someone to hold it for you, if possible. Clearly mark and make sure that you can see: Any grab bars or handrails. First and last steps. Where the edge of each step is. Use tools that help you move around (mobility aids) if they are needed. These include: Canes. Walkers. Scooters. Crutches. Turn on the lights when you go into a dark area. Replace any light bulbs as soon as they burn out. Set up your furniture so you have a clear path. Avoid moving your furniture around. If any of your floors are uneven, fix them. If there are any pets around you, be aware of where they are. Review your medicines with your doctor. Some medicines can make you feel dizzy. This can increase your chance of falling. Ask your doctor what other things that you can do to help prevent falls. This information is not intended to replace advice given to you by your health care provider. Make sure you discuss any questions you have with your health care provider. Document Released: 02/20/2009 Document Revised: 10/02/2015 Document Reviewed: 05/31/2014 Elsevier Interactive Patient Education  2017 [employer].

## 2022-02-04 ENCOUNTER — Ambulatory Visit (INDEPENDENT_AMBULATORY_CARE_PROVIDER_SITE_OTHER): Payer: Medicare PPO

## 2022-02-04 ENCOUNTER — Ambulatory Visit: Payer: Medicare PPO | Admitting: Nurse Practitioner

## 2022-02-04 ENCOUNTER — Encounter: Payer: Self-pay | Admitting: Nurse Practitioner

## 2022-02-04 VITALS — BP 148/82 | HR 60 | Temp 97.0°F | Ht 63.0 in | Wt 159.6 lb

## 2022-02-04 DIAGNOSIS — Z170 Estrogen receptor positive status [ER+]: Secondary | ICD-10-CM

## 2022-02-04 DIAGNOSIS — E034 Atrophy of thyroid (acquired): Secondary | ICD-10-CM

## 2022-02-04 DIAGNOSIS — M1009 Idiopathic gout, multiple sites: Secondary | ICD-10-CM

## 2022-02-04 DIAGNOSIS — Z6832 Body mass index (BMI) 32.0-32.9, adult: Secondary | ICD-10-CM

## 2022-02-04 DIAGNOSIS — R82998 Other abnormal findings in urine: Secondary | ICD-10-CM

## 2022-02-04 DIAGNOSIS — K5730 Diverticulosis of large intestine without perforation or abscess without bleeding: Secondary | ICD-10-CM | POA: Diagnosis not present

## 2022-02-04 DIAGNOSIS — M25511 Pain in right shoulder: Secondary | ICD-10-CM

## 2022-02-04 DIAGNOSIS — K219 Gastro-esophageal reflux disease without esophagitis: Secondary | ICD-10-CM

## 2022-02-04 DIAGNOSIS — C50412 Malignant neoplasm of upper-outer quadrant of left female breast: Secondary | ICD-10-CM

## 2022-02-04 DIAGNOSIS — N3000 Acute cystitis without hematuria: Secondary | ICD-10-CM

## 2022-02-04 DIAGNOSIS — I10 Essential (primary) hypertension: Secondary | ICD-10-CM | POA: Diagnosis not present

## 2022-02-04 DIAGNOSIS — D509 Iron deficiency anemia, unspecified: Secondary | ICD-10-CM

## 2022-02-04 DIAGNOSIS — R609 Edema, unspecified: Secondary | ICD-10-CM

## 2022-02-04 DIAGNOSIS — H6123 Impacted cerumen, bilateral: Secondary | ICD-10-CM

## 2022-02-04 DIAGNOSIS — M19011 Primary osteoarthritis, right shoulder: Secondary | ICD-10-CM | POA: Diagnosis not present

## 2022-02-04 DIAGNOSIS — M810 Age-related osteoporosis without current pathological fracture: Secondary | ICD-10-CM

## 2022-02-04 DIAGNOSIS — R2681 Unsteadiness on feet: Secondary | ICD-10-CM

## 2022-02-04 DIAGNOSIS — N1832 Chronic kidney disease, stage 3b: Secondary | ICD-10-CM

## 2022-02-04 DIAGNOSIS — F411 Generalized anxiety disorder: Secondary | ICD-10-CM

## 2022-02-04 LAB — URINALYSIS, COMPLETE
Bilirubin, UA: NEGATIVE
Glucose, UA: NEGATIVE
Ketones, UA: NEGATIVE
Nitrite, UA: NEGATIVE
RBC, UA: NEGATIVE
Specific Gravity, UA: 1.015 (ref 1.005–1.030)
Urobilinogen, Ur: 0.2 mg/dL (ref 0.2–1.0)
pH, UA: 6.5 (ref 5.0–7.5)

## 2022-02-04 LAB — MICROSCOPIC EXAMINATION
RBC, Urine: NONE SEEN /hpf (ref 0–2)
Renal Epithel, UA: NONE SEEN /hpf
WBC, UA: >30 /hpf — AB (ref 0–5)

## 2022-02-04 MED ORDER — CEPHALEXIN 500 MG PO CAPS: 500.0000 mg | ORAL_CAPSULE | Freq: Two times a day (BID) | ORAL | 0 refills | Status: DC

## 2022-02-04 MED ORDER — OLMESARTAN MEDOXOMIL 40 MG PO TABS: 40.0000 mg | ORAL_TABLET | Freq: Every day | ORAL | 1 refills | Status: DC

## 2022-02-04 MED ORDER — FUROSEMIDE 20 MG PO TABS: 20.0000 mg | ORAL_TABLET | Freq: Every day | ORAL | 1 refills | Status: DC

## 2022-02-04 MED ORDER — LEVOTHYROXINE SODIUM 88 MCG PO TABS: 88.0000 ug | ORAL_TABLET | Freq: Every day | ORAL | 1 refills | Status: DC

## 2022-02-04 MED ORDER — BUSPIRONE HCL 5 MG PO TABS: 5.0000 mg | ORAL_TABLET | Freq: Every day | ORAL | 5 refills | Status: DC | PRN

## 2022-02-04 MED ORDER — ALPRAZOLAM 0.25 MG PO TABS: 0.2500 mg | ORAL_TABLET | Freq: Two times a day (BID) | ORAL | 5 refills | Status: DC | PRN

## 2022-02-04 MED ORDER — OMEPRAZOLE 20 MG PO CPDR: 20.0000 mg | DELAYED_RELEASE_CAPSULE | Freq: Every day | ORAL | 1 refills | Status: DC

## 2022-02-04 MED ORDER — CLONIDINE HCL 0.3 MG PO TABS: 0.3000 mg | ORAL_TABLET | Freq: Two times a day (BID) | ORAL | 1 refills | Status: DC

## 2022-02-04 NOTE — Patient Instructions (Signed)

## 2022-02-04 NOTE — Progress Notes (Signed)
Subjective:    Patient ID: Darlene Gregory, female    DOB: Aug 20, 1933, 88 y.o.   MRN: [id number]   Chief Complaint: Medical Management of Chronic Issues    HPI:  Darlene Gregory is a 88 y.o. who identifies as a female who was assigned female at birth.   Social history: Lives with: alone Work history: retired   Scientist, forensic in today for follow up of the following chronic medical issues:  1. Essential hypertension, benign No c/o chest pain, sob or headache. Does check BP at home, usually runs 170s/80s. BP Readings from Last 3 Encounters:  02/04/22 (!) 148/82  12/01/21 (!) 165/64  08/04/21 (!) 155/69     2. Hypothyroidism due to acquired atrophy of thyroid No issues she is aware of. Lab Results  Component Value Date   TSH 3.940 08/04/2021   T4TOTAL 10.9 08/04/2021     3. Gastroesophageal reflux disease without esophagitis On omeprazole daily which works well for her.  4. Diverticulosis of colon No recent flare ups.  5. Stage 3b chronic kidney disease ([place]) Says urine has been darker lately. Lab Results  Component Value Date   CREATININE 1.79 (H) 08/04/2021     6. Iron deficiency anemia, unspecified iron deficiency anemia type Has some fatigue; no sob or dizziness.  7. Peripheral edema Has some edema to BLE daily, but not bad  8. Acute idiopathic gout of multiple sites No recent flare ups.  9. Malignant neoplasm of upper-outer quadrant of left breast in female, estrogen receptor positive ([place]) Last mammogram on 10/15/21 was negative.  10. Age-related osteoporosis without current pathological fracture No weight bearing exercises. No recent dexascan; does not want one done.  11. GAD (generalized anxiety disorder) Takes xanax at night before bed, works well for her; no adverse effects.    08/04/2021   11:05 AM 08/27/2020    8:46 AM 02/26/2020    8:16 AM 05/24/2019    8:38 AM  GAD 7 : Generalized Anxiety Score  Nervous, Anxious, on Edge 0 0 1 1  Control/stop  worrying 0 0 3 0  Worry too much - different things 0 0 3 0  Trouble relaxing 0 0 0 0  Restless 0 0 0 0  Easily annoyed or irritable 0 0 0 0  Afraid - awful might happen 0 0 0 0  Total GAD 7 Score 0 0 7 1  Anxiety Difficulty Not difficult at all Not difficult at all Not difficult at all Not difficult at all        12. BMI 32.0-32.9,adult Weight is down 3 lbs since last check. Wt Readings from Last 3 Encounters:  02/04/22 159 lb 9.6 oz (72.4 kg)  12/28/21 162 lb (73.5 kg)  12/01/21 162 lb 8 oz (73.7 kg)   BMI Readings from Last 3 Encounters:  02/04/22 28.27 kg/m  12/28/21 28.70 kg/m  12/01/21 28.79 kg/m      New complaints: Right shoulder occasionally hurts.  Allergies  Allergen Reactions   Hyoscyamine Sulfate     REACTION: rash   Norvasc [Amlodipine Besylate] Swelling   Pyridium  [Phenazopyridine Hcl]    Phenazopyridine Hcl Rash   Sulfonamide Derivatives Rash   Outpatient Encounter Medications as of 02/04/2022  Medication Sig   ALPRAZolam (XANAX) 0.25 MG tablet Take 1 tablet (0.25 mg total) by mouth 2 (two) times daily as needed for anxiety.   busPIRone (BUSPAR) 5 MG tablet Take 1 tablet (5 mg total) by mouth daily as needed.   cephALEXin (  KEFLEX) 500 MG capsule Take 1 capsule (500 mg total) by mouth 2 (two) times daily.   cloNIDine (CATAPRES) 0.2 MG tablet Take 1 tablet (0.2 mg total) by mouth 2 (two) times daily.   Ferrous Sulfate (SLOW FE) 142 (45 Fe) MG TBCR Take 1 tablet by mouth daily.   FIBER PO Take 2 each by mouth daily.    fish oil-omega-3 fatty acids 1000 MG capsule Take 1 g by mouth daily.   furosemide (LASIX) 20 MG tablet Take 1 tablet (20 mg total) by mouth daily.   levothyroxine (EUTHYROX) 88 MCG tablet Take 1 tablet (88 mcg total) by mouth daily.   Multiple Vitamins-Minerals (PRESERVISION AREDS PO) Take by mouth.   olmesartan (BENICAR) 40 MG tablet Take 1 tablet (40 mg total) by mouth daily.   omeprazole (PRILOSEC) 20 MG capsule Take 1 capsule  (20 mg total) by mouth daily.   vitamin C (ASCORBIC ACID) 500 MG tablet Take 500 mg by mouth daily.   No facility-administered encounter medications on file as of 02/04/2022.    Past Surgical History:  Procedure Laterality Date   ABDOMINAL HYSTERECTOMY     BREAST LUMPECTOMY WITH RADIOACTIVE SEED LOCALIZATION Left 10/29/2016   Procedure: LEFT BREAST LUMPECTOMY WITH RADIOACTIVE SEED LOCALIZATION;  Surgeon: Excell Seltzer, MD;  Location: [facility];  Service: General;  Laterality: Left;   CAROTID BODY TUMOR EXCISION     COLONOSCOPY W/ POLYPECTOMY     EYE SURGERY Bilateral    cataract removed   JOINT REPLACEMENT Right 2006   knee   MASTECTOMY Left 2018   PAROTIDECTOMY Left    RE-EXCISION OF BREAST LUMPECTOMY Left 11/05/2016   Procedure: RE-EXCISION OF LEFT BREAST LUMPECTOMY;  Surgeon: Excell Seltzer, MD;  Location: [facility];  Service: General;  Laterality: Left;   TOTAL KNEE ARTHROPLASTY Left 03/13/2014   Procedure: LEFT TOTAL KNEE ARTHROPLASTY;  Surgeon: Kerin Salen, MD;  Location: [facility];  Service: Orthopedics;  Laterality: Left;   TOTAL MASTECTOMY Left 11/12/2016   Procedure: LEFT TOTAL MASTECTOMY;  Surgeon: Excell Seltzer, MD;  Location: [facility];  Service: General;  Laterality: Left;    Family History  Problem Relation Age of Onset   Heart disease Mother    Emphysema Father    Cancer Sister        ovarian      Controlled substance contract: 08/28/21     Review of Systems  Constitutional:  Negative for diaphoresis.  Eyes:  Negative for pain.  Respiratory:  Negative for chest tightness and shortness of breath.   Cardiovascular:  Positive for leg swelling. Negative for chest pain and palpitations.       No worse than usual  Gastrointestinal:  Negative for abdominal pain.  Endocrine: Negative for polydipsia.  Genitourinary:  Negative for difficulty urinating, dysuria and flank pain.       Urine has been darker  lately.  Musculoskeletal:        R shoulder hurts occasionally; denies injury.  Neurological:  Negative for dizziness, weakness and headaches.  Hematological:  Does not bruise/bleed easily.  All other systems reviewed and are negative.      Objective:   Physical Exam Vitals and nursing note reviewed.  Constitutional:      General: She is not in acute distress.    Appearance: Normal appearance. She is well-developed. She is not ill-appearing.  HENT:     Head: Normocephalic and atraumatic.     Right Ear: There is impacted  cerumen.     Left Ear: There is impacted cerumen.     Nose: Nose normal.     Mouth/Throat:     Mouth: Mucous membranes are moist.     Pharynx: Oropharynx is clear. Uvula midline.  Eyes:     Conjunctiva/sclera: Conjunctivae normal.     Pupils: Pupils are equal, round, and reactive to light.  Neck:     Thyroid: No thyromegaly or thyroid tenderness.     Vascular: No carotid bruit or JVD.  Cardiovascular:     Rate and Rhythm: Normal rate and regular rhythm.     Pulses: Normal pulses.     Heart sounds: Normal heart sounds, S1 normal and S2 normal.  Pulmonary:     Effort: Pulmonary effort is normal.     Breath sounds: Normal breath sounds. No wheezing, rhonchi or rales.  Chest:     Chest wall: No tenderness.  Abdominal:     General: Bowel sounds are normal. There is no distension.     Palpations: Abdomen is soft. There is no hepatomegaly, splenomegaly or mass.     Tenderness: There is no abdominal tenderness.  Musculoskeletal:        General: Normal range of motion.     Right shoulder: No swelling, deformity, tenderness or crepitus. Normal range of motion. Normal strength.     Cervical back: Normal range of motion and neck supple.     Right lower leg: 1+ Pitting Edema present.     Left lower leg: 1+ Pitting Edema present.  Lymphadenopathy:     Cervical: No cervical adenopathy.  Skin:    General: Skin is warm and dry.     Capillary Refill: Capillary refill  takes 2 to 3 seconds.  Neurological:     Mental Status: She is alert and oriented to person, place, and time.  Psychiatric:        Attention and Perception: Attention normal.        Mood and Affect: Mood and affect normal.        Behavior: Behavior normal.        Thought Content: Thought content normal.        Judgment: Judgment normal.     BP (!) 177/70   Pulse 60   Temp (!) 97 F (36.1 C) (Temporal)   Ht '5\' 3"'$  (1.6 m)   Wt 159 lb 9.6 oz (72.4 kg)   SpO2 97%   BMI 28.27 kg/m   R shoulder xray  U/A +UTI     Assessment & Plan:  Darlene Gregory comes in today with chief complaint of Medical Management of Chronic Issues   Diagnosis and orders addressed:  1. Essential hypertension, benign Low sodium diet. Increased clonidine from 0.'2mg'$  BID to 0.'3mg'$  BID - olmesartan (BENICAR) 40 MG tablet; Take 1 tablet (40 mg total) by mouth daily.  Dispense: 90 tablet; Refill: 1  2. Hypothyroidism due to acquired atrophy of thyroid Labs pending. - levothyroxine (EUTHYROX) 88 MCG tablet; Take 1 tablet (88 mcg total) by mouth daily.  Dispense: 90 tablet; Refill: 1  3. Gastroesophageal reflux disease without esophagitis Avoid spicy foods. Do not eat 2 hours before bedtime. - omeprazole (PRILOSEC) 20 MG capsule; Take 1 capsule (20 mg total) by mouth daily.  Dispense: 90 capsule; Refill: 1  4. Diverticulosis of colon Be mindful of diet to prevent flare ups  5. Stage 3b chronic kidney disease ([place]) Labs pending.  6. Iron deficiency anemia, unspecified iron deficiency anemia type Labs pending.  7. Peripheral edema Keep legs elevated when sitting. - furosemide (LASIX) 20 MG tablet; Take 1 tablet (20 mg total) by mouth daily.  Dispense: 90 tablet; Refill: 1  8. Acute idiopathic gout of multiple sites  9. Malignant neoplasm of upper-outer quadrant of left breast in female, estrogen receptor positive ([place]) Continue mammograms.  10. Age-related osteoporosis without current pathological  fracture Weight bearing exercises encouraged.  11. GAD (generalized anxiety disorder) Stress management. - ALPRAZolam (XANAX) 0.25 MG tablet; Take 1 tablet (0.25 mg total) by mouth 2 (two) times daily as needed for anxiety.  Dispense: 60 tablet; Refill: 5 - busPIRone (BUSPAR) 5 MG tablet; Take 1 tablet (5 mg total) by mouth daily as needed.  Dispense: 30 tablet; Refill: 5  12. BMI 32.0-32.9,adult Discussed diet and exercise for person with BMI >25. Will recheck weight in 3-6 months.  13. Dark urine - Urinalysis, Complete  14. Acute pain of right shoulder Ice or heat - DG Shoulder Right  15. Gait instability Fall precautions - Ambulatory referral to [place]  16. Bilateral hearing loss due to cerumen impaction Debrox drops several times a week to prevent ear wax build-up  17. Acute cystitis without hematuria Force fluids, stay hydrated, proper hygiene. -Urine culture - cephALEXin (KEFLEX) 500 MG capsule; Take 1 capsule (500 mg total) by mouth 2 (two) times daily.  Dispense: 14 capsule; Refill: 0   Labs pending Health Maintenance reviewed Diet and exercise encouraged  Follow up plan: 6 months  Collene Leyden, FNP student    Chevis Pretty, [place]

## 2022-02-05 DIAGNOSIS — H10013 Acute follicular conjunctivitis, bilateral: Secondary | ICD-10-CM | POA: Diagnosis not present

## 2022-02-05 LAB — CBC WITH DIFFERENTIAL/PLATELET
Basophils Absolute: 0 10*3/uL (ref 0.0–0.2)
Basos: 1 %
EOS (ABSOLUTE): 0.2 10*3/uL (ref 0.0–0.4)
Eos: 2 %
Hematocrit: 37.4 % (ref 34.0–46.6)
Hemoglobin: 12 g/dL (ref 11.1–15.9)
Immature Grans (Abs): 0 10*3/uL (ref 0.0–0.1)
Immature Granulocytes: 0 %
Lymphocytes Absolute: 1.4 10*3/uL (ref 0.7–3.1)
Lymphs: 19 %
MCH: 31.5 pg (ref 26.6–33.0)
MCHC: 32.1 g/dL (ref 31.5–35.7)
MCV: 98 fL — ABNORMAL HIGH (ref 79–97)
Monocytes Absolute: 0.5 10*3/uL (ref 0.1–0.9)
Monocytes: 8 %
Neutrophils Absolute: 4.9 10*3/uL (ref 1.4–7.0)
Neutrophils: 70 %
Platelets: 178 10*3/uL (ref 150–450)
RBC: 3.81 x10E6/uL (ref 3.77–5.28)
RDW: 12.3 % (ref 11.7–15.4)
WBC: 7.1 10*3/uL (ref 3.4–10.8)

## 2022-02-05 LAB — CMP14+EGFR
ALT: 24 IU/L (ref 0–32)
AST: 19 IU/L (ref 0–40)
Albumin/Globulin Ratio: 1.5 (ref 1.2–2.2)
Albumin: 4 g/dL (ref 3.7–4.7)
Alkaline Phosphatase: 90 IU/L (ref 44–121)
BUN/Creatinine Ratio: 15 (ref 12–28)
BUN: 23 mg/dL (ref 8–27)
Bilirubin Total: 0.3 mg/dL (ref 0.0–1.2)
CO2: 28 mmol/L (ref 20–29)
Calcium: 9.9 mg/dL (ref 8.7–10.3)
Chloride: 103 mmol/L (ref 96–106)
Creatinine, Ser: 1.54 mg/dL — ABNORMAL HIGH (ref 0.57–1.00)
Globulin, Total: 2.6 g/dL (ref 1.5–4.5)
Glucose: 99 mg/dL (ref 70–99)
Potassium: 4.3 mmol/L (ref 3.5–5.2)
Sodium: 143 mmol/L (ref 134–144)
Total Protein: 6.6 g/dL (ref 6.0–8.5)
eGFR: 33 mL/min/{1.73_m2} — ABNORMAL LOW (ref >59)

## 2022-02-05 LAB — THYROID PANEL WITH TSH
Free Thyroxine Index: 2.9 (ref 1.2–4.9)
T3 Uptake Ratio: 33 % (ref 24–39)
T4, Total: 8.7 ug/dL (ref 4.5–12.0)
TSH: 2.32 u[IU]/mL (ref 0.450–4.500)

## 2022-02-05 LAB — LIPID PANEL
Chol/HDL Ratio: 2 ratio (ref 0.0–4.4)
Cholesterol, Total: 155 mg/dL (ref 100–199)
HDL: 78 mg/dL (ref >39)
LDL Chol Calc (NIH): 64 mg/dL (ref 0–99)
Triglycerides: 64 mg/dL (ref 0–149)
VLDL Cholesterol Cal: 13 mg/dL (ref 5–40)

## 2022-02-05 NOTE — Progress Notes (Signed)
Patient returning call. Please call back

## 2022-02-07 LAB — URINE CULTURE

## 2022-02-08 ENCOUNTER — Telehealth: Payer: Self-pay | Admitting: Nurse Practitioner

## 2022-02-08 NOTE — Telephone Encounter (Signed)
Pt made aware of all lab results and culture results. Pt also made aware that Landry Corporal will be helping with her gait instability. Pt will complete course of Keflex for UTI.

## 2022-02-08 NOTE — Telephone Encounter (Signed)
Patient said she would like to speak to someone about her lab results because she has not talked to anyone about them. Would like to know why the keflex was called in. Please call back.

## 2022-02-22 ENCOUNTER — Ambulatory Visit (INDEPENDENT_AMBULATORY_CARE_PROVIDER_SITE_OTHER): Payer: Medicare PPO

## 2022-02-22 DIAGNOSIS — M1009 Idiopathic gout, multiple sites: Secondary | ICD-10-CM | POA: Diagnosis not present

## 2022-02-22 DIAGNOSIS — K219 Gastro-esophageal reflux disease without esophagitis: Secondary | ICD-10-CM | POA: Diagnosis not present

## 2022-02-22 DIAGNOSIS — N1832 Chronic kidney disease, stage 3b: Secondary | ICD-10-CM

## 2022-02-22 DIAGNOSIS — D509 Iron deficiency anemia, unspecified: Secondary | ICD-10-CM | POA: Diagnosis not present

## 2022-02-22 DIAGNOSIS — I129 Hypertensive chronic kidney disease with stage 1 through stage 4 chronic kidney disease, or unspecified chronic kidney disease: Secondary | ICD-10-CM

## 2022-02-22 DIAGNOSIS — Z96653 Presence of artificial knee joint, bilateral: Secondary | ICD-10-CM

## 2022-02-22 DIAGNOSIS — K5730 Diverticulosis of large intestine without perforation or abscess without bleeding: Secondary | ICD-10-CM

## 2022-02-22 DIAGNOSIS — H6123 Impacted cerumen, bilateral: Secondary | ICD-10-CM

## 2022-02-22 DIAGNOSIS — M810 Age-related osteoporosis without current pathological fracture: Secondary | ICD-10-CM | POA: Diagnosis not present

## 2022-02-22 DIAGNOSIS — D631 Anemia in chronic kidney disease: Secondary | ICD-10-CM | POA: Diagnosis not present

## 2022-02-22 DIAGNOSIS — F411 Generalized anxiety disorder: Secondary | ICD-10-CM

## 2022-02-22 DIAGNOSIS — E034 Atrophy of thyroid (acquired): Secondary | ICD-10-CM

## 2022-02-24 ENCOUNTER — Emergency Department (HOSPITAL_COMMUNITY): Payer: Medicare PPO

## 2022-02-24 ENCOUNTER — Encounter (HOSPITAL_COMMUNITY): Payer: Self-pay

## 2022-02-24 ENCOUNTER — Other Ambulatory Visit: Payer: Self-pay

## 2022-02-24 ENCOUNTER — Emergency Department (HOSPITAL_COMMUNITY)
Admission: EM | Admit: 2022-02-24 | Discharge: 2022-02-24 | Disposition: A | Discharge status: Home / Self Care | Payer: Medicare PPO | Attending: Emergency Medicine | Admitting: Emergency Medicine

## 2022-02-24 DIAGNOSIS — Z79899 Other long term (current) drug therapy: Secondary | ICD-10-CM | POA: Insufficient documentation

## 2022-02-24 DIAGNOSIS — I700 Atherosclerosis of aorta: Secondary | ICD-10-CM | POA: Diagnosis not present

## 2022-02-24 DIAGNOSIS — N3000 Acute cystitis without hematuria: Secondary | ICD-10-CM | POA: Insufficient documentation

## 2022-02-24 DIAGNOSIS — R001 Bradycardia, unspecified: Secondary | ICD-10-CM | POA: Diagnosis not present

## 2022-02-24 DIAGNOSIS — R1031 Right lower quadrant pain: Secondary | ICD-10-CM | POA: Diagnosis present

## 2022-02-24 DIAGNOSIS — M25512 Pain in left shoulder: Secondary | ICD-10-CM | POA: Diagnosis not present

## 2022-02-24 DIAGNOSIS — R519 Headache, unspecified: Secondary | ICD-10-CM | POA: Diagnosis not present

## 2022-02-24 DIAGNOSIS — Z853 Personal history of malignant neoplasm of breast: Secondary | ICD-10-CM | POA: Diagnosis not present

## 2022-02-24 DIAGNOSIS — M25551 Pain in right hip: Secondary | ICD-10-CM | POA: Diagnosis not present

## 2022-02-24 DIAGNOSIS — M542 Cervicalgia: Secondary | ICD-10-CM | POA: Diagnosis not present

## 2022-02-24 DIAGNOSIS — I10 Essential (primary) hypertension: Secondary | ICD-10-CM | POA: Insufficient documentation

## 2022-02-24 DIAGNOSIS — W0110XA Fall on same level from slipping, tripping and stumbling with subsequent striking against unspecified object, initial encounter: Secondary | ICD-10-CM | POA: Diagnosis not present

## 2022-02-24 DIAGNOSIS — R109 Unspecified abdominal pain: Secondary | ICD-10-CM | POA: Diagnosis not present

## 2022-02-24 DIAGNOSIS — W19XXXA Unspecified fall, initial encounter: Secondary | ICD-10-CM

## 2022-02-24 LAB — TROPONIN I (HIGH SENSITIVITY)
Troponin I (High Sensitivity): 11 ng/L (ref <18)
Troponin I (High Sensitivity): 13 ng/L (ref <18)

## 2022-02-24 LAB — CBC WITH DIFFERENTIAL/PLATELET
Abs Immature Granulocytes: 0.01 10*3/uL (ref 0.00–0.07)
Basophils Absolute: 0 10*3/uL (ref 0.0–0.1)
Basophils Relative: 1 %
Eosinophils Absolute: 0.2 10*3/uL (ref 0.0–0.5)
Eosinophils Relative: 3 %
HCT: 38.7 % (ref 36.0–46.0)
Hemoglobin: 12.6 g/dL (ref 12.0–15.0)
Immature Granulocytes: 0 %
Lymphocytes Relative: 19 %
Lymphs Abs: 1.3 10*3/uL (ref 0.7–4.0)
MCH: 32.7 pg (ref 26.0–34.0)
MCHC: 32.6 g/dL (ref 30.0–36.0)
MCV: 100.5 fL — ABNORMAL HIGH (ref 80.0–100.0)
Monocytes Absolute: 0.4 10*3/uL (ref 0.1–1.0)
Monocytes Relative: 7 %
Neutro Abs: 4.9 10*3/uL (ref 1.7–7.7)
Neutrophils Relative %: 70 %
Platelets: 178 10*3/uL (ref 150–400)
RBC: 3.85 MIL/uL — ABNORMAL LOW (ref 3.87–5.11)
RDW: 13.8 % (ref 11.5–15.5)
WBC: 6.8 10*3/uL (ref 4.0–10.5)
nRBC: 0 % (ref 0.0–0.2)

## 2022-02-24 LAB — COMPREHENSIVE METABOLIC PANEL
ALT: 30 U/L (ref 0–44)
AST: 28 U/L (ref 15–41)
Albumin: 3.8 g/dL (ref 3.5–5.0)
Alkaline Phosphatase: 80 U/L (ref 38–126)
Anion gap: 8 (ref 5–15)
BUN: 24 mg/dL — ABNORMAL HIGH (ref 8–23)
CO2: 26 mmol/L (ref 22–32)
Calcium: 9.6 mg/dL (ref 8.9–10.3)
Chloride: 107 mmol/L (ref 98–111)
Creatinine, Ser: 1.53 mg/dL — ABNORMAL HIGH (ref 0.44–1.00)
GFR, Estimated: 33 mL/min — ABNORMAL LOW (ref >60)
Glucose, Bld: 119 mg/dL — ABNORMAL HIGH (ref 70–99)
Potassium: 4 mmol/L (ref 3.5–5.1)
Sodium: 141 mmol/L (ref 135–145)
Total Bilirubin: 0.6 mg/dL (ref 0.3–1.2)
Total Protein: 7 g/dL (ref 6.5–8.1)

## 2022-02-24 LAB — URINALYSIS, ROUTINE W REFLEX MICROSCOPIC
Bilirubin Urine: NEGATIVE
Glucose, UA: NEGATIVE mg/dL
Hgb urine dipstick: NEGATIVE
Ketones, ur: NEGATIVE mg/dL
Nitrite: NEGATIVE
Protein, ur: 30 mg/dL — AB
Specific Gravity, Urine: 1.016 (ref 1.005–1.030)
WBC, UA: >50 WBC/hpf — ABNORMAL HIGH (ref 0–5)
pH: 6 (ref 5.0–8.0)

## 2022-02-24 LAB — LIPASE, BLOOD: Lipase: 28 U/L (ref 11–51)

## 2022-02-24 MED ORDER — FOSFOMYCIN TROMETHAMINE 3 G PO PACK
3.0000 g | PACK | Freq: Once | ORAL | Status: AC
  Administered 2022-02-24: 3 g via ORAL
  Filled 2022-02-24: qty 3

## 2022-02-24 MED ORDER — CLONIDINE HCL 0.1 MG PO TABS
0.2000 mg | ORAL_TABLET | Freq: Once | ORAL | Status: DC
  Filled 2022-02-24: qty 2

## 2022-02-24 MED ORDER — CEPHALEXIN 500 MG PO CAPS: 500.0000 mg | ORAL_CAPSULE | Freq: Three times a day (TID) | ORAL | 0 refills | Status: DC

## 2022-02-24 MED ORDER — SODIUM CHLORIDE 0.9 % IV SOLN
1.0000 g | Freq: Once | INTRAVENOUS | Status: AC
  Administered 2022-02-24: 1 g via INTRAVENOUS
  Filled 2022-02-24: qty 10

## 2022-02-24 MED ORDER — CLONIDINE HCL 0.1 MG PO TABS
0.1000 mg | ORAL_TABLET | Freq: Once | ORAL | Status: AC
  Administered 2022-02-24: 0.1 mg via ORAL
  Filled 2022-02-24: qty 1

## 2022-02-24 MED ORDER — HYDRALAZINE HCL 25 MG PO TABS: 25.0000 mg | ORAL_TABLET | Freq: Once | ORAL | Status: DC

## 2022-02-24 MED ORDER — SODIUM CHLORIDE (PF) 0.9 % IJ SOLN
INTRAMUSCULAR | Status: AC
  Filled 2022-02-24: qty 50

## 2022-02-24 MED ORDER — IOHEXOL 300 MG/ML  SOLN
75.0000 mL | Freq: Once | INTRAMUSCULAR | Status: AC | PRN
  Administered 2022-02-24: 75 mL via INTRAVENOUS

## 2022-02-24 NOTE — ED Notes (Signed)
Unable to update vitals right now because patient is in CT

## 2022-02-24 NOTE — ED Notes (Signed)
Needs IV for CT  

## 2022-02-24 NOTE — ED Notes (Signed)
Discharge pending Antibiotic coming from [facility]

## 2022-02-24 NOTE — ED Provider Notes (Addendum)
88 yo female here with fall 1 week ago, myalgias Multiple CT scans with no acute findings Treating for UTI with rocephin here  Physical Exam  BP (!) 204/64   Pulse (!) 41   Temp 97.7 F (36.5 C) (Oral)   Resp 16   Ht 5' 3.5" (1.613 m)   Wt 72.6 kg   SpO2 100%   BMI 27.90 kg/m   Physical Exam  Procedures  Procedures  ED Course / MDM    Medical Decision Making Amount and/or Complexity of Data Reviewed Labs: ordered. Radiology: ordered. ECG/medicine tests: ordered.  Risk Prescription drug management.   I reassessed the patient following signout given her persistent hypertension.  She is here with her son.  She is adamant that she needs to go home.  She says she is uncomfortable, cold in the ED, and that her pain and discomfort are driving up her blood pressure.  This is absolutely reasonable.  I did explain to them that her pressure was quite high and that they will need to monitor it at home when she is more comfortable, repeat her pressure tonight and tomorrow and then contact her primary care physician if it remains high.  But she is asymptomatic from the perspective of hypertension.  Okay for discharge  Addendum, patient reported she already completed Keflex last week for UTI.  She is allergic to sulfa antibiotics.  We discussed a dose of fosfomycin prior to discharge which she is agreeable to.  No further antibiotics were prescribed per my review of the records      Alaysia Lightle, Carola Rhine, MD 02/24/22 1635    Wyvonnia Dusky, MD 02/24/22 1745

## 2022-02-24 NOTE — ED Provider Notes (Signed)
[facility] DEPT Provider Note   CSN: [id number] Arrival date & time: 02/24/22  0957     History  Chief Complaint  Patient presents with   Lytle Michaels    Darlene Gregory is a 88 y.o. female.  Patient with a history of hypertension, osteoporosis, previous breast cancer presenting with head and neck pain after falling.  States she fell 1 week ago while she was working on her television.  There was an issue with her TV that required her to get up multiple times to keep checking it and she states she lost her balance after getting up too quickly and fell backwards.  Struck her head on the carpet but did not lose consciousness.  She called her son who helped her up.  Over the past week she has had increasing head and neck pain despite taking Tylenol at home.  She is also have some soreness to her left shoulder.  This has been ongoing since the fall and she has been managing with Tylenol.  Over the past 2 days she developed right lower abdominal pain.  This has been constant and progressively worsening.  Does not believe she struck her abdomen while she fell.  Still eating and drinking well without vomiting or diarrhea or constipation.  No pain with urination or blood in the urine.  Does not take any blood thinners.  No previous abdominal surgeries.  Patient and son became concerned this morning because of her progressively worsening abdominal pain.  States she has chronic floaters and issues with her balance which causes her to be unsteady.  This is not acutely changed today.  The history is provided by the patient and a relative.  Fall Associated symptoms include abdominal pain and headaches. Pertinent negatives include no chest pain and no shortness of breath.       Home Medications Prior to Admission medications   Medication Sig Start Date End Date Taking? Authorizing Provider  ALPRAZolam (XANAX) 0.25 MG tablet Take 1 tablet (0.25 mg total) by mouth 2 (two) times  daily as needed for anxiety. 02/04/22   Hassell Done, Mary-Margaret, FNP  busPIRone (BUSPAR) 5 MG tablet Take 1 tablet (5 mg total) by mouth daily as needed. 02/04/22   Hassell Done, Mary-Margaret, FNP  cephALEXin (KEFLEX) 500 MG capsule Take 1 capsule (500 mg total) by mouth 2 (two) times daily. 02/04/22   Hassell Done Mary-Margaret, FNP  cloNIDine (CATAPRES) 0.3 MG tablet Take 1 tablet (0.3 mg total) by mouth 2 (two) times daily. 02/04/22   Hassell Done, Mary-Margaret, FNP  Ferrous Sulfate (SLOW FE) 142 (45 Fe) MG TBCR Take 1 tablet by mouth daily. 03/02/21   Hassell Done, Mary-Margaret, FNP  FIBER PO Take 2 each by mouth daily.     [provider]  fish oil-omega-3 fatty acids 1000 MG capsule Take 1 g by mouth daily.    [provider]  furosemide (LASIX) 20 MG tablet Take 1 tablet (20 mg total) by mouth daily. 02/04/22   Hassell Done Mary-Margaret, FNP  levothyroxine (EUTHYROX) 88 MCG tablet Take 1 tablet (88 mcg total) by mouth daily. 02/04/22   Chevis Pretty, FNP  Multiple Vitamins-Minerals (PRESERVISION AREDS PO) Take by mouth.    [provider]  olmesartan (BENICAR) 40 MG tablet Take 1 tablet (40 mg total) by mouth daily. 02/04/22   Hassell Done, Mary-Margaret, FNP  omeprazole (PRILOSEC) 20 MG capsule Take 1 capsule (20 mg total) by mouth daily. 02/04/22   Hassell Done Mary-Margaret, FNP  vitamin C (ASCORBIC ACID) 500 MG tablet  Take 500 mg by mouth daily.    [provider]      Allergies    Hyoscyamine sulfate, Norvasc [amlodipine besylate], Pyridium  [phenazopyridine hcl], Phenazopyridine hcl, and Sulfonamide derivatives    Review of Systems   Review of Systems  Constitutional:  Negative for activity change, appetite change and fever.  HENT:  Negative for congestion and rhinorrhea.   Respiratory:  Negative for cough, chest tightness and shortness of breath.   Cardiovascular:  Negative for chest pain.  Gastrointestinal:  Positive for abdominal pain. Negative for nausea and vomiting.   Genitourinary:  Negative for dysuria and hematuria.  Musculoskeletal:  Positive for arthralgias, back pain and myalgias.  Skin:  Negative for rash.  Neurological:  Positive for dizziness, light-headedness and headaches. Negative for weakness.    Physical Exam Updated Vital Signs BP (!) 199/71   Pulse (!) 52   Temp 98.4 F (36.9 C) (Oral)   Resp 16   Ht 5' 3.5" (1.613 m)   Wt 72.6 kg   SpO2 97%   BMI 27.90 kg/m  Physical Exam Vitals and nursing note reviewed.  Constitutional:      General: She is not in acute distress.    Appearance: She is well-developed.  HENT:     Head: Normocephalic and atraumatic.     Mouth/Throat:     Pharynx: No oropharyngeal exudate.  Eyes:     Conjunctiva/sclera: Conjunctivae normal.     Pupils: Pupils are equal, round, and reactive to light.  Neck:     Comments: No midline C-spine tenderness Cardiovascular:     Rate and Rhythm: Normal rate and regular rhythm.     Heart sounds: Normal heart sounds. No murmur heard. Pulmonary:     Effort: Pulmonary effort is normal. No respiratory distress.     Breath sounds: Normal breath sounds.  Abdominal:     Palpations: Abdomen is soft.     Tenderness: There is abdominal tenderness. There is no guarding or rebound.     Comments: Right lower quadrant abdominal tenderness, no guarding or rebound.  No ecchymosis.  Musculoskeletal:        General: No tenderness. Normal range of motion.     Cervical back: Normal range of motion and neck supple.     Comments: No midline T or L-spine tenderness. Full range of motion of hips bilaterally without pain. Range of motion of left shoulder intact  Skin:    General: Skin is warm.  Neurological:     Mental Status: She is alert and oriented to person, place, and time.     Cranial Nerves: No cranial nerve deficit.     Motor: No abnormal muscle tone.     Coordination: Coordination normal.     Comments:  5/5 strength throughout. CN 2-12 intact.Equal grip strength.    Psychiatric:        Behavior: Behavior normal.     ED Results / Procedures / Treatments   Labs (all labs ordered are listed, but only abnormal results are displayed) Labs Reviewed  CBC WITH DIFFERENTIAL/PLATELET - Abnormal; Notable for the following components:      Result Value   RBC 3.85 (*)    MCV 100.5 (*)    All other components within normal limits  COMPREHENSIVE METABOLIC PANEL - Abnormal; Notable for the following components:   Glucose, Bld 119 (*)    BUN 24 (*)    Creatinine, Ser 1.53 (*)    GFR, Estimated 33 (*)    All  other components within normal limits  URINALYSIS, ROUTINE W REFLEX MICROSCOPIC - Abnormal; Notable for the following components:   APPearance HAZY (*)    Protein, ur 30 (*)    Leukocytes,Ua LARGE (*)    WBC, UA >50 (*)    Bacteria, UA RARE (*)    All other components within normal limits  URINE CULTURE  LIPASE, BLOOD  TROPONIN I (HIGH SENSITIVITY)  TROPONIN I (HIGH SENSITIVITY)    EKG EKG Interpretation  Date/Time:  Wednesday February 24 2022 15:00:31 EDT Ventricular Rate:  44 PR Interval:  165 QRS Duration: 103 QT Interval:  487 QTC Calculation: 417 R Axis:   4 Text Interpretation: Sinus bradycardia Left ventricular hypertrophy Borderline T abnormalities, inferior leads Nonspecific T wave abnormality Confirmed by Ezequiel Essex [phone]) on 02/24/2022 3:08:55 PM  Radiology CT ABDOMEN PELVIS W CONTRAST  Result Date: 02/24/2022 CLINICAL DATA:  Abdominal pain, acute nonlocalized. Patient reports falling 1 week ago. Right hip pain. EXAM: CT ABDOMEN AND PELVIS WITH CONTRAST TECHNIQUE: Multidetector CT imaging of the abdomen and pelvis was performed using the standard protocol following bolus administration of intravenous contrast. RADIATION DOSE REDUCTION: This exam was performed according to the departmental dose-optimization program which includes automated exposure control, adjustment of the mA and/or kV according to patient size and/or use  of iterative reconstruction technique. CONTRAST:  83m OMNIPAQUE IOHEXOL 300 MG/ML  SOLN COMPARISON:  Right hip radiographs same date. Abdominal ultrasound 12/20/2007. FINDINGS: Lower chest: Clear lung bases. No significant pleural or pericardial effusion. Hepatobiliary: The liver is normal in density without suspicious focal abnormality. Tiny low-density lesion in the left hepatic lobe on image 14/2, likely benign. No evidence of gallstones, gallbladder wall thickening or biliary dilatation. Pancreas: Fatty replaced. No focal mass lesion, ductal dilatation or surrounding inflammation. Spleen: Normal in size without focal abnormality. Adrenals/Urinary Tract: Both adrenal glands appear normal. No evidence of urinary tract calculus, suspicious renal lesion or hydronephrosis. Small amount of air within the bladder lumen, presumably iatrogenic. The bladder otherwise appears unremarkable for its degree of distention. Stomach/Bowel: No enteric contrast administered. The stomach appears unremarkable for its degree of distension. No evidence of bowel wall thickening, distention or surrounding inflammatory change. The appendix appears normal. Distal colonic diverticulosis. Vascular/Lymphatic: There are no enlarged abdominal or pelvic lymph nodes. Aortic and branch vessel atherosclerosis without acute vascular findings. No evidence of aneurysm. The portal, superior mesenteric and splenic veins are patent. Reproductive: Status post hysterectomy.  No adnexal mass. Other: No evidence of abdominal wall mass or hernia. No ascites. Musculoskeletal: No acute or significant osseous findings. No evidence of hip fracture. Multilevel spondylosis with a degenerative grade 1 anterolisthesis at L4-5 contributing to moderate spinal stenosis and mild foraminal narrowing bilaterally. IMPRESSION: 1. No acute findings or explanation for the patient's symptoms. 2. Small amount of air within the bladder lumen, presumably iatrogenic. Correlate for  recent catheterization. 3. Distal colonic diverticulosis without evidence of acute inflammation. 4. Multilevel lumbar spondylosis with a degenerative grade 1 anterolisthesis at L4-5 contributing to moderate spinal stenosis and mild foraminal narrowing bilaterally. 5.  Aortic Atherosclerosis (ICD10-I70.0). Electronically Signed   By: WRichardean SaleM.D.   On: 02/24/2022 14:07   CT Head Wo Contrast  Result Date: 02/24/2022 CLINICAL DATA:  Patient states she fell last Wednesday after losing her balance. Fell backwards and hit her head. Head and neck pain. EXAM: CT HEAD WITHOUT CONTRAST CT CERVICAL SPINE WITHOUT CONTRAST TECHNIQUE: Multidetector CT imaging of the head and cervical spine was performed following the standard  protocol without intravenous contrast. Multiplanar CT image reconstructions of the cervical spine were also generated. RADIATION DOSE REDUCTION: This exam was performed according to the departmental dose-optimization program which includes automated exposure control, adjustment of the mA and/or kV according to patient size and/or use of iterative reconstruction technique. COMPARISON:  None Available. FINDINGS: CT HEAD FINDINGS Brain: No evidence of acute infarction, hemorrhage, hydrocephalus, extra-axial collection or mass lesion/mass effect. Patchy areas of low-attenuation of the periventricular and subcortical white matter presumed advanced chronic microvascular ischemic changes. Mild generalized cerebral atrophy. Vascular: No hyperdense vessel or unexpected calcification. Skull: Normal. Negative for fracture or focal lesion. Sinuses/Orbits: No acute finding. Other: None. CT CERVICAL SPINE FINDINGS Alignment: Enhanced cervical lordosis. Skull base and vertebrae: No acute fracture. No primary bone lesion or focal pathologic process. Osteopenia Soft tissues and spinal canal: No prevertebral fluid or swelling. No visible canal hematoma. Disc levels: C2-C3:  No significant findings C3-C4: Mild  bilateral facet joint arthropathy with mild right neural foraminal stenosis. C4-C5: Moderate left facet joint arthropathy. No significant spinal canal or neural foraminal stenosis. C5-C6: Left facet joint arthropathy. No significant spinal canal or neural foraminal stenosis. C6-C7: Bilateral facet joint arthropathy without significant spinal canal or neural foraminal stenosis. C7-T1:  No significant findings Upper chest: Atherosclerotic calcification of the aorta. Other: None IMPRESSION: CT HEAD: 1. No acute intracranial abnormality. 2. Mild generalized cerebral atrophy and advanced chronic microvascular ischemic changes of the white matter. CT CERVICAL SPINE: 1. No acute fracture or subluxation. Enhanced cervical lordosis. 2. Multilevel facet joint arthropathy without significant spinal canal or neural foraminal stenosis. 3. Osteopenia. Electronically Signed   By: Keane Police D.O.   On: 02/24/2022 14:04   CT Cervical Spine Wo Contrast  Result Date: 02/24/2022 CLINICAL DATA:  Patient states she fell last Wednesday after losing her balance. Fell backwards and hit her head. Head and neck pain. EXAM: CT HEAD WITHOUT CONTRAST CT CERVICAL SPINE WITHOUT CONTRAST TECHNIQUE: Multidetector CT imaging of the head and cervical spine was performed following the standard protocol without intravenous contrast. Multiplanar CT image reconstructions of the cervical spine were also generated. RADIATION DOSE REDUCTION: This exam was performed according to the departmental dose-optimization program which includes automated exposure control, adjustment of the mA and/or kV according to patient size and/or use of iterative reconstruction technique. COMPARISON:  None Available. FINDINGS: CT HEAD FINDINGS Brain: No evidence of acute infarction, hemorrhage, hydrocephalus, extra-axial collection or mass lesion/mass effect. Patchy areas of low-attenuation of the periventricular and subcortical white matter presumed advanced chronic  microvascular ischemic changes. Mild generalized cerebral atrophy. Vascular: No hyperdense vessel or unexpected calcification. Skull: Normal. Negative for fracture or focal lesion. Sinuses/Orbits: No acute finding. Other: None. CT CERVICAL SPINE FINDINGS Alignment: Enhanced cervical lordosis. Skull base and vertebrae: No acute fracture. No primary bone lesion or focal pathologic process. Osteopenia Soft tissues and spinal canal: No prevertebral fluid or swelling. No visible canal hematoma. Disc levels: C2-C3:  No significant findings C3-C4: Mild bilateral facet joint arthropathy with mild right neural foraminal stenosis. C4-C5: Moderate left facet joint arthropathy. No significant spinal canal or neural foraminal stenosis. C5-C6: Left facet joint arthropathy. No significant spinal canal or neural foraminal stenosis. C6-C7: Bilateral facet joint arthropathy without significant spinal canal or neural foraminal stenosis. C7-T1:  No significant findings Upper chest: Atherosclerotic calcification of the aorta. Other: None IMPRESSION: CT HEAD: 1. No acute intracranial abnormality. 2. Mild generalized cerebral atrophy and advanced chronic microvascular ischemic changes of the white matter. CT CERVICAL SPINE: 1.  No acute fracture or subluxation. Enhanced cervical lordosis. 2. Multilevel facet joint arthropathy without significant spinal canal or neural foraminal stenosis. 3. Osteopenia. Electronically Signed   By: Keane Police D.O.   On: 02/24/2022 14:04   DG Shoulder Left  Result Date: 02/24/2022 CLINICAL DATA:  Fall.  Left shoulder pain EXAM: LEFT SHOULDER - 2+ VIEW COMPARISON:  None Available. FINDINGS: There is no evidence of fracture or dislocation. Glenohumeral joint within normal limits. Mild arthropathy of the [facility] joint. Soft tissues are unremarkable. IMPRESSION: Negative. Electronically Signed   By: Davina Poke D.O.   On: 02/24/2022 12:02   DG Hip Unilat  With Pelvis 2-3 Views Right  Result Date:  02/24/2022 CLINICAL DATA:  Right hip pain after fall. EXAM: DG HIP (WITH OR WITHOUT PELVIS) 2-3V RIGHT COMPARISON:  None Available. FINDINGS: There is no evidence of hip fracture or dislocation. Mild narrowing osteophyte formation of the right hip is noted. IMPRESSION: Mild degenerative joint disease of right hip. No acute abnormality seen. Electronically Signed   By: Marijo Conception M.D.   On: 02/24/2022 10:35    Procedures Procedures    Medications Ordered in ED Medications - No data to display  ED Course/ Medical Decision Making/ A&P                           Medical Decision Making Amount and/or Complexity of Data Reviewed Independent Historian: caregiver Labs: ordered. Decision-making details documented in ED Course. Radiology: ordered and independent interpretation performed. Decision-making details documented in ED Course. ECG/medicine tests: ordered and independent interpretation performed. Decision-making details documented in ED Course.  Risk Prescription drug management.  Lost her balance getting up too quickly 1 week ago and fell backward striking her head and left shoulder.  Denies losing consciousness.  Persistent headache since as well as neck pain, left shoulder pain and right lower abdominal pain.  Contrary to triage note she has no right hip pain.  States her abdomen started hurting about 2 days ago.  EKG is sinus rhythm.  Nonspecific T wave changes.   Labs show stable creatinine.  Urinalysis positive for pyuria and possible leukocyte esterase.  Will send for culture. This likely explains her abdominal pain.  Traumatic imaging is negative.  Results reviewed interpreted by me.  CT head and C-spine are negative for acute traumatic injury.  CT abdomen shows no traumatic injury but does show air within the bladder concerning for cystitis.  Blood pressure elevated >329 systolic. She states compliance with her medications and denies any missed doses.  Will recheck, given  clonidine dose now.   Treat UTI, low suspicion for acute traumatic injury.  Anticipate discharge home with improvement in blood pressure Care transferred to Dr. Langston Masker.        Final Clinical Impression(s) / ED Diagnoses Final diagnoses:  Fall, initial encounter  Acute cystitis without hematuria  Hypertension, unspecified type    Rx / DC Orders ED Discharge Orders     None         Yarethzi Branan, Annie Main, MD 02/24/22 1739

## 2022-02-24 NOTE — Discharge Instructions (Addendum)
You were diagnosed with a urinary tract infection given antibiotics in the ER.  You will need to resume antibiotics that were prescribed to your pharmacy, beginning tomorrow morning.  Your blood pressure was also very high in the emergency room today.  This may be due to pain and discomfort of being in the hospital.  I recommend that you recheck your blood pressure tonight when you are comfortable at home, and also tomorrow morning.  If your pressure remains high please call your doctor's office.

## 2022-02-24 NOTE — ED Triage Notes (Signed)
Patient said she fell last Wednesday after losing her balance. Fell backwards and hit her head. Hit her left shoulder and her right hip is causing her pain. Not on any blood thinners.

## 2022-02-26 LAB — URINE CULTURE: Culture: >=100000 — AB

## 2022-02-27 ENCOUNTER — Telehealth (HOSPITAL_BASED_OUTPATIENT_CLINIC_OR_DEPARTMENT_OTHER): Payer: Self-pay

## 2022-02-27 NOTE — Telephone Encounter (Signed)
Post ED Visit - Positive Culture Follow-up  Culture report reviewed by antimicrobial stewardship pharmacist: [facility] Team '[]'$  Elenor Quinones, Pharm.D. '[]'$  Heide Guile, Pharm.D., BCPS AQ-ID '[]'$  Parks Neptune, Pharm.D., BCPS '[]'$  Alycia Rossetti, Pharm.D., BCPS '[]'$  Pastos, Pharm.D., BCPS, AAHIVP '[]'$  [name], Pharm.D., BCPS, AAHIVP '[]'$  Salome Arnt, PharmD, BCPS '[]'$  Johnnette Gourd, PharmD, BCPS '[]'$  Hughes Better, PharmD, BCPS '[]'$  Leeroy Cha, PharmD '[]'$  Laqueta Linden, PharmD, BCPS '[]'$  Albertina Parr, PharmD  [facility] Team '[]'$  Leodis Sias, PharmD '[]'$  Lindell Spar, PharmD '[]'$  Royetta Asal, PharmD '[]'$  Graylin Shiver, Rph '[]'$  Rema Fendt) Darlene Mac, PharmD '[]'$  Arlyn Dunning, PharmD '[]'$  Netta Cedars, PharmD '[x]'$  Dia Sitter, PharmD '[]'$  Leone Haven, PharmD '[]'$  Gretta Arab, PharmD '[]'$  Theodis Shove, PharmD '[]'$  Peggyann Juba, PharmD '[]'$  Reuel Boom, PharmD   Positive urine culture Treated with Cephalexin, organism sensitive to the same and no further patient follow-up is required at this time.  Darlene Gregory 02/27/2022, 8:59 AM

## 2022-03-02 ENCOUNTER — Telehealth: Payer: Self-pay | Admitting: Nurse Practitioner

## 2022-03-02 NOTE — Telephone Encounter (Signed)
Patient reported going to the [facility] on 10/18 for a UTI and was prescribed cephalexin '5mg'$ .

## 2022-03-02 NOTE — Telephone Encounter (Signed)
Ok if she is no better, we need urine specimen

## 2022-03-09 ENCOUNTER — Other Ambulatory Visit: Payer: Medicare PPO

## 2022-03-09 ENCOUNTER — Other Ambulatory Visit: Payer: Self-pay

## 2022-03-09 DIAGNOSIS — R300 Dysuria: Secondary | ICD-10-CM

## 2022-03-09 LAB — URINALYSIS, COMPLETE
Bilirubin, UA: NEGATIVE
Glucose, UA: NEGATIVE
Ketones, UA: NEGATIVE
Nitrite, UA: NEGATIVE
Protein,UA: NEGATIVE
Specific Gravity, UA: 1.01 (ref 1.005–1.030)
Urobilinogen, Ur: 0.2 mg/dL (ref 0.2–1.0)
pH, UA: 6 (ref 5.0–7.5)

## 2022-03-09 LAB — MICROSCOPIC EXAMINATION: Renal Epithel, UA: NONE SEEN /hpf

## 2022-03-10 ENCOUNTER — Ambulatory Visit (INDEPENDENT_AMBULATORY_CARE_PROVIDER_SITE_OTHER): Payer: Medicare PPO

## 2022-03-10 DIAGNOSIS — R300 Dysuria: Secondary | ICD-10-CM

## 2022-03-10 MED ORDER — CEFTRIAXONE SODIUM 1 G IJ SOLR
1.0000 g | Freq: Once | INTRAMUSCULAR | Status: AC
  Administered 2022-03-10: 1 g via INTRAMUSCULAR

## 2022-03-10 NOTE — Progress Notes (Signed)
Rocephin 1 gram given to right upper outer quadrant.  Patient tolerated well.

## 2022-03-12 LAB — URINE CULTURE

## 2022-04-05 ENCOUNTER — Encounter: Payer: Self-pay | Admitting: Nurse Practitioner

## 2022-04-05 ENCOUNTER — Ambulatory Visit (INDEPENDENT_AMBULATORY_CARE_PROVIDER_SITE_OTHER): Payer: Medicare PPO | Admitting: Nurse Practitioner

## 2022-04-05 DIAGNOSIS — R300 Dysuria: Secondary | ICD-10-CM | POA: Diagnosis not present

## 2022-04-05 LAB — URINALYSIS, COMPLETE
Bilirubin, UA: NEGATIVE
Glucose, UA: NEGATIVE
Ketones, UA: NEGATIVE
Leukocytes,UA: NEGATIVE
Nitrite, UA: NEGATIVE
Protein,UA: NEGATIVE
RBC, UA: NEGATIVE
Specific Gravity, UA: 1.015 (ref 1.005–1.030)
Urobilinogen, Ur: 0.2 mg/dL (ref 0.2–1.0)
pH, UA: 5.5 (ref 5.0–7.5)

## 2022-04-05 LAB — MICROSCOPIC EXAMINATION
RBC, Urine: NONE SEEN /hpf (ref 0–2)
Renal Epithel, UA: NONE SEEN /hpf

## 2022-04-05 NOTE — Addendum Note (Signed)
Addended by: Rolena Infante on: 04/05/2022 02:54 PM   Modules accepted: Orders

## 2022-04-05 NOTE — Patient Instructions (Signed)

## 2022-04-05 NOTE — Progress Notes (Signed)
   Virtual Visit  Note Due to COVID-19 pandemic this visit was conducted virtually. This visit type was conducted due to national recommendations for restrictions regarding the COVID-19 Pandemic (e.g. social distancing, sheltering in place) in an effort to limit this patient's exposure and mitigate transmission in our community. All issues noted in this document were discussed and addressed.  A physical exam was not performed with this format.  I connected with Darlene Gregory on 04/05/22 at 1:40 by telephone and verified that I am speaking with the correct person using two identifiers. Darlene Gregory is currently located at home and no one is currently with her during visit. The provider, [name], FNP is located in their office at time of visit.  I discussed the limitations, risks, security and privacy concerns of performing an evaluation and management service by telephone and the availability of in person appointments. I also discussed with the patient that there may be a patient responsible charge related to this service. The patient expressed understanding and agreed to proceed.   History and Present Illness:  Patient has had  a uti for several weeks. She has had 2 rounds of oral antibiotics and a shot of rocephin. She says that the symptoms nevr really went completely away.  Last culture on 03/09/22 did not grow anything  Urinary Tract Infection  This is a recurrent problem. The current episode started 1 to 4 weeks ago. The problem occurs every urination. The problem has been waxing and waning. The quality of the pain is described as burning. The pain is at a severity of 6/10. The patient is experiencing no pain. There has been no fever. She is Not sexually active. There is No history of pyelonephritis. Associated symptoms include urgency. She has tried nothing for the symptoms. The treatment provided no relief.      Review of Systems  Genitourinary:  Positive for urgency.      Observations/Objective: Alert and oriented- answers all questions appropriately No distress Mild intermittent low back pain  Mild suprapubic pain on palpation  Assessment and Plan: Darlene Gregory in today with chief complaint of No chief complaint on file.   1. Dysuria Force fluids AZO OTC Will rate on urine culture to treat since UA looked good    Follow Up Instructions: prn    I discussed the assessment and treatment plan with the patient. The patient was provided an opportunity to ask questions and all were answered. The patient agreed with the plan and demonstrated an understanding of the instructions.   The patient was advised to call back or seek an in-person evaluation if the symptoms worsen or if the condition fails to improve as anticipated.  The above assessment and management plan was discussed with the patient. The patient verbalized understanding of and has agreed to the management plan. Patient is aware to call the clinic if symptoms persist or worsen. Patient is aware when to return to the clinic for a follow-up visit. Patient educated on when it is appropriate to go to the emergency department.   Time call ended:  1:52  I provided 12 minutes of  non face-to-face time during this encounter.    [name], FNP

## 2022-04-06 LAB — URINE CULTURE

## 2022-06-17 ENCOUNTER — Ambulatory Visit (INDEPENDENT_AMBULATORY_CARE_PROVIDER_SITE_OTHER): Payer: Medicare PPO

## 2022-06-17 DIAGNOSIS — K5730 Diverticulosis of large intestine without perforation or abscess without bleeding: Secondary | ICD-10-CM

## 2022-06-17 DIAGNOSIS — K219 Gastro-esophageal reflux disease without esophagitis: Secondary | ICD-10-CM | POA: Diagnosis not present

## 2022-06-17 DIAGNOSIS — M1009 Idiopathic gout, multiple sites: Secondary | ICD-10-CM | POA: Diagnosis not present

## 2022-06-17 DIAGNOSIS — F411 Generalized anxiety disorder: Secondary | ICD-10-CM

## 2022-06-17 DIAGNOSIS — M810 Age-related osteoporosis without current pathological fracture: Secondary | ICD-10-CM | POA: Diagnosis not present

## 2022-06-17 DIAGNOSIS — H6123 Impacted cerumen, bilateral: Secondary | ICD-10-CM

## 2022-06-17 DIAGNOSIS — D631 Anemia in chronic kidney disease: Secondary | ICD-10-CM

## 2022-06-17 DIAGNOSIS — D509 Iron deficiency anemia, unspecified: Secondary | ICD-10-CM

## 2022-06-17 DIAGNOSIS — I129 Hypertensive chronic kidney disease with stage 1 through stage 4 chronic kidney disease, or unspecified chronic kidney disease: Secondary | ICD-10-CM | POA: Diagnosis not present

## 2022-06-17 DIAGNOSIS — N1832 Chronic kidney disease, stage 3b: Secondary | ICD-10-CM

## 2022-06-17 DIAGNOSIS — E034 Atrophy of thyroid (acquired): Secondary | ICD-10-CM

## 2022-07-20 ENCOUNTER — Other Ambulatory Visit: Payer: Self-pay | Admitting: Nurse Practitioner

## 2022-07-30 ENCOUNTER — Telehealth: Payer: Medicare PPO | Admitting: Nurse Practitioner

## 2022-07-30 ENCOUNTER — Encounter: Payer: Self-pay | Admitting: Nurse Practitioner

## 2022-07-30 DIAGNOSIS — Z20822 Contact with and (suspected) exposure to covid-19: Secondary | ICD-10-CM | POA: Diagnosis not present

## 2022-07-30 MED ORDER — MOLNUPIRAVIR EUA 200MG CAPSULE: 4.0000 | ORAL_CAPSULE | Freq: Two times a day (BID) | ORAL | 0 refills | Status: AC

## 2022-07-30 MED ORDER — BENZONATATE 100 MG PO CAPS: 100.0000 mg | ORAL_CAPSULE | Freq: Two times a day (BID) | ORAL | 0 refills | Status: DC | PRN

## 2022-07-30 NOTE — Patient Instructions (Signed)
Darlene Gregory, thank you for joining Chevis Pretty, FNP for today's virtual visit.  While this provider is not your primary care provider (PCP), if your PCP is located in our provider database this encounter information will be shared with them immediately following your visit.   [facility] account gives you access to today's visit and all your visits, tests, and labs performed at [facility] " click here if you don't have a [facility] account or go to [email or web address]  Consent: (Patient) Darlene Gregory provided verbal consent for this virtual visit at the beginning of the encounter.  Current Medications:  Current Outpatient Medications:    benzonatate (TESSALON) 100 MG capsule, Take 1 capsule (100 mg total) by mouth 2 (two) times daily as needed for cough., Disp: 20 capsule, Rfl: 0   molnupiravir EUA (LAGEVRIO) 200 mg CAPS capsule, Take 4 capsules (800 mg total) by mouth 2 (two) times daily for 5 days., Disp: 40 capsule, Rfl: 0   ALPRAZolam (XANAX) 0.25 MG tablet, Take 1 tablet (0.25 mg total) by mouth 2 (two) times daily as needed for anxiety., Disp: 60 tablet, Rfl: 5   busPIRone (BUSPAR) 5 MG tablet, Take 1 tablet (5 mg total) by mouth daily as needed., Disp: 30 tablet, Rfl: 5   cloNIDine (CATAPRES) 0.3 MG tablet, Take 1 tablet by mouth twice daily, Disp: 180 tablet, Rfl: 0   Ferrous Sulfate (SLOW FE) 142 (45 Fe) MG TBCR, Take 1 tablet by mouth daily., Disp: 90 tablet, Rfl: 1   FIBER PO, Take 2 each by mouth daily. , Disp: , Rfl:    fish oil-omega-3 fatty acids 1000 MG capsule, Take 1 g by mouth daily., Disp: , Rfl:    furosemide (LASIX) 20 MG tablet, Take 1 tablet (20 mg total) by mouth daily., Disp: 90 tablet, Rfl: 1   levothyroxine (EUTHYROX) 88 MCG tablet, Take 1 tablet (88 mcg total) by mouth daily., Disp: 90 tablet, Rfl: 1   Multiple Vitamins-Minerals (PRESERVISION AREDS PO), Take by mouth., Disp: , Rfl:    olmesartan (BENICAR) 40 MG  tablet, Take 1 tablet (40 mg total) by mouth daily., Disp: 90 tablet, Rfl: 1   omeprazole (PRILOSEC) 20 MG capsule, Take 1 capsule (20 mg total) by mouth daily., Disp: 90 capsule, Rfl: 1   vitamin C (ASCORBIC ACID) 500 MG tablet, Take 500 mg by mouth daily., Disp: , Rfl:    Medications ordered in this encounter:  Meds ordered this encounter  Medications   molnupiravir EUA (LAGEVRIO) 200 mg CAPS capsule    Sig: Take 4 capsules (800 mg total) by mouth 2 (two) times daily for 5 days.    Dispense:  40 capsule    Refill:  0    Order Specific Question:   Supervising Provider    Answer:   Caryl Pina A [[id number]]   benzonatate (TESSALON) 100 MG capsule    Sig: Take 1 capsule (100 mg total) by mouth 2 (two) times daily as needed for cough.    Dispense:  20 capsule    Refill:  0    Order Specific Question:   Supervising Provider    Answer:   Caryl Pina A [id number]     *If you need refills on other medications prior to your next appointment, please contact your pharmacy*  Follow-Up: Call back or seek an in-person evaluation if the symptoms worsen or if the condition fails to improve as anticipated.  [facility] [phone]  Other  Instructions 1. Take meds as prescribed 2. Use a cool mist humidifier especially during the winter months and when heat has been humid. 3. Use saline nose sprays frequently 4. Saline irrigations of the nose can be very helpful if done frequently.  * 4X daily for 1 week*  * Use of a nettie pot can be helpful with this. Follow directions with this* 5. Drink plenty of fluids 6. Keep thermostat turn down low 7.For any cough or congestion- tessalon perles 8. For fever or aces or pains- take tylenol or ibuprofen appropriate for age and weight.  * for fevers greater than 101 orally you may alternate ibuprofen and tylenol every  3 hours.      If you have been instructed to have an in-person evaluation today at a local Urgent Care  facility, please use the link below. It will take you to a list of all of our available [facility], including address, phone number and hours of operation. Please do not delay care.  [facility]  If you or a family member do not have a primary care provider, use the link below to schedule a visit and establish care. When you choose a [facility] primary care physician or advanced practice provider, you gain a long-term partner in health. Find a Primary Care Provider  Learn more about [facility] in-office and virtual care options: [facility]

## 2022-07-30 NOTE — Progress Notes (Signed)
Virtual Visit Consent   [name], you are scheduled for a virtual visit with [name], [place], a [facility] provider, today.     Just as with appointments in the office, your consent must be obtained to participate.  Your consent will be active for this visit and any virtual visit you may have with one of our providers in the next 365 days.     If you have a MyChart account, a copy of this consent can be sent to you electronically.  All virtual visits are billed to your insurance company just like a traditional visit in the office.    As this is a virtual visit, video technology does not allow for your provider to perform a traditional examination.  This may limit your provider's ability to fully assess your condition.  If your provider identifies any concerns that need to be evaluated in person or the need to arrange testing (such as labs, EKG, etc.), we will make arrangements to do so.     Although advances in technology are sophisticated, we cannot ensure that it will always work on either your end or our end.  If the connection with a video visit is poor, the visit may have to be switched to a telephone visit.  With either a video or telephone visit, we are not always able to ensure that we have a secure connection.     I need to obtain your verbal consent now.   Are you willing to proceed with your visit today? YES   ZAKIRA PAHNKE has provided verbal consent on 07/30/2022 for a virtual visit (video or telephone).   [name], FNP   Date: 07/30/2022 10:48 AM   Virtual Visit via Video Note   I, [name], connected with SHAVANA CHEW ([id number], 1934-04-28) on 07/30/22 at 10:50 AM EDT by a video-enabled telemedicine application and verified that I am speaking with the correct person using two identifiers.  Location: Patient: Virtual Visit Location Patient: Home Provider: Virtual Visit Location Provider: Mobile   I discussed the limitations of  evaluation and management by telemedicine and the availability of in person appointments. The patient expressed understanding and agreed to proceed.    History of Present Illness: BLEN HERTER is a 89 y.o. who identifies as a female who was assigned female at birth, and is being seen today for covid.  HPI: Son came in to visit the weekend and was sick. He tested positive for covid yesterday. Patient is starting to feel bad with cough an congestion. Has lost her taste and smell.    Review of Systems  Constitutional:  Positive for chills and malaise/fatigue. Negative for fever.  Respiratory:  Positive for cough and sputum production. Negative for shortness of breath.   Neurological:  Negative for dizziness and headaches.    Problems:  Patient Active Problem List   Diagnosis Date Noted   Peripheral edema 03/02/2021   CKD (chronic kidney disease), stage III ([place]) 09/26/2017   Gout 08/23/2017   Malignant neoplasm of upper-outer quadrant of left breast in female, estrogen receptor positive ([place]) 09/30/2016   GAD (generalized anxiety disorder) 07/24/2015   BMI 32.0-32.9,adult 12/10/2014   Arthritis of knee 03/13/2014   Hypothyroidism 02/13/2014   Essential hypertension, benign 08/24/2012   Hemorrhoids 12/18/2007   Iron deficiency anemia 11/22/2007   GERD 11/22/2007   Diaphragmatic hernia 11/22/2007   Diverticulosis of colon 11/22/2007   POSTMENOPAUSAL STATUS 11/22/2007   Osteoporosis 11/22/2007   COLONIC  POLYPS, ADENOMATOUS, HX OF 11/22/2007   SCHATZKI'S RING, HX OF 11/22/2007    Allergies:  Allergies  Allergen Reactions   Hyoscyamine Sulfate     REACTION: rash   Norvasc [Amlodipine Besylate] Swelling   Pyridium  [Phenazopyridine Hcl]    Phenazopyridine Hcl Rash   Sulfonamide Derivatives Rash   Medications:  Current Outpatient Medications:    ALPRAZolam (XANAX) 0.25 MG tablet, Take 1 tablet (0.25 mg total) by mouth 2 (two) times daily as needed for anxiety., Disp: 60 tablet,  Rfl: 5   busPIRone (BUSPAR) 5 MG tablet, Take 1 tablet (5 mg total) by mouth daily as needed., Disp: 30 tablet, Rfl: 5   cloNIDine (CATAPRES) 0.3 MG tablet, Take 1 tablet by mouth twice daily, Disp: 180 tablet, Rfl: 0   Ferrous Sulfate (SLOW FE) 142 (45 Fe) MG TBCR, Take 1 tablet by mouth daily., Disp: 90 tablet, Rfl: 1   FIBER PO, Take 2 each by mouth daily. , Disp: , Rfl:    fish oil-omega-3 fatty acids 1000 MG capsule, Take 1 g by mouth daily., Disp: , Rfl:    furosemide (LASIX) 20 MG tablet, Take 1 tablet (20 mg total) by mouth daily., Disp: 90 tablet, Rfl: 1   levothyroxine (EUTHYROX) 88 MCG tablet, Take 1 tablet (88 mcg total) by mouth daily., Disp: 90 tablet, Rfl: 1   Multiple Vitamins-Minerals (PRESERVISION AREDS PO), Take by mouth., Disp: , Rfl:    olmesartan (BENICAR) 40 MG tablet, Take 1 tablet (40 mg total) by mouth daily., Disp: 90 tablet, Rfl: 1   omeprazole (PRILOSEC) 20 MG capsule, Take 1 capsule (20 mg total) by mouth daily., Disp: 90 capsule, Rfl: 1   vitamin C (ASCORBIC ACID) 500 MG tablet, Take 500 mg by mouth daily., Disp: , Rfl:   Observations/Objective: Patient is well-developed, well-nourished in no acute distress.  Resting comfortably  at home.  Head is normocephalic, atraumatic.  No labored breathing.  Speech is clear and coherent with logical content.  Patient is alert and oriented at baseline.  Raspy voice Deep cough  Assessment and Plan:  [name] in today with chief complaint of No chief complaint on file.   1. Close exposure to COVID-19 virus 1. Take meds as prescribed 2. Use a cool mist humidifier especially during the winter months and when heat has been humid. 3. Use saline nose sprays frequently 4. Saline irrigations of the nose can be very helpful if done frequently.  * 4X daily for 1 week*  * Use of a nettie pot can be helpful with this. Follow directions with this* 5. Drink plenty of fluids 6. Keep thermostat turn down low 7.For any cough  or congestion- tessalon perles 8. For fever or aces or pains- take tylenol or ibuprofen appropriate for age and weight.  * for fevers greater than 101 orally you may alternate ibuprofen and tylenol every  3 hours.     Follow Up Instructions: I discussed the assessment and treatment plan with the patient. The patient was provided an opportunity to ask questions and all were answered. The patient agreed with the plan and demonstrated an understanding of the instructions.  A copy of instructions were sent to the patient via MyChart.  The patient was advised to call back or seek an in-person evaluation if the symptoms worsen or if the condition fails to improve as anticipated.  Time:  I spent 8 minutes with the patient via telehealth technology discussing the above problems/concerns.    [name],  FNP  

## 2022-08-05 ENCOUNTER — Ambulatory Visit: Payer: Medicare PPO | Admitting: Nurse Practitioner

## 2022-08-06 ENCOUNTER — Other Ambulatory Visit: Payer: Self-pay | Admitting: Nurse Practitioner

## 2022-08-06 DIAGNOSIS — F411 Generalized anxiety disorder: Secondary | ICD-10-CM

## 2022-08-09 ENCOUNTER — Other Ambulatory Visit: Payer: Self-pay | Admitting: Nurse Practitioner

## 2022-08-09 DIAGNOSIS — F411 Generalized anxiety disorder: Secondary | ICD-10-CM

## 2022-08-12 ENCOUNTER — Ambulatory Visit: Payer: Medicare PPO | Admitting: Nurse Practitioner

## 2022-08-12 ENCOUNTER — Encounter: Payer: Self-pay | Admitting: Nurse Practitioner

## 2022-08-12 VITALS — BP 136/72 | HR 55 | Temp 97.6°F | Resp 20 | Ht 63.0 in | Wt 153.0 lb

## 2022-08-12 DIAGNOSIS — R300 Dysuria: Secondary | ICD-10-CM

## 2022-08-12 DIAGNOSIS — I10 Essential (primary) hypertension: Secondary | ICD-10-CM

## 2022-08-12 DIAGNOSIS — R600 Localized edema: Secondary | ICD-10-CM

## 2022-08-12 DIAGNOSIS — D509 Iron deficiency anemia, unspecified: Secondary | ICD-10-CM

## 2022-08-12 DIAGNOSIS — M810 Age-related osteoporosis without current pathological fracture: Secondary | ICD-10-CM

## 2022-08-12 DIAGNOSIS — M1009 Idiopathic gout, multiple sites: Secondary | ICD-10-CM

## 2022-08-12 DIAGNOSIS — E034 Atrophy of thyroid (acquired): Secondary | ICD-10-CM | POA: Diagnosis not present

## 2022-08-12 DIAGNOSIS — K449 Diaphragmatic hernia without obstruction or gangrene: Secondary | ICD-10-CM | POA: Diagnosis not present

## 2022-08-12 DIAGNOSIS — K219 Gastro-esophageal reflux disease without esophagitis: Secondary | ICD-10-CM | POA: Diagnosis not present

## 2022-08-12 DIAGNOSIS — M2550 Pain in unspecified joint: Secondary | ICD-10-CM | POA: Diagnosis not present

## 2022-08-12 DIAGNOSIS — F411 Generalized anxiety disorder: Secondary | ICD-10-CM | POA: Diagnosis not present

## 2022-08-12 DIAGNOSIS — N1832 Chronic kidney disease, stage 3b: Secondary | ICD-10-CM

## 2022-08-12 DIAGNOSIS — R5383 Other fatigue: Secondary | ICD-10-CM | POA: Diagnosis not present

## 2022-08-12 DIAGNOSIS — Z6832 Body mass index (BMI) 32.0-32.9, adult: Secondary | ICD-10-CM

## 2022-08-12 DIAGNOSIS — K5730 Diverticulosis of large intestine without perforation or abscess without bleeding: Secondary | ICD-10-CM

## 2022-08-12 LAB — LIPID PANEL

## 2022-08-12 MED ORDER — OMEPRAZOLE 20 MG PO CPDR: 20.0000 mg | DELAYED_RELEASE_CAPSULE | Freq: Every day | ORAL | 1 refills | Status: DC

## 2022-08-12 MED ORDER — LEVOTHYROXINE SODIUM 88 MCG PO TABS: 88.0000 ug | ORAL_TABLET | Freq: Every day | ORAL | 1 refills | Status: DC

## 2022-08-12 MED ORDER — CLONIDINE HCL 0.3 MG PO TABS: 0.3000 mg | ORAL_TABLET | Freq: Two times a day (BID) | ORAL | 1 refills | Status: DC

## 2022-08-12 MED ORDER — OLMESARTAN MEDOXOMIL 40 MG PO TABS
40.0000 mg | ORAL_TABLET | Freq: Every day | ORAL | 1 refills | Status: DC
Start: 2022-08-12 — End: 2023-02-14

## 2022-08-12 MED ORDER — SLOW FE 142 (45 FE) MG PO TBCR
1.0000 | EXTENDED_RELEASE_TABLET | Freq: Every day | ORAL | 1 refills | Status: AC
Start: 2022-08-12 — End: ?

## 2022-08-12 MED ORDER — ALPRAZOLAM 0.25 MG PO TABS
0.2500 mg | ORAL_TABLET | Freq: Two times a day (BID) | ORAL | 5 refills | Status: DC | PRN
Start: 2022-08-12 — End: 2023-02-14

## 2022-08-12 MED ORDER — FUROSEMIDE 20 MG PO TABS
20.0000 mg | ORAL_TABLET | Freq: Every day | ORAL | 1 refills | Status: DC
Start: 2022-08-12 — End: 2023-02-14

## 2022-08-12 MED ORDER — BUSPIRONE HCL 5 MG PO TABS
5.0000 mg | ORAL_TABLET | Freq: Every day | ORAL | 5 refills | Status: DC | PRN
Start: 2022-08-12 — End: 2023-02-14

## 2022-08-12 NOTE — Patient Instructions (Signed)

## 2022-08-12 NOTE — Progress Notes (Addendum)
Subjective:    Patient ID: Darlene Gregory, female    DOB: 10-Feb-1934, 89 y.o.   MRN: [id number]   Chief Complaint: medical management of chronic issues     HPI:  Darlene Gregory is a 89 y.o. who identifies as a female who was assigned female at birth.   Social history: Lives with: by herself Work history: retired   Scientist, forensic in today for follow up of the following chronic medical issues:  1. Essential hypertension, benign Np c/o chest pain, sob or headache. Doe snot check blood pressure at home. BP Readings from Last 3 Encounters:  02/24/22 (!) 202/78  02/04/22 (!) 148/82  12/01/21 (!) 165/64     2. Diaphragmatic hernia without obstruction and without gangrene No problems unless over eating  3. Diverticulosis of colon No recent flare ups  4. Gastroesophageal reflux disease without esophagitis Is on omeprazle daily and is doing well.  5. Hypothyroidism due to acquired atrophy of thyroid No issues that she is aware of. Lab Results  Component Value Date   TSH 2.320 02/04/2022     6. Stage 3b chronic kidney disease Lab Results  Component Value Date   CREATININE 1.53 (H) 02/24/2022     7. Iron deficiency anemia, unspecified iron deficiency anemia type No c/o fatigue Lab Results  Component Value Date   HGB 12.6 02/24/2022     8. GAD (generalized anxiety disorder) Is on xanax bid. Takes buspar some days instead of xanax    08/12/2022    9:59 AM 08/04/2021   11:05 AM 08/27/2020    8:46 AM 02/26/2020    8:16 AM  GAD 7 : Generalized Anxiety Score  Nervous, Anxious, on Edge 0 0 0 1  Control/stop worrying 0 0 0 3  Worry too much - different things 0 0 0 3  Trouble relaxing 0 0 0 0  Restless 0 0 0 0  Easily annoyed or irritable 0 0 0 0  Afraid - awful might happen 0 0 0 0  Total GAD 7 Score 0 0 0 7  Anxiety Difficulty Not difficult at all Not difficult at all Not difficult at all Not difficult at all      9. Acute idiopathic gout of multiple sites No recent  flare  ups  10. Peripheral edema Has daily lower ext edema  11. Age-related osteoporosis without current pathological fracture Last dexascan was in 2014. Patient has been refusing  to repeat.  12. BMI 32.0-32.9,adult Weight is down 7lbs Wt Readings from Last 3 Encounters:  08/12/22 153 lb (69.4 kg)  02/24/22 160 lb (72.6 kg)  02/04/22 159 lb 9.6 oz (72.4 kg)   BMI Readings from Last 3 Encounters:  08/12/22 27.10 kg/m  02/24/22 27.90 kg/m  02/04/22 28.27 kg/m     New complaints: Has been very fatigued. Does not feel like doing anything Also c/o joint pains. Varies in different joins from day at day.  Allergies  Allergen Reactions   Hyoscyamine Sulfate     REACTION: rash   Norvasc [Amlodipine Besylate] Swelling   Pyridium  [Phenazopyridine Hcl]    Phenazopyridine Hcl Rash   Sulfonamide Derivatives Rash   Outpatient Encounter Medications as of 08/12/2022  Medication Sig   ALPRAZolam (XANAX) 0.25 MG tablet Take 1 tablet (0.25 mg total) by mouth 2 (two) times daily as needed for anxiety.   benzonatate (TESSALON) 100 MG capsule Take 1 capsule (100 mg total) by mouth 2 (two) times daily as needed for cough.  busPIRone (BUSPAR) 5 MG tablet Take 1 tablet (5 mg total) by mouth daily as needed.   cloNIDine (CATAPRES) 0.3 MG tablet Take 1 tablet by mouth twice daily   Ferrous Sulfate (SLOW FE) 142 (45 Fe) MG TBCR Take 1 tablet by mouth daily.   FIBER PO Take 2 each by mouth daily.    fish oil-omega-3 fatty acids 1000 MG capsule Take 1 g by mouth daily.   furosemide (LASIX) 20 MG tablet Take 1 tablet (20 mg total) by mouth daily.   levothyroxine (EUTHYROX) 88 MCG tablet Take 1 tablet (88 mcg total) by mouth daily.   Multiple Vitamins-Minerals (PRESERVISION AREDS PO) Take by mouth.   olmesartan (BENICAR) 40 MG tablet Take 1 tablet (40 mg total) by mouth daily.   omeprazole (PRILOSEC) 20 MG capsule Take 1 capsule (20 mg total) by mouth daily.   vitamin C (ASCORBIC ACID) 500 MG  tablet Take 500 mg by mouth daily.   No facility-administered encounter medications on file as of 08/12/2022.    Past Surgical History:  Procedure Laterality Date   ABDOMINAL HYSTERECTOMY     BREAST LUMPECTOMY WITH RADIOACTIVE SEED LOCALIZATION Left 10/29/2016   Procedure: LEFT BREAST LUMPECTOMY WITH RADIOACTIVE SEED LOCALIZATION;  Surgeon: Excell Seltzer, MD;  Location: [facility];  Service: General;  Laterality: Left;   CAROTID BODY TUMOR EXCISION     COLONOSCOPY W/ POLYPECTOMY     EYE SURGERY Bilateral    cataract removed   JOINT REPLACEMENT Right 2006   knee   MASTECTOMY Left 2018   PAROTIDECTOMY Left    RE-EXCISION OF BREAST LUMPECTOMY Left 11/05/2016   Procedure: RE-EXCISION OF LEFT BREAST LUMPECTOMY;  Surgeon: Excell Seltzer, MD;  Location: [facility];  Service: General;  Laterality: Left;   TOTAL KNEE ARTHROPLASTY Left 03/13/2014   Procedure: LEFT TOTAL KNEE ARTHROPLASTY;  Surgeon: Kerin Salen, MD;  Location: [facility];  Service: Orthopedics;  Laterality: Left;   TOTAL MASTECTOMY Left 11/12/2016   Procedure: LEFT TOTAL MASTECTOMY;  Surgeon: Excell Seltzer, MD;  Location: [facility];  Service: General;  Laterality: Left;    Family History  Problem Relation Age of Onset   Heart disease Mother    Emphysema Father    Cancer Sister        ovarian      Controlled substance contract: 08/12/22     Review of Systems  Constitutional:  Negative for diaphoresis.  Eyes:  Negative for pain.  Respiratory:  Negative for shortness of breath.   Cardiovascular:  Negative for chest pain, palpitations and leg swelling.  Gastrointestinal:  Negative for abdominal pain.  Endocrine: Negative for polydipsia.  Musculoskeletal:  Positive for arthralgias.  Skin:  Negative for rash.  Neurological:  Negative for dizziness, weakness and headaches.  Hematological:  Does not bruise/bleed easily.  All other systems reviewed and are  negative.      Objective:   Physical Exam Vitals and nursing note reviewed.  Constitutional:      General: She is not in acute distress.    Appearance: Normal appearance. She is well-developed.  HENT:     Head: Normocephalic.     Right Ear: Tympanic membrane normal.     Left Ear: Tympanic membrane normal.     Nose: Nose normal.     Mouth/Throat:     Mouth: Mucous membranes are moist.  Eyes:     Pupils: Pupils are equal, round, and reactive to light.  Neck:  Vascular: No carotid bruit or JVD.  Cardiovascular:     Rate and Rhythm: Normal rate and regular rhythm.     Heart sounds: Murmur (2/6) heard.  Pulmonary:     Effort: Pulmonary effort is normal. No respiratory distress.     Breath sounds: Normal breath sounds. No wheezing or rales.  Chest:     Chest wall: No tenderness.  Abdominal:     General: Bowel sounds are normal. There is no distension or abdominal bruit.     Palpations: Abdomen is soft. There is no hepatomegaly, splenomegaly, mass or pulsatile mass.     Tenderness: There is no abdominal tenderness.  Musculoskeletal:        General: Normal range of motion.     Cervical back: Normal range of motion and neck supple.  Lymphadenopathy:     Cervical: No cervical adenopathy.  Skin:    General: Skin is warm and dry.  Neurological:     Mental Status: She is alert and oriented to person, place, and time.     Deep Tendon Reflexes: Reflexes are normal and symmetric.  Psychiatric:        Behavior: Behavior normal.        Thought Content: Thought content normal.        Judgment: Judgment normal.    .BP 136/72   Pulse (!) 55   Temp 97.6 F (36.4 C) (Temporal)   Resp 20   Ht 5\' 3"  (1.6 m)   Wt 153 lb (69.4 kg)   SpO2 99%   BMI 27.10 kg/m          Assessment & Plan:   MARYLYNN SIMS comes in today with chief complaint of Medical Management of Chronic Issues   Diagnosis and orders addressed:  1. Essential hypertension, benign Low sodium diet - CBC  with Differential/Platelet - CMP14+EGFR - Lipid panel - olmesartan (BENICAR) 40 MG tablet; Take 1 tablet (40 mg total) by mouth daily.  Dispense: 90 tablet; Refill: 1  2. Diaphragmatic hernia without obstruction and without gangrene Do not over eat  3. Diverticulosis of colon Watch diet to prevent flare up  4. Gastroesophageal reflux disease without esophagitis Avoid spicy foods Do not eat 2 hours prior to bedtime  - omeprazole (PRILOSEC) 20 MG capsule; Take 1 capsule (20 mg total) by mouth daily.  Dispense: 90 capsule; Refill: 1  5. Hypothyroidism due to acquired atrophy of thyroid Labs oending - Thyroid Panel With TSH - levothyroxine (EUTHYROX) 88 MCG tablet; Take 1 tablet (88 mcg total) by mouth daily.  Dispense: 90 tablet; Refill: 1  6. Stage 3b chronic kidney disease Labs pending  7. Iron deficiency anemia, unspecified iron deficiency anemia type Stopped iron- needs to start back taking - Ferrous Sulfate (SLOW FE) 142 (45 Fe) MG TBCR; Take 1 tablet (142 mg total) by mouth daily.  Dispense: 90 tablet; Refill: 1  8. GAD (generalized anxiety disorder) Stress management - Drug Screen 10 W/Conf, Se - ALPRAZolam (XANAX) 0.25 MG tablet; Take 1 tablet (0.25 mg total) by mouth 2 (two) times daily as needed for anxiety.  Dispense: 60 tablet; Refill: 5 - busPIRone (BUSPAR) 5 MG tablet; Take 1 tablet (5 mg total) by mouth daily as needed.  Dispense: 30 tablet; Refill: 5  9. Acute idiopathic gout of multiple sites  10. Peripheral edema Elevate legs when sitting - furosemide (LASIX) 20 MG tablet; Take 1 tablet (20 mg total) by mouth daily.  Dispense: 90 tablet; Refill: 1  11. Age-related osteoporosis  without current pathological fracture Weight bearing exercises  12. BMI 32.0-32.9,adult Discussed diet and exercise for person with BMI >25 Will recheck weight in 3-6 months   13. Dysuria - Urinalysis, Complete - Urine Culture  14. Other fatigue Labs pending - Vitamin B12 -  VITAMIN D 25 Hydroxy (Vit-D Deficiency, Fractures)  15. Arthralgia, unspecified joint Labs pending - Arthritis Panel   Labs pending Health Maintenance reviewed Diet and exercise encouraged  Follow up plan: 6 months   Darlene Gregory Done, FNP

## 2022-08-16 LAB — DRUG SCREEN 10 W/CONF, SERUM
Amphetamines, IA: NEGATIVE ng/mL
Barbiturates, IA: NEGATIVE ug/mL
Benzodiazepines, IA: NEGATIVE ng/mL
Cocaine & Metabolite, IA: NEGATIVE ng/mL
Methadone, IA: NEGATIVE ng/mL
Opiates, IA: NEGATIVE ng/mL
Oxycodones, IA: NEGATIVE ng/mL
Phencyclidine, IA: NEGATIVE ng/mL
Propoxyphene, IA: NEGATIVE ng/mL
THC(Marijuana) Metabolite, IA: NEGATIVE ng/mL

## 2022-08-16 LAB — LIPID PANEL
Chol/HDL Ratio: 2.3 ratio (ref 0.0–4.4)
Cholesterol, Total: 154 mg/dL (ref 100–199)
HDL: 68 mg/dL (ref >39)
LDL Chol Calc (NIH): 69 mg/dL (ref 0–99)
Triglycerides: 94 mg/dL (ref 0–149)
VLDL Cholesterol Cal: 17 mg/dL (ref 5–40)

## 2022-08-16 LAB — CMP14+EGFR
ALT: 13 IU/L (ref 0–32)
AST: 12 IU/L (ref 0–40)
Albumin/Globulin Ratio: 1.7 (ref 1.2–2.2)
Albumin: 4.1 g/dL (ref 3.7–4.7)
Alkaline Phosphatase: 102 IU/L (ref 44–121)
BUN/Creatinine Ratio: 21 (ref 12–28)
BUN: 31 mg/dL — ABNORMAL HIGH (ref 8–27)
Bilirubin Total: 0.5 mg/dL (ref 0.0–1.2)
CO2: 25 mmol/L (ref 20–29)
Calcium: 9.6 mg/dL (ref 8.7–10.3)
Chloride: 102 mmol/L (ref 96–106)
Creatinine, Ser: 1.5 mg/dL — ABNORMAL HIGH (ref 0.57–1.00)
Globulin, Total: 2.4 g/dL (ref 1.5–4.5)
Glucose: 108 mg/dL — ABNORMAL HIGH (ref 70–99)
Potassium: 4.2 mmol/L (ref 3.5–5.2)
Sodium: 142 mmol/L (ref 134–144)
Total Protein: 6.5 g/dL (ref 6.0–8.5)
eGFR: 34 mL/min/{1.73_m2} — ABNORMAL LOW (ref >59)

## 2022-08-16 LAB — ARTHRITIS PANEL
Basophils Absolute: 0.1 10*3/uL (ref 0.0–0.2)
Basos: 1 %
EOS (ABSOLUTE): 0.2 10*3/uL (ref 0.0–0.4)
Eos: 2 %
Hematocrit: 38.3 % (ref 34.0–46.6)
Hemoglobin: 12.3 g/dL (ref 11.1–15.9)
Immature Grans (Abs): 0 10*3/uL (ref 0.0–0.1)
Immature Granulocytes: 0 %
Lymphocytes Absolute: 1.6 10*3/uL (ref 0.7–3.1)
Lymphs: 20 %
MCH: 31.1 pg (ref 26.6–33.0)
MCHC: 32.1 g/dL (ref 31.5–35.7)
MCV: 97 fL (ref 79–97)
Monocytes Absolute: 0.6 10*3/uL (ref 0.1–0.9)
Monocytes: 8 %
Neutrophils Absolute: 5.6 10*3/uL (ref 1.4–7.0)
Neutrophils: 69 %
Platelets: 234 10*3/uL (ref 150–450)
RBC: 3.96 x10E6/uL (ref 3.77–5.28)
RDW: 12.4 % (ref 11.7–15.4)
Rheumatoid fact SerPl-aCnc: 11.7 IU/mL (ref <14.0)
Sed Rate: 16 mm/hr (ref 0–40)
Uric Acid: 7.3 mg/dL (ref 3.1–7.9)
WBC: 8 10*3/uL (ref 3.4–10.8)

## 2022-08-16 LAB — THYROID PANEL WITH TSH
Free Thyroxine Index: 2.3 (ref 1.2–4.9)
T3 Uptake Ratio: 30 % (ref 24–39)
T4, Total: 7.5 ug/dL (ref 4.5–12.0)
TSH: 4.61 u[IU]/mL — ABNORMAL HIGH (ref 0.450–4.500)

## 2022-08-16 LAB — VITAMIN D 25 HYDROXY (VIT D DEFICIENCY, FRACTURES): Vit D, 25-Hydroxy: 59.4 ng/mL (ref 30.0–100.0)

## 2022-08-16 LAB — VITAMIN B12: Vitamin B-12: 525 pg/mL (ref 232–1245)

## 2022-09-09 DIAGNOSIS — Z1272 Encounter for screening for malignant neoplasm of vagina: Secondary | ICD-10-CM | POA: Diagnosis not present

## 2022-09-09 DIAGNOSIS — Z6828 Body mass index (BMI) 28.0-28.9, adult: Secondary | ICD-10-CM | POA: Diagnosis not present

## 2022-09-09 DIAGNOSIS — Z124 Encounter for screening for malignant neoplasm of cervix: Secondary | ICD-10-CM | POA: Diagnosis not present

## 2022-09-15 ENCOUNTER — Telehealth: Payer: Self-pay | Admitting: Nurse Practitioner

## 2022-09-15 NOTE — Telephone Encounter (Signed)
Darlene Gregory scheduled for their annual wellness visit. Appointment made for 12/28/2022.  Thank you,  Judeth Cornfield,  AMB Clinical Support [facility] Direct Dial ??[phone]

## 2022-10-11 DIAGNOSIS — H2513 Age-related nuclear cataract, bilateral: Secondary | ICD-10-CM | POA: Diagnosis not present

## 2022-10-11 DIAGNOSIS — H40033 Anatomical narrow angle, bilateral: Secondary | ICD-10-CM | POA: Diagnosis not present

## 2022-11-04 DIAGNOSIS — Z1231 Encounter for screening mammogram for malignant neoplasm of breast: Secondary | ICD-10-CM | POA: Diagnosis not present

## 2022-11-10 DIAGNOSIS — R92321 Mammographic fibroglandular density, right breast: Secondary | ICD-10-CM | POA: Diagnosis not present

## 2022-11-10 DIAGNOSIS — R921 Mammographic calcification found on diagnostic imaging of breast: Secondary | ICD-10-CM | POA: Diagnosis not present

## 2022-11-10 DIAGNOSIS — R928 Other abnormal and inconclusive findings on diagnostic imaging of breast: Secondary | ICD-10-CM | POA: Diagnosis not present

## 2022-11-20 DIAGNOSIS — H04123 Dry eye syndrome of bilateral lacrimal glands: Secondary | ICD-10-CM | POA: Diagnosis not present

## 2022-12-30 ENCOUNTER — Ambulatory Visit (INDEPENDENT_AMBULATORY_CARE_PROVIDER_SITE_OTHER): Payer: Medicare PPO

## 2022-12-30 VITALS — Ht 64.0 in | Wt 155.0 lb

## 2022-12-30 DIAGNOSIS — Z0000 Encounter for general adult medical examination without abnormal findings: Secondary | ICD-10-CM | POA: Diagnosis not present

## 2022-12-30 NOTE — Progress Notes (Signed)
Subjective:   Darlene Gregory is a 89 y.o. female who presents for Medicare Annual (Subsequent) preventive examination.  Visit Complete: Virtual  I connected with  Darlene Gregory on 12/30/22 by a audio enabled telemedicine application and verified that I am speaking with the correct person using two identifiers.  Patient Location: Home  Provider Location: Home Office  I discussed the limitations of evaluation and management by telemedicine. The patient expressed understanding and agreed to proceed.  Patient Medicare AWV questionnaire was completed by the patient on 12/30/2022; I have confirmed that all information answered by patient is correct and no changes since this date.  Review of Systems    Vital Signs: Unable to obtain new vitals due to this being a telehealth visit.  Cardiac Risk Factors include: advanced age (>44men, >44 women);dyslipidemia;hypertension     Objective:    Today's Vitals   12/30/22 0802  Weight: 155 lb (70.3 kg)  Height: 5\' 4"  (1.626 m)   Body mass index is 26.61 kg/m.     12/30/2022    8:06 AM 02/24/2022   10:11 AM 12/28/2021    8:22 AM 07/27/2021   11:21 AM 12/25/2020    8:43 AM 12/24/2019    8:38 AM 12/13/2018    8:34 AM  Advanced Directives  Does Patient Have a Medical Advance Directive? Yes Yes Yes Yes Yes No No  Type of Estate agent of [place];Living will Healthcare Power of [place];Living will Healthcare Power of [place];Living will Living will Healthcare Power of [place];Living will    Copy of Healthcare Power of Attorney in Chart? No - copy requested  No - copy requested  No - copy requested    Would patient like information on creating a medical advance directive?    No - Patient declined  No - Patient declined Yes (MAU/Ambulatory/Procedural Areas - Information given)    Current Medications (verified) Outpatient Encounter Medications as of 12/30/2022  Medication Sig   ALPRAZolam (XANAX) 0.25 MG tablet Take 1 tablet  (0.25 mg total) by mouth 2 (two) times daily as needed for anxiety.   busPIRone (BUSPAR) 5 MG tablet Take 1 tablet (5 mg total) by mouth daily as needed.   cloNIDine (CATAPRES) 0.3 MG tablet Take 1 tablet (0.3 mg total) by mouth 2 (two) times daily.   Ferrous Sulfate (SLOW FE) 142 (45 Fe) MG TBCR Take 1 tablet (142 mg total) by mouth daily.   FIBER PO Take 2 each by mouth daily.    fish oil-omega-3 fatty acids 1000 MG capsule Take 1 g by mouth daily.   furosemide (LASIX) 20 MG tablet Take 1 tablet (20 mg total) by mouth daily.   levothyroxine (EUTHYROX) 88 MCG tablet Take 1 tablet (88 mcg total) by mouth daily.   Multiple Vitamins-Minerals (PRESERVISION AREDS PO) Take by mouth.   olmesartan (BENICAR) 40 MG tablet Take 1 tablet (40 mg total) by mouth daily.   omeprazole (PRILOSEC) 20 MG capsule Take 1 capsule (20 mg total) by mouth daily.   vitamin C (ASCORBIC ACID) 500 MG tablet Take 500 mg by mouth daily.   No facility-administered encounter medications on file as of 12/30/2022.    Allergies (verified) Hyoscyamine sulfate, Norvasc [amlodipine besylate], Pyridium  [phenazopyridine hcl], Phenazopyridine hcl, and Sulfonamide derivatives   History: Past Medical History:  Diagnosis Date   Cancer (HCC) 10/2016   left breast cancer   Difficult intubation    "small trachea"; 01/18/05: glidescope but unable to pass stylet -->fast track LMA with  blind passage for ETT, consider awake intubation; 08/14/08: IV induction and DL with glidesecope for ETT   DJD (degenerative joint disease)    Frequent urination    GERD (gastroesophageal reflux disease)    Hypertension    Hypothyroidism    Osteoporosis    PONV (postoperative nausea and vomiting)    Thyroid disease    Past Surgical History:  Procedure Laterality Date   ABDOMINAL HYSTERECTOMY     BREAST LUMPECTOMY WITH RADIOACTIVE SEED LOCALIZATION Left 10/29/2016   Procedure: LEFT BREAST LUMPECTOMY WITH RADIOACTIVE SEED LOCALIZATION;  Surgeon:  Glenna Fellows, MD;  Location: [facility];  Service: General;  Laterality: Left;   CAROTID BODY TUMOR EXCISION     COLONOSCOPY W/ POLYPECTOMY     EYE SURGERY Bilateral    cataract removed   JOINT REPLACEMENT Right 2006   knee   MASTECTOMY Left 2018   PAROTIDECTOMY Left    RE-EXCISION OF BREAST LUMPECTOMY Left 11/05/2016   Procedure: RE-EXCISION OF LEFT BREAST LUMPECTOMY;  Surgeon: Glenna Fellows, MD;  Location: [facility];  Service: General;  Laterality: Left;   TOTAL KNEE ARTHROPLASTY Left 03/13/2014   Procedure: LEFT TOTAL KNEE ARTHROPLASTY;  Surgeon: Nestor Lewandowsky, MD;  Location: [facility];  Service: Orthopedics;  Laterality: Left;   TOTAL MASTECTOMY Left 11/12/2016   Procedure: LEFT TOTAL MASTECTOMY;  Surgeon: Glenna Fellows, MD;  Location: [facility];  Service: General;  Laterality: Left;   Family History  Problem Relation Age of Onset   Heart disease Mother    Emphysema Father    Cancer Sister        ovarian   Social History   Socioeconomic History   Marital status: Widowed    Spouse name: Chrissie Noa   Number of children: 2   Years of education: 12   Highest education level: 12th grade  Occupational History   Occupation: Architectural technologist    Comment: Retired  Tobacco Use   Smoking status: Former   Smokeless tobacco: Never  Advertising account planner   Vaping status: Never Used  Substance and Sexual Activity   Alcohol use: No   Drug use: No   Sexual activity: Not Currently  Other Topics Concern   Not on file  Social History Narrative   2 sons, [place]   Her husband passed 2023   Social Determinants of Health   Financial Resource Strain: Low Risk  (12/30/2022)   Overall Financial Resource Strain (CARDIA)    Difficulty of Paying Living Expenses: Not hard at all  Food Insecurity: No Food Insecurity (12/30/2022)   Hunger Vital Sign    Worried About Running Out of Food in the Last Year: Never true     Ran Out of Food in the Last Year: Never true  Transportation Needs: No Transportation Needs (12/30/2022)   PRAPARE - Administrator, Civil Service (Medical): No    Lack of Transportation (Non-Medical): No  Physical Activity: Insufficiently Active (12/30/2022)   Exercise Vital Sign    Days of Exercise per Week: 3 days    Minutes of Exercise per Session: 30 min  Stress: No Stress Concern Present (12/30/2022)   [employer] of Occupational Health - Occupational Stress Questionnaire    Feeling of Stress : Not at all  Social Connections: Moderately Isolated (12/30/2022)   Social Connection and Isolation Panel [NHANES]    Frequency of Communication with Friends and Family: More than three times a week  Frequency of Social Gatherings with Friends and Family: More than three times a week    Attends Religious Services: More than 4 times per year    Active Member of Clubs or Organizations: No    Attends Banker Meetings: Never    Marital Status: Widowed    Tobacco Counseling Counseling given: Not Answered   Clinical Intake:  Pre-visit preparation completed: Yes  Pain : No/denies pain     Nutritional Risks: None Diabetes: No  How often do you need to have someone help you when you read instructions, pamphlets, or other written materials from your doctor or pharmacy?: 1 - Never  Interpreter Needed?: No  Information entered by :: Renie Ora, LPN   Activities of Daily Living    12/30/2022    8:06 AM  In your present state of health, do you have any difficulty performing the following activities:  Hearing? 0  Vision? 0  Difficulty concentrating or making decisions? 0  Walking or climbing stairs? 0  Dressing or bathing? 0  Doing errands, shopping? 0  Preparing Food and eating ? N  Using the Toilet? N  In the past six months, have you accidently leaked urine? N  Do you have problems with loss of bowel control? N  Managing your Medications? N   Managing your Finances? N  Housekeeping or managing your Housekeeping? N    Patient Care Team: Bennie Pierini, FNP as PCP - General (Nurse Practitioner) Glenna Fellows, MD (Inactive) as Consulting Physician (General Surgery) Magrinat, Valentino Hue, MD (Inactive) as Consulting Physician (Oncology) Lonie Peak, MD as Attending Physician (Radiation Oncology) Richardean Chimera, MD as Consulting Physician (Obstetrics and Gynecology) Adam Phenix, DPM as Consulting Physician (Podiatry)  Indicate any recent Medical Services you may have received from other than [facility] providers in the past year (date may be approximate).     Assessment:   This is a routine wellness examination for Cleva.  Hearing/Vision screen Vision Screening - Comments:: Wears rx glasses - up to date with routine eye exams with  Dr.Le   Dietary issues and exercise activities discussed:     Goals Addressed             This Visit's Progress    Prevent falls   On track      Depression Screen    12/30/2022    8:05 AM 08/12/2022    9:59 AM 02/04/2022   10:47 AM 12/28/2021    8:20 AM 08/04/2021   11:05 AM 12/25/2020    8:31 AM 08/27/2020    8:46 AM  PHQ 2/9 Scores  PHQ - 2 Score 0 0  0 0 0 0  PHQ- 9 Score  0   0    Exception Documentation   Patient refusal        Fall Risk    12/30/2022    8:03 AM 08/12/2022    9:59 AM 02/04/2022   10:47 AM 12/28/2021    8:17 AM 08/04/2021   11:05 AM  Fall Risk   Falls in the past year? 0 0 0 0 0  Number falls in past yr: 0   0   Injury with Fall? 0   0   Risk for fall due to : No Fall Risks   Orthopedic patient   Follow up Falls prevention discussed   Falls prevention discussed     MEDICARE RISK AT HOME: Medicare Risk at Home Any stairs in or around the home?: Yes If so, are there any  without handrails?: No Home free of loose throw rugs in walkways, pet beds, electrical cords, etc?: Yes Adequate lighting in your home to reduce risk of falls?: Yes Life alert?:  No Use of a cane, walker or w/c?: No Grab bars in the bathroom?: Yes Shower chair or bench in shower?: Yes Elevated toilet seat or a handicapped toilet?: Yes  TIMED UP AND GO:  Was the test performed?  No    Cognitive Function:    10/17/2017    9:08 AM  MMSE - Mini Mental State Exam  Orientation to time 4  Orientation to Place 5  Registration 3  Attention/ Calculation 4  Recall 2  Language- name 2 objects 2  Language- repeat 1  Language- follow 3 step command 3  Language- read & follow direction 1  Write a sentence 1  Copy design 1  Total score 27        12/30/2022    8:07 AM 12/28/2021    8:23 AM 12/25/2020    8:34 AM 12/24/2019    8:43 AM 12/13/2018    8:35 AM  6CIT Screen  What Year? 0 points 0 points 0 points 0 points 0 points  What month? 0 points 0 points 0 points 0 points 0 points  What time? 0 points 0 points 0 points 0 points 0 points  Count back from 20 0 points 0 points 0 points 0 points 0 points  Months in reverse 0 points 0 points 0 points 2 points 0 points  Repeat phrase 0 points 0 points 2 points 4 points 0 points  Total Score 0 points 0 points 2 points 6 points 0 points    Immunizations Immunization History  Administered Date(s) Administered   Moderna Sars-Covid-2 Vaccination 06/02/2019, 07/05/2019   Pneumococcal Conjugate-13 02/13/2014   Pneumococcal Polysaccharide-23 03/31/2010   Tdap 11/05/2013   Zoster Recombinant(Shingrix) 09/28/2016    TDAP status: Up to date  Flu Vaccine status: Declined, Education has been provided regarding the importance of this vaccine but patient still declined. Advised may receive this vaccine at local pharmacy or Health Dept. Aware to provide a copy of the vaccination record if obtained from local pharmacy or Health Dept. Verbalized acceptance and understanding.  Pneumococcal vaccine status: Up to date  Covid-19 vaccine status: Declined, Education has been provided regarding the importance of this vaccine but  patient still declined. Advised may receive this vaccine at local pharmacy or Health Dept.or vaccine clinic. Aware to provide a copy of the vaccination record if obtained from local pharmacy or Health Dept. Verbalized acceptance and understanding.  Qualifies for Shingles Vaccine? Yes   Zostavax completed No   Shingrix Completed?: No.    Education has been provided regarding the importance of this vaccine. Patient has been advised to call insurance company to determine out of pocket expense if they have not yet received this vaccine. Advised may also receive vaccine at local pharmacy or Health Dept. Verbalized acceptance and understanding.  Screening Tests Health Maintenance  Topic Date Due   COVID-19 Vaccine (3 - Moderna risk series) 08/02/2019   INFLUENZA VACCINE  12/09/2022   Zoster Vaccines- Shingrix (2 of 2) 04/01/2023 (Originally 11/23/2016)   DTaP/Tdap/Td (2 - Td or Tdap) 11/06/2023   Medicare Annual Wellness (AWV)  12/30/2023   Pneumonia Vaccine 22+ Years old  Completed   DEXA SCAN  Completed   HPV VACCINES  Aged Out   MAMMOGRAM  Discontinued   Colonoscopy  Discontinued    Health Maintenance  Health Maintenance  Due  Topic Date Due   COVID-19 Vaccine (3 - Moderna risk series) 08/02/2019   INFLUENZA VACCINE  12/09/2022    Colorectal cancer screening: No longer required.   Mammogram status: No longer required due to age.  Bone Density status: Ordered declined . Pt provided with contact info and advised to call to schedule appt.  Lung Cancer Screening: (Low Dose CT Chest recommended if Age 13-80 years, 20 pack-year currently smoking OR have quit w/in 15years.) does not qualify.   Lung Cancer Screening Referral: n/a  Additional Screening:  Hepatitis C Screening: does not qualify;   Vision Screening: Recommended annual ophthalmology exams for early detection of glaucoma and other disorders of the eye. Is the patient up to date with their annual eye exam?  Yes  Who is the  provider or what is the name of the office in which the patient attends annual eye exams? Dr.Le  If pt is not established with a provider, would they like to be referred to a provider to establish care? No .   Dental Screening: Recommended annual dental exams for proper oral hygiene    Community Resource Referral / Chronic Care Management: CRR required this visit?  No   CCM required this visit?  No     Plan:     I have personally reviewed and noted the following in the patient's chart:   Medical and social history Use of alcohol, tobacco or illicit drugs  Current medications and supplements including opioid prescriptions. Patient is not currently taking opioid prescriptions. Functional ability and status Nutritional status Physical activity Advanced directives List of other physicians Hospitalizations, surgeries, and ER visits in previous 12 months Vitals Screenings to include cognitive, depression, and falls Referrals and appointments  In addition, I have reviewed and discussed with patient certain preventive protocols, quality metrics, and best practice recommendations. A written personalized care plan for preventive services as well as general preventive health recommendations were provided to patient.     Lorrene Reid, LPN   1/61/0960   After Visit Summary: (MyChart) Due to this being a telephonic visit, the after visit summary with patients personalized plan was offered to patient via MyChart   Nurse Notes: none

## 2022-12-30 NOTE — Patient Instructions (Signed)
Darlene Gregory , Thank you for taking time to come for your Medicare Wellness Visit. I appreciate your ongoing commitment to your health goals. Please review the following plan we discussed and let me know if I can assist you in the future.   Referrals/Orders/Follow-Ups/Clinician Recommendations: Aim for 30 minutes of exercise or brisk walking, 6-8 glasses of water, and 5 servings of fruits and vegetables each day.   This is a list of the screening recommended for you and due dates:  Health Maintenance  Topic Date Due   COVID-19 Vaccine (3 - Moderna risk series) 08/02/2019   Flu Shot  12/09/2022   Zoster (Shingles) Vaccine (2 of 2) 04/01/2023*   DTaP/Tdap/Td vaccine (2 - Td or Tdap) 11/06/2023   Medicare Annual Wellness Visit  12/30/2023   Pneumonia Vaccine  Completed   DEXA scan (bone density measurement)  Completed   HPV Vaccine  Aged Out   Mammogram  Discontinued   Colon Cancer Screening  Discontinued  *Topic was postponed. The date shown is not the original due date.    Advanced directives: (Copy Requested) Please bring a copy of your health care power of attorney and living will to the office to be added to your chart at your convenience.  Next Medicare Annual Wellness Visit scheduled for next year: Yes  Insert Preventive Care attachment Insert FALL PREVENTION attachment if needed

## 2023-01-06 ENCOUNTER — Telehealth (INDEPENDENT_AMBULATORY_CARE_PROVIDER_SITE_OTHER): Payer: Medicare PPO | Admitting: Family

## 2023-01-06 ENCOUNTER — Encounter: Payer: Self-pay | Admitting: Family

## 2023-01-06 DIAGNOSIS — R399 Unspecified symptoms and signs involving the genitourinary system: Secondary | ICD-10-CM | POA: Diagnosis not present

## 2023-01-06 LAB — URINALYSIS, ROUTINE W REFLEX MICROSCOPIC
Bilirubin, UA: NEGATIVE
Glucose, UA: NEGATIVE
Ketones, UA: NEGATIVE
Nitrite, UA: NEGATIVE
Protein,UA: NEGATIVE
RBC, UA: NEGATIVE
Specific Gravity, UA: 1.015 (ref 1.005–1.030)
Urobilinogen, Ur: 0.2 mg/dL (ref 0.2–1.0)
pH, UA: 6 (ref 5.0–7.5)

## 2023-01-06 LAB — MICROSCOPIC EXAMINATION
RBC, Urine: NONE SEEN /hpf (ref 0–2)
Renal Epithel, UA: NONE SEEN /hpf
WBC, UA: NONE SEEN /hpf (ref 0–5)
Yeast, UA: NONE SEEN

## 2023-01-06 MED ORDER — CEPHALEXIN 500 MG PO CAPS
500.0000 mg | ORAL_CAPSULE | Freq: Two times a day (BID) | ORAL | 0 refills | Status: DC
Start: 2023-01-06 — End: 2023-02-14

## 2023-01-06 NOTE — Progress Notes (Signed)
Virtual Visit Consent   Darlene Gregory, you are scheduled for a virtual visit with a [facility] provider today. Just as with appointments in the office, your consent must be obtained to participate. Your consent will be active for this visit and any virtual visit you may have with one of our providers in the next 365 days. If you have a MyChart account, a copy of this consent can be sent to you electronically.  As this is a virtual visit, video technology does not allow for your provider to perform a traditional examination. This may limit your provider's ability to fully assess your condition. If your provider identifies any concerns that need to be evaluated in person or the need to arrange testing (such as labs, EKG, etc.), we will make arrangements to do so. Although advances in technology are sophisticated, we cannot ensure that it will always work on either your end or our end. If the connection with a video visit is poor, the visit may have to be switched to a telephone visit. With either a video or telephone visit, we are not always able to ensure that we have a secure connection.  By engaging in this virtual visit, you consent to the provision of healthcare and authorize for your insurance to be billed (if applicable) for the services provided during this visit. Depending on your insurance coverage, you may receive a charge related to this service.  I need to obtain your verbal consent now. Are you willing to proceed with your visit today? Darlene Gregory has provided verbal consent on 01/06/2023 for a virtual visit (video or telephone). Jannifer Rodney, FNP  Date: 01/06/2023 4:04 PM  Virtual Visit via Video Note   I, Jannifer Rodney, connected with  Darlene Gregory  ([id number], 03/05/1934) on 01/06/23 at  5:30 PM EDT by a video-enabled telemedicine application and verified that I am speaking with the correct person using two identifiers.  Location: Patient: Virtual Visit Location Patient:  Home Provider: Virtual Visit Location Provider: Home Office   I discussed the limitations of evaluation and management by telemedicine and the availability of in person appointments. The patient expressed understanding and agreed to proceed.    History of Present Illness: Darlene Gregory is a 89 y.o. who identifies as a female who was assigned female at birth, and is being seen today for UTI symptoms .  HPI: Dysuria  This is a new problem. The current episode started 1 to 4 weeks ago. The problem occurs intermittently. The problem has been waxing and waning. The quality of the pain is described as aching. The pain is at a severity of 6/10. The pain is mild. Associated symptoms include flank pain, frequency, hesitancy and urgency. Pertinent negatives include no hematuria, nausea or vomiting. Associated symptoms comments: Dark urine . She has tried increased fluids for the symptoms. The treatment provided mild relief.    Problems:  Patient Active Problem List   Diagnosis Date Noted   Peripheral edema 03/02/2021   CKD (chronic kidney disease), stage III (HCC) 09/26/2017   Gout 08/23/2017   Malignant neoplasm of upper-outer quadrant of left breast in female, estrogen receptor positive (HCC) 09/30/2016   GAD (generalized anxiety disorder) 07/24/2015   BMI 32.0-32.9,adult 12/10/2014   Arthritis of knee 03/13/2014   Hypothyroidism 02/13/2014   Essential hypertension, benign 08/24/2012   Hemorrhoids 12/18/2007   Iron deficiency anemia 11/22/2007   GERD 11/22/2007   Diaphragmatic hernia 11/22/2007   Diverticulosis of colon 11/22/2007  POSTMENOPAUSAL STATUS 11/22/2007   Osteoporosis 11/22/2007   COLONIC POLYPS, ADENOMATOUS, HX OF 11/22/2007   SCHATZKI'S RING, HX OF 11/22/2007    Allergies:  Allergies  Allergen Reactions   Hyoscyamine Sulfate     REACTION: rash   Norvasc [Amlodipine Besylate] Swelling   Pyridium  [Phenazopyridine Hcl]    Phenazopyridine Hcl Rash   Sulfonamide Derivatives  Rash   Medications:  Current Outpatient Medications:    ALPRAZolam (XANAX) 0.25 MG tablet, Take 1 tablet (0.25 mg total) by mouth 2 (two) times daily as needed for anxiety., Disp: 60 tablet, Rfl: 5   busPIRone (BUSPAR) 5 MG tablet, Take 1 tablet (5 mg total) by mouth daily as needed., Disp: 30 tablet, Rfl: 5   cloNIDine (CATAPRES) 0.3 MG tablet, Take 1 tablet (0.3 mg total) by mouth 2 (two) times daily., Disp: 180 tablet, Rfl: 1   Ferrous Sulfate (SLOW FE) 142 (45 Fe) MG TBCR, Take 1 tablet (142 mg total) by mouth daily., Disp: 90 tablet, Rfl: 1   FIBER PO, Take 2 each by mouth daily. , Disp: , Rfl:    fish oil-omega-3 fatty acids 1000 MG capsule, Take 1 g by mouth daily., Disp: , Rfl:    furosemide (LASIX) 20 MG tablet, Take 1 tablet (20 mg total) by mouth daily., Disp: 90 tablet, Rfl: 1   levothyroxine (EUTHYROX) 88 MCG tablet, Take 1 tablet (88 mcg total) by mouth daily., Disp: 90 tablet, Rfl: 1   Multiple Vitamins-Minerals (PRESERVISION AREDS PO), Take by mouth., Disp: , Rfl:    olmesartan (BENICAR) 40 MG tablet, Take 1 tablet (40 mg total) by mouth daily., Disp: 90 tablet, Rfl: 1   omeprazole (PRILOSEC) 20 MG capsule, Take 1 capsule (20 mg total) by mouth daily., Disp: 90 capsule, Rfl: 1   vitamin C (ASCORBIC ACID) 500 MG tablet, Take 500 mg by mouth daily., Disp: , Rfl:   Observations/Objective: Patient is well-developed, well-nourished in no acute distress.  Resting comfortably  at home.  Head is normocephalic, atraumatic.  No labored breathing. Speech is clear and coherent with logical content.  Patient is alert and oriented at baseline.    Assessment and Plan: 1. UTI symptoms - Urinalysis, Routine w reflex microscopic - Urine Culture  Force fluids AZO over the counter X2 days Follow up if symptoms worsen or do not improve Culture pending   Follow Up Instructions: I discussed the assessment and treatment plan with the patient. The patient was provided an opportunity to  ask questions and all were answered. The patient agreed with the plan and demonstrated an understanding of the instructions.  A copy of instructions were sent to the patient via MyChart unless otherwise noted below.     The patient was advised to call back or seek an in-person evaluation if the symptoms worsen or if the condition fails to improve as anticipated.  Time:  I spent 9 minutes with the patient via telehealth technology discussing the above problems/concerns.    Jannifer Rodney, FNP

## 2023-01-08 LAB — URINE CULTURE

## 2023-02-14 ENCOUNTER — Encounter: Payer: Self-pay | Admitting: Nurse Practitioner

## 2023-02-14 ENCOUNTER — Ambulatory Visit: Payer: Medicare PPO | Admitting: Nurse Practitioner

## 2023-02-14 VITALS — BP 158/76 | HR 50 | Temp 97.7°F | Resp 20 | Ht 64.0 in | Wt 154.0 lb

## 2023-02-14 DIAGNOSIS — K219 Gastro-esophageal reflux disease without esophagitis: Secondary | ICD-10-CM | POA: Diagnosis not present

## 2023-02-14 DIAGNOSIS — I10 Essential (primary) hypertension: Secondary | ICD-10-CM

## 2023-02-14 DIAGNOSIS — E034 Atrophy of thyroid (acquired): Secondary | ICD-10-CM | POA: Diagnosis not present

## 2023-02-14 DIAGNOSIS — R600 Localized edema: Secondary | ICD-10-CM | POA: Diagnosis not present

## 2023-02-14 DIAGNOSIS — K5730 Diverticulosis of large intestine without perforation or abscess without bleeding: Secondary | ICD-10-CM

## 2023-02-14 DIAGNOSIS — N1832 Chronic kidney disease, stage 3b: Secondary | ICD-10-CM

## 2023-02-14 DIAGNOSIS — D509 Iron deficiency anemia, unspecified: Secondary | ICD-10-CM

## 2023-02-14 DIAGNOSIS — F411 Generalized anxiety disorder: Secondary | ICD-10-CM

## 2023-02-14 DIAGNOSIS — R300 Dysuria: Secondary | ICD-10-CM

## 2023-02-14 DIAGNOSIS — M1009 Idiopathic gout, multiple sites: Secondary | ICD-10-CM | POA: Diagnosis not present

## 2023-02-14 DIAGNOSIS — M810 Age-related osteoporosis without current pathological fracture: Secondary | ICD-10-CM

## 2023-02-14 DIAGNOSIS — R5382 Chronic fatigue, unspecified: Secondary | ICD-10-CM

## 2023-02-14 DIAGNOSIS — Z6832 Body mass index (BMI) 32.0-32.9, adult: Secondary | ICD-10-CM

## 2023-02-14 LAB — MICROSCOPIC EXAMINATION: RBC, Urine: NONE SEEN /[HPF] (ref 0–2)

## 2023-02-14 LAB — URINALYSIS, COMPLETE
Bilirubin, UA: NEGATIVE
Glucose, UA: NEGATIVE
Ketones, UA: NEGATIVE
Leukocytes,UA: NEGATIVE
Nitrite, UA: NEGATIVE
Protein,UA: NEGATIVE
RBC, UA: NEGATIVE
Specific Gravity, UA: 1.01 (ref 1.005–1.030)
Urobilinogen, Ur: 0.2 mg/dL (ref 0.2–1.0)
pH, UA: 5.5 (ref 5.0–7.5)

## 2023-02-14 MED ORDER — LEVOTHYROXINE SODIUM 88 MCG PO TABS: 88.0000 ug | ORAL_TABLET | Freq: Every day | ORAL | 1 refills | Status: AC

## 2023-02-14 MED ORDER — OMEPRAZOLE 20 MG PO CPDR: 20.0000 mg | DELAYED_RELEASE_CAPSULE | Freq: Every day | ORAL | 1 refills | Status: AC

## 2023-02-14 MED ORDER — CLONIDINE HCL 0.3 MG PO TABS: 0.3000 mg | ORAL_TABLET | Freq: Two times a day (BID) | ORAL | 1 refills | Status: AC

## 2023-02-14 MED ORDER — OLMESARTAN MEDOXOMIL 40 MG PO TABS
40.0000 mg | ORAL_TABLET | Freq: Every day | ORAL | 1 refills | Status: AC
Start: 2023-02-14 — End: ?

## 2023-02-14 MED ORDER — FUROSEMIDE 20 MG PO TABS: 20.0000 mg | ORAL_TABLET | Freq: Every day | ORAL | 1 refills | Status: AC

## 2023-02-14 MED ORDER — BUSPIRONE HCL 5 MG PO TABS: 5.0000 mg | ORAL_TABLET | Freq: Every day | ORAL | 5 refills | Status: AC | PRN

## 2023-02-14 MED ORDER — ALPRAZOLAM 0.25 MG PO TABS: 0.2500 mg | ORAL_TABLET | Freq: Two times a day (BID) | ORAL | 5 refills | Status: AC | PRN

## 2023-02-14 NOTE — Patient Instructions (Signed)
Fall Prevention in the Home, Adult Falls can cause injuries and can happen to people of all ages. There are many things you can do to make your home safer and to help prevent falls. What actions can I take to prevent falls? General information Use good lighting in all rooms. Make sure to: Replace any light bulbs that burn out. Turn on the lights in dark areas and use night-lights. Keep items that you use often in easy-to-reach places. Lower the shelves around your home if needed. Move furniture so that there are clear paths around it. Do not use throw rugs or other things on the floor that can make you trip. If any of your floors are uneven, fix them. Add color or contrast paint or tape to clearly mark and help you see: Grab bars or handrails. First and last steps of staircases. Where the edge of each step is. If you use a ladder or stepladder: Make sure that it is fully opened. Do not climb a closed ladder. Make sure the sides of the ladder are locked in place. Have someone hold the ladder while you use it. Know where your pets are as you move through your home. What can I do in the bathroom?     Keep the floor dry. Clean up any water on the floor right away. Remove soap buildup in the bathtub or shower. Buildup makes bathtubs and showers slippery. Use non-skid mats or decals on the floor of the bathtub or shower. Attach bath mats securely with double-sided, non-slip rug tape. If you need to sit down in the shower, use a non-slip stool. Install grab bars by the toilet and in the bathtub and shower. Do not use towel bars as grab bars. What can I do in the bedroom? Make sure that you have a light by your bed that is easy to reach. Do not use any sheets or blankets on your bed that hang to the floor. Have a firm chair or bench with side arms that you can use for support when you get dressed. What can I do in the kitchen? Clean up any spills right away. If you need to reach something  above you, use a step stool with a grab bar. Keep electrical cords out of the way. Do not use floor polish or wax that makes floors slippery. What can I do with my stairs? Do not leave anything on the stairs. Make sure that you have a light switch at the top and the bottom of the stairs. Make sure that there are handrails on both sides of the stairs. Fix handrails that are broken or loose. Install non-slip stair treads on all your stairs if they do not have carpet. Avoid having throw rugs at the top or bottom of the stairs. Choose a carpet that does not hide the edge of the steps on the stairs. Make sure that the carpet is firmly attached to the stairs. Fix carpet that is loose or worn. What can I do on the outside of my home? Use bright outdoor lighting. Fix the edges of walkways and driveways and fix any cracks. Clear paths of anything that can make you trip, such as tools or rocks. Add color or contrast paint or tape to clearly mark and help you see anything that might make you trip as you walk through a door, such as a raised step or threshold. Trim any bushes or trees on paths to your home. Check to see if handrails are loose   or broken and that both sides of all steps have handrails. Install guardrails along the edges of any raised decks and porches. Have leaves, snow, or ice cleared regularly. Use sand, salt, or ice melter on paths if you live where there is ice and snow during the winter. Clean up any spills in your garage right away. This includes grease or oil spills. What other actions can I take? Review your medicines with your doctor. Some medicines can cause dizziness or changes in blood pressure, which increase your risk of falling. Wear shoes that: Have a low heel. Do not wear high heels. Have rubber bottoms and are closed at the toe. Feel good on your feet and fit well. Use tools that help you move around if needed. These include: Canes. Walkers. Scooters. Crutches. Ask  your doctor what else you can do to help prevent falls. This may include seeing a physical therapist to learn to do exercises to move better and get stronger. Where to find more information Centers for Disease Control and Prevention, [employer]: cdc.gov National Institute on Aging: nia.nih.gov National Institute on Aging: nia.nih.gov Contact a doctor if: You are afraid of falling at home. You feel weak, drowsy, or dizzy at home. You fall at home. Get help right away if you: Lose consciousness or have trouble moving after a fall. Have a fall that causes a head injury. These symptoms may be an emergency. Get help right away. Call 911. Do not wait to see if the symptoms will go away. Do not drive yourself to the hospital. This information is not intended to replace advice given to you by your health care provider. Make sure you discuss any questions you have with your health care provider. Document Revised: 12/28/2021 Document Reviewed: 12/28/2021 Elsevier Patient Education  2024 Elsevier Inc.  

## 2023-02-14 NOTE — Progress Notes (Signed)
Subjective:    Patient ID: Darlene Gregory, female    DOB: [birth year], 89 y.o.   MRN: [id number]   Chief Complaint: medical management of chronic issues     HPI:  Darlene Gregory is a 89 y.o. who identifies as a female who was assigned female at birth.   Social history: Lives with: by herself Work history: retired   Water engineer in today for follow up of the following chronic medical issues:  1. Essential hypertension, benign No c/o chest pain, sob or headache. Does not check blood pressure at home. BP Readings from Last 3 Encounters:  08/12/22 136/72  02/24/22 (!) 202/78  02/04/22 (!) 148/82     2. Hypothyroidism due to acquired atrophy of thyroid No issues that she is aware of. Lab Results  Component Value Date   TSH 4.610 (H) 08/12/2022     3. Diverticulosis of colon No recent flare ups  4. Gastroesophageal reflux disease without esophagitis  Is on omeprazole daily and is doing well.  5. Stage 3b chronic kidney disease (HCC) No voiding issues Lab Results  Component Value Date   CREATININE 1.50 (H) 08/12/2022     6. Peripheral edema Has some dialy  7. Acute idiopathic gout of multiple sites No recent floar eups  8. GAD (generalized anxiety disorder) Is on buspar daily- working well for her    02/14/2023   11:51 AM 08/12/2022    9:59 AM 08/04/2021   11:05 AM 08/27/2020    8:46 AM  GAD 7 : Generalized Anxiety Score  Nervous, Anxious, on Edge 0 0 0 0  Control/stop worrying 0 0 0 0  Worry too much - different things 0 0 0 0  Trouble relaxing 0 0 0 0  Restless 0 0 0 0  Easily annoyed or irritable 0 0 0 0  Afraid - awful might happen 0 0 0 0  Total GAD 7 Score 0 0 0 0  Anxiety Difficulty Not difficult at all Not difficult at all Not difficult at all Not difficult at all      9. Age-related osteoporosis without current pathological fracture Last dexascan was in 2014. She doe snot want to repeat.  10. Iron deficiency anemia, unspecified iron deficiency  anemia type No c/o fatigue Lab Results  Component Value Date   HGB 12.3 08/12/2022     11. BMI 32.0-32.9,adult No recent weight changes Wt Readings from Last 3 Encounters:  02/14/23 154 lb (69.9 kg)  12/30/22 155 lb (70.3 kg)  08/12/22 153 lb (69.4 kg)   BMI Readings from Last 3 Encounters:  02/14/23 26.43 kg/m  12/30/22 26.61 kg/m  08/12/22 27.10 kg/m      New complaints: None today  Allergies  Allergen Reactions   Hyoscyamine Sulfate     REACTION: rash   Norvasc [Amlodipine Besylate] Swelling   Pyridium  [Phenazopyridine Hcl]    Phenazopyridine Hcl Rash   Sulfonamide Derivatives Rash   Outpatient Encounter Medications as of 02/14/2023  Medication Sig   ALPRAZolam (XANAX) 0.25 MG tablet Take 1 tablet (0.25 mg total) by mouth 2 (two) times daily as needed for anxiety.   busPIRone (BUSPAR) 5 MG tablet Take 1 tablet (5 mg total) by mouth daily as needed.   cephALEXin (KEFLEX) 500 MG capsule Take 1 capsule (500 mg total) by mouth 2 (two) times daily.   cloNIDine (CATAPRES) 0.3 MG tablet Take 1 tablet (0.3 mg total) by mouth 2 (two) times daily.   Ferrous Sulfate (SLOW FE) 142 (  45 Fe) MG TBCR Take 1 tablet (142 mg total) by mouth daily.   FIBER PO Take 2 each by mouth daily.    fish oil-omega-3 fatty acids 1000 MG capsule Take 1 g by mouth daily.   furosemide (LASIX) 20 MG tablet Take 1 tablet (20 mg total) by mouth daily.   levothyroxine (EUTHYROX) 88 MCG tablet Take 1 tablet (88 mcg total) by mouth daily.   Multiple Vitamins-Minerals (PRESERVISION AREDS PO) Take by mouth.   olmesartan (BENICAR) 40 MG tablet Take 1 tablet (40 mg total) by mouth daily.   omeprazole (PRILOSEC) 20 MG capsule Take 1 capsule (20 mg total) by mouth daily.   vitamin C (ASCORBIC ACID) 500 MG tablet Take 500 mg by mouth daily.   No facility-administered encounter medications on file as of 02/14/2023.    Past Surgical History:  Procedure Laterality Date   ABDOMINAL HYSTERECTOMY     BREAST  LUMPECTOMY WITH RADIOACTIVE SEED LOCALIZATION Left 10/29/2016   Procedure: LEFT BREAST LUMPECTOMY WITH RADIOACTIVE SEED LOCALIZATION;  Surgeon: Glenna Fellows, MD;  Location: [facility];  Service: General;  Laterality: Left;   CAROTID BODY TUMOR EXCISION     COLONOSCOPY W/ POLYPECTOMY     EYE SURGERY Bilateral    cataract removed   JOINT REPLACEMENT Right 2006   knee   MASTECTOMY Left 2018   PAROTIDECTOMY Left    RE-EXCISION OF BREAST LUMPECTOMY Left 11/05/2016   Procedure: RE-EXCISION OF LEFT BREAST LUMPECTOMY;  Surgeon: Glenna Fellows, MD;  Location: [facility];  Service: General;  Laterality: Left;   TOTAL KNEE ARTHROPLASTY Left 03/13/2014   Procedure: LEFT TOTAL KNEE ARTHROPLASTY;  Surgeon: Nestor Lewandowsky, MD;  Location: [facility];  Service: Orthopedics;  Laterality: Left;   TOTAL MASTECTOMY Left 11/12/2016   Procedure: LEFT TOTAL MASTECTOMY;  Surgeon: Glenna Fellows, MD;  Location:  SURGERY CENTER;  Service: General;  Laterality: Left;    Family History  Problem Relation Age of Onset   Heart disease Mother    Emphysema Father    Cancer Sister        ovarian      Controlled substance contract: n/a     Review of Systems  Constitutional:  Negative for diaphoresis.  Eyes:  Negative for pain.  Respiratory:  Negative for shortness of breath.   Cardiovascular:  Negative for chest pain, palpitations and leg swelling.  Gastrointestinal:  Negative for abdominal pain.  Endocrine: Negative for polydipsia.  Skin:  Negative for rash.  Neurological:  Negative for dizziness, weakness and headaches.  Hematological:  Does not bruise/bleed easily.  All other systems reviewed and are negative.      Objective:   Physical Exam Vitals and nursing note reviewed.  Constitutional:      General: She is not in acute distress.    Appearance: Normal appearance. She is well-developed.  HENT:     Head: Normocephalic.     Right Ear: Tympanic  membrane normal.     Left Ear: Tympanic membrane normal.     Nose: Nose normal.     Mouth/Throat:     Mouth: Mucous membranes are moist.  Eyes:     Pupils: Pupils are equal, round, and reactive to light.  Neck:     Vascular: No carotid bruit or JVD.  Cardiovascular:     Rate and Rhythm: Normal rate and regular rhythm.     Heart sounds: Normal heart sounds.  Pulmonary:     Effort: Pulmonary effort is normal.  No respiratory distress.     Breath sounds: Normal breath sounds. No wheezing or rales.  Chest:     Chest wall: No tenderness.  Abdominal:     General: Bowel sounds are normal. There is no distension or abdominal bruit.     Palpations: Abdomen is soft. There is no hepatomegaly, splenomegaly, mass or pulsatile mass.     Tenderness: There is no abdominal tenderness.  Musculoskeletal:        General: Normal range of motion.     Cervical back: Normal range of motion and neck supple.     Right lower leg: No edema.     Left lower leg: No edema.  Lymphadenopathy:     Cervical: No cervical adenopathy.  Skin:    General: Skin is warm and dry.  Neurological:     Mental Status: She is alert and oriented to person, place, and time.     Deep Tendon Reflexes: Reflexes are normal and symmetric.  Psychiatric:        Behavior: Behavior normal.        Thought Content: Thought content normal.        Judgment: Judgment normal.     BP (!) 158/76   Pulse (!) 50   Temp 97.7 F (36.5 C) (Temporal)   Resp 20   Ht 5\' 4"  (1.626 m)   Wt 154 lb (69.9 kg)   SpO2 98%   BMI 26.43 kg/m       Assessment & Plan:   VERNICA JUDAY comes in today with chief complaint of Medical Management of Chronic Issues   Diagnosis and orders addressed:  1. Essential hypertension, benign Low sodium diet - CBC with Differential/Platelet - CMP14+EGFR - Lipid panel - olmesartan (BENICAR) 40 MG tablet; Take 1 tablet (40 mg total) by mouth daily.  Dispense: 90 tablet; Refill: 1  2. Hypothyroidism due to  acquired atrophy of thyroid Labs pending - Thyroid Panel With TSH - levothyroxine (EUTHYROX) 88 MCG tablet; Take 1 tablet (88 mcg total) by mouth daily.  Dispense: 90 tablet; Refill: 1  3. Diverticulosis of colon Watch diet to prevent flare up  4. Gastroesophageal reflux disease without esophagitis Avoid spicy foods Do not eat 2 hours prior to bedtime - omeprazole (PRILOSEC) 20 MG capsule; Take 1 capsule (20 mg total) by mouth daily.  Dispense: 90 capsule; Refill: 1  5. Stage 3b chronic kidney disease (HCC) Labs pending  6. Peripheral edema Elevate legs when sitting - furosemide (LASIX) 20 MG tablet; Take 1 tablet (20 mg total) by mouth daily.  Dispense: 90 tablet; Refill: 1  7. Acute idiopathic gout of multiple sites  8. GAD (generalized anxiety disorder) Stress manaegemnt - ALPRAZolam (XANAX) 0.25 MG tablet; Take 1 tablet (0.25 mg total) by mouth 2 (two) times daily as needed for anxiety.  Dispense: 60 tablet; Refill: 5 - busPIRone (BUSPAR) 5 MG tablet; Take 1 tablet (5 mg total) by mouth daily as needed.  Dispense: 30 tablet; Refill: 5  9. Age-related osteoporosis without current pathological fracture Weight bearing exercises  10. Iron deficiency anemia, unspecified iron deficiency anemia type Labs pending  11. BMI 32.0-32.9,adult Discussed diet and exercise for person with BMI >25 Will recheck weight in 3-6 months   12. Dysuria Urine clear - Urinalysis, Complete - Urine Culture  13. Chronic fatigue Labs pending - Vitamin B12   Labs pending Health Maintenance reviewed Diet and exercise encouraged  Follow up plan: 6 months   Mary-Margaret Daphine Deutscher, FNP

## 2023-02-15 LAB — LIPID PANEL
Cholesterol, Total: 146 mg/dL (ref 100–199)
HDL: 67 mg/dL (ref >39)
LDL CALC COMMENT:: 2.2 ratio (ref 0.0–4.4)
LDL Chol Calc (NIH): 65 mg/dL (ref 0–99)
Triglycerides: 70 mg/dL (ref 0–149)
VLDL Cholesterol Cal: 14 mg/dL (ref 5–40)

## 2023-02-15 LAB — CBC WITH DIFFERENTIAL/PLATELET
Basophils Absolute: 0.1 10*3/uL (ref 0.0–0.2)
Basos: 1 %
EOS (ABSOLUTE): 0.2 10*3/uL (ref 0.0–0.4)
Eos: 3 %
Hematocrit: 39.1 % (ref 34.0–46.6)
Hemoglobin: 12.4 g/dL (ref 11.1–15.9)
Immature Grans (Abs): 0 10*3/uL (ref 0.0–0.1)
Immature Granulocytes: 0 %
Lymphocytes Absolute: 1.5 10*3/uL (ref 0.7–3.1)
Lymphs: 21 %
MCH: 31.4 pg (ref 26.6–33.0)
MCHC: 31.7 g/dL (ref 31.5–35.7)
MCV: 99 fL — ABNORMAL HIGH (ref 79–97)
Monocytes Absolute: 0.6 10*3/uL (ref 0.1–0.9)
Monocytes: 9 %
Neutrophils Absolute: 4.9 10*3/uL (ref 1.4–7.0)
Neutrophils: 66 %
Platelets: 190 10*3/uL (ref 150–450)
RBC: 3.95 x10E6/uL (ref 3.77–5.28)
RDW: 12.5 % (ref 11.7–15.4)
WBC: 7.4 10*3/uL (ref 3.4–10.8)

## 2023-02-15 LAB — CMP14+EGFR
ALT: 12 IU/L (ref 0–32)
AST: 16 IU/L (ref 0–40)
Albumin: 4.1 g/dL (ref 3.7–4.7)
Alkaline Phosphatase: 117 IU/L (ref 44–121)
BUN/Creatinine Ratio: 19 (ref 12–28)
BUN: 28 mg/dL — ABNORMAL HIGH (ref 8–27)
Bilirubin Total: 0.4 mg/dL (ref 0.0–1.2)
CO2: 26 mmol/L (ref 20–29)
Calcium: 9.6 mg/dL (ref 8.7–10.3)
Chloride: 101 mmol/L (ref 96–106)
Creatinine, Ser: 1.51 mg/dL — ABNORMAL HIGH (ref 0.57–1.00)
Globulin, Total: 2.2 g/dL (ref 1.5–4.5)
Glucose: 111 mg/dL — ABNORMAL HIGH (ref 70–99)
Potassium: 4.1 mmol/L (ref 3.5–5.2)
Sodium: 144 mmol/L (ref 134–144)
Total Protein: 6.3 g/dL (ref 6.0–8.5)
eGFR: 33 mL/min/{1.73_m2} — ABNORMAL LOW (ref >59)

## 2023-02-15 LAB — THYROID PANEL WITH TSH
Free Thyroxine Index: 2.3 (ref 1.2–4.9)
T3 Uptake Ratio: 29 % (ref 24–39)
T4, Total: 7.9 ug/dL (ref 4.5–12.0)
TSH: 4.4 u[IU]/mL (ref 0.450–4.500)

## 2023-02-15 LAB — VITAMIN B12: Vitamin B-12: 497 pg/mL (ref 232–1245)

## 2023-02-16 LAB — URINE CULTURE

## 2023-04-15 DIAGNOSIS — H6123 Impacted cerumen, bilateral: Secondary | ICD-10-CM | POA: Diagnosis not present

## 2023-05-25 DIAGNOSIS — K219 Gastro-esophageal reflux disease without esophagitis: Secondary | ICD-10-CM | POA: Diagnosis not present

## 2023-05-25 DIAGNOSIS — G319 Degenerative disease of nervous system, unspecified: Secondary | ICD-10-CM | POA: Diagnosis not present

## 2023-05-25 DIAGNOSIS — N179 Acute kidney failure, unspecified: Secondary | ICD-10-CM | POA: Diagnosis not present

## 2023-05-25 DIAGNOSIS — W19XXXA Unspecified fall, initial encounter: Secondary | ICD-10-CM | POA: Diagnosis not present

## 2023-05-25 DIAGNOSIS — R262 Difficulty in walking, not elsewhere classified: Secondary | ICD-10-CM | POA: Diagnosis not present

## 2023-05-25 DIAGNOSIS — M6282 Rhabdomyolysis: Secondary | ICD-10-CM | POA: Diagnosis not present

## 2023-05-25 DIAGNOSIS — Y92009 Unspecified place in unspecified non-institutional (private) residence as the place of occurrence of the external cause: Secondary | ICD-10-CM | POA: Diagnosis not present

## 2023-05-25 DIAGNOSIS — R531 Weakness: Secondary | ICD-10-CM | POA: Diagnosis not present

## 2023-05-25 DIAGNOSIS — I10 Essential (primary) hypertension: Secondary | ICD-10-CM | POA: Diagnosis not present

## 2023-05-25 DIAGNOSIS — R4182 Altered mental status, unspecified: Secondary | ICD-10-CM | POA: Diagnosis not present

## 2023-05-25 DIAGNOSIS — G459 Transient cerebral ischemic attack, unspecified: Secondary | ICD-10-CM | POA: Diagnosis not present

## 2023-05-25 DIAGNOSIS — I160 Hypertensive urgency: Secondary | ICD-10-CM | POA: Diagnosis not present

## 2023-05-25 DIAGNOSIS — E039 Hypothyroidism, unspecified: Secondary | ICD-10-CM | POA: Diagnosis not present

## 2023-05-25 DIAGNOSIS — I6782 Cerebral ischemia: Secondary | ICD-10-CM | POA: Diagnosis not present

## 2023-05-25 DIAGNOSIS — Y939 Activity, unspecified: Secondary | ICD-10-CM | POA: Diagnosis not present

## 2023-05-26 DIAGNOSIS — E039 Hypothyroidism, unspecified: Secondary | ICD-10-CM | POA: Diagnosis not present

## 2023-05-26 DIAGNOSIS — R531 Weakness: Secondary | ICD-10-CM | POA: Diagnosis not present

## 2023-05-26 DIAGNOSIS — R262 Difficulty in walking, not elsewhere classified: Secondary | ICD-10-CM | POA: Diagnosis not present

## 2023-05-26 DIAGNOSIS — R4182 Altered mental status, unspecified: Secondary | ICD-10-CM | POA: Diagnosis not present

## 2023-05-26 DIAGNOSIS — W19XXXA Unspecified fall, initial encounter: Secondary | ICD-10-CM | POA: Diagnosis not present

## 2023-05-26 DIAGNOSIS — K219 Gastro-esophageal reflux disease without esophagitis: Secondary | ICD-10-CM | POA: Diagnosis not present

## 2023-05-26 DIAGNOSIS — I160 Hypertensive urgency: Secondary | ICD-10-CM | POA: Diagnosis not present

## 2023-05-26 DIAGNOSIS — N179 Acute kidney failure, unspecified: Secondary | ICD-10-CM | POA: Diagnosis not present

## 2023-05-26 DIAGNOSIS — M6282 Rhabdomyolysis: Secondary | ICD-10-CM | POA: Diagnosis not present

## 2023-05-27 DIAGNOSIS — W19XXXA Unspecified fall, initial encounter: Secondary | ICD-10-CM | POA: Diagnosis not present

## 2023-05-27 DIAGNOSIS — R4182 Altered mental status, unspecified: Secondary | ICD-10-CM | POA: Diagnosis not present

## 2023-05-27 DIAGNOSIS — E039 Hypothyroidism, unspecified: Secondary | ICD-10-CM | POA: Diagnosis not present

## 2023-05-27 DIAGNOSIS — R531 Weakness: Secondary | ICD-10-CM | POA: Diagnosis not present

## 2023-05-27 DIAGNOSIS — K219 Gastro-esophageal reflux disease without esophagitis: Secondary | ICD-10-CM | POA: Diagnosis not present

## 2023-05-27 DIAGNOSIS — I160 Hypertensive urgency: Secondary | ICD-10-CM | POA: Diagnosis not present

## 2023-05-27 DIAGNOSIS — M6282 Rhabdomyolysis: Secondary | ICD-10-CM | POA: Diagnosis not present

## 2023-05-27 DIAGNOSIS — R262 Difficulty in walking, not elsewhere classified: Secondary | ICD-10-CM | POA: Diagnosis not present

## 2023-05-27 DIAGNOSIS — N179 Acute kidney failure, unspecified: Secondary | ICD-10-CM | POA: Diagnosis not present

## 2023-05-28 DIAGNOSIS — I160 Hypertensive urgency: Secondary | ICD-10-CM | POA: Diagnosis not present

## 2023-05-28 DIAGNOSIS — R262 Difficulty in walking, not elsewhere classified: Secondary | ICD-10-CM | POA: Diagnosis not present

## 2023-05-28 DIAGNOSIS — M6282 Rhabdomyolysis: Secondary | ICD-10-CM | POA: Diagnosis not present

## 2023-05-28 DIAGNOSIS — R531 Weakness: Secondary | ICD-10-CM | POA: Diagnosis not present

## 2023-05-28 DIAGNOSIS — W19XXXA Unspecified fall, initial encounter: Secondary | ICD-10-CM | POA: Diagnosis not present

## 2023-05-28 DIAGNOSIS — R4182 Altered mental status, unspecified: Secondary | ICD-10-CM | POA: Diagnosis not present

## 2023-05-28 DIAGNOSIS — N179 Acute kidney failure, unspecified: Secondary | ICD-10-CM | POA: Diagnosis not present

## 2023-05-28 DIAGNOSIS — K219 Gastro-esophageal reflux disease without esophagitis: Secondary | ICD-10-CM | POA: Diagnosis not present

## 2023-05-28 DIAGNOSIS — E039 Hypothyroidism, unspecified: Secondary | ICD-10-CM | POA: Diagnosis not present

## 2023-05-29 DIAGNOSIS — R4182 Altered mental status, unspecified: Secondary | ICD-10-CM | POA: Diagnosis not present

## 2023-05-29 DIAGNOSIS — M6282 Rhabdomyolysis: Secondary | ICD-10-CM | POA: Diagnosis not present

## 2023-05-29 DIAGNOSIS — I160 Hypertensive urgency: Secondary | ICD-10-CM | POA: Diagnosis not present

## 2023-05-29 DIAGNOSIS — R262 Difficulty in walking, not elsewhere classified: Secondary | ICD-10-CM | POA: Diagnosis not present

## 2023-05-29 DIAGNOSIS — R531 Weakness: Secondary | ICD-10-CM | POA: Diagnosis not present

## 2023-05-29 DIAGNOSIS — E039 Hypothyroidism, unspecified: Secondary | ICD-10-CM | POA: Diagnosis not present

## 2023-05-29 DIAGNOSIS — K219 Gastro-esophageal reflux disease without esophagitis: Secondary | ICD-10-CM | POA: Diagnosis not present

## 2023-05-29 DIAGNOSIS — W19XXXA Unspecified fall, initial encounter: Secondary | ICD-10-CM | POA: Diagnosis not present

## 2023-05-29 DIAGNOSIS — N179 Acute kidney failure, unspecified: Secondary | ICD-10-CM | POA: Diagnosis not present

## 2023-05-30 DIAGNOSIS — R262 Difficulty in walking, not elsewhere classified: Secondary | ICD-10-CM | POA: Diagnosis not present

## 2023-05-30 DIAGNOSIS — I160 Hypertensive urgency: Secondary | ICD-10-CM | POA: Diagnosis not present

## 2023-05-30 DIAGNOSIS — W19XXXA Unspecified fall, initial encounter: Secondary | ICD-10-CM | POA: Diagnosis not present

## 2023-05-30 DIAGNOSIS — R4182 Altered mental status, unspecified: Secondary | ICD-10-CM | POA: Diagnosis not present

## 2023-05-30 DIAGNOSIS — E876 Hypokalemia: Secondary | ICD-10-CM | POA: Diagnosis not present

## 2023-05-30 DIAGNOSIS — I10 Essential (primary) hypertension: Secondary | ICD-10-CM | POA: Diagnosis not present

## 2023-05-30 DIAGNOSIS — R531 Weakness: Secondary | ICD-10-CM | POA: Diagnosis not present

## 2023-05-30 DIAGNOSIS — M6282 Rhabdomyolysis: Secondary | ICD-10-CM | POA: Diagnosis not present

## 2023-05-30 DIAGNOSIS — N179 Acute kidney failure, unspecified: Secondary | ICD-10-CM | POA: Diagnosis not present

## 2023-05-30 DIAGNOSIS — E039 Hypothyroidism, unspecified: Secondary | ICD-10-CM | POA: Diagnosis not present

## 2023-05-30 DIAGNOSIS — K219 Gastro-esophageal reflux disease without esophagitis: Secondary | ICD-10-CM | POA: Diagnosis not present

## 2023-05-31 DIAGNOSIS — E876 Hypokalemia: Secondary | ICD-10-CM | POA: Diagnosis not present

## 2023-05-31 DIAGNOSIS — M6282 Rhabdomyolysis: Secondary | ICD-10-CM | POA: Diagnosis not present

## 2023-05-31 DIAGNOSIS — W19XXXA Unspecified fall, initial encounter: Secondary | ICD-10-CM | POA: Diagnosis not present

## 2023-05-31 DIAGNOSIS — N179 Acute kidney failure, unspecified: Secondary | ICD-10-CM | POA: Diagnosis not present

## 2023-05-31 DIAGNOSIS — K219 Gastro-esophageal reflux disease without esophagitis: Secondary | ICD-10-CM | POA: Diagnosis not present

## 2023-05-31 DIAGNOSIS — R262 Difficulty in walking, not elsewhere classified: Secondary | ICD-10-CM | POA: Diagnosis not present

## 2023-05-31 DIAGNOSIS — I160 Hypertensive urgency: Secondary | ICD-10-CM | POA: Diagnosis not present

## 2023-05-31 DIAGNOSIS — R531 Weakness: Secondary | ICD-10-CM | POA: Diagnosis not present

## 2023-05-31 DIAGNOSIS — R4182 Altered mental status, unspecified: Secondary | ICD-10-CM | POA: Diagnosis not present

## 2023-06-01 DIAGNOSIS — R4182 Altered mental status, unspecified: Secondary | ICD-10-CM | POA: Diagnosis not present

## 2023-06-01 DIAGNOSIS — E876 Hypokalemia: Secondary | ICD-10-CM | POA: Diagnosis not present

## 2023-06-01 DIAGNOSIS — R531 Weakness: Secondary | ICD-10-CM | POA: Diagnosis not present

## 2023-06-01 DIAGNOSIS — I160 Hypertensive urgency: Secondary | ICD-10-CM | POA: Diagnosis not present

## 2023-06-01 DIAGNOSIS — K219 Gastro-esophageal reflux disease without esophagitis: Secondary | ICD-10-CM | POA: Diagnosis not present

## 2023-06-01 DIAGNOSIS — M6282 Rhabdomyolysis: Secondary | ICD-10-CM | POA: Diagnosis not present

## 2023-06-01 DIAGNOSIS — N179 Acute kidney failure, unspecified: Secondary | ICD-10-CM | POA: Diagnosis not present

## 2023-06-01 DIAGNOSIS — W19XXXA Unspecified fall, initial encounter: Secondary | ICD-10-CM | POA: Diagnosis not present

## 2023-06-01 DIAGNOSIS — R262 Difficulty in walking, not elsewhere classified: Secondary | ICD-10-CM | POA: Diagnosis not present

## 2023-06-02 ENCOUNTER — Telehealth: Payer: Self-pay

## 2023-06-02 DIAGNOSIS — R4182 Altered mental status, unspecified: Secondary | ICD-10-CM | POA: Diagnosis not present

## 2023-06-02 DIAGNOSIS — M6282 Rhabdomyolysis: Secondary | ICD-10-CM | POA: Diagnosis not present

## 2023-06-02 DIAGNOSIS — E876 Hypokalemia: Secondary | ICD-10-CM | POA: Diagnosis not present

## 2023-06-02 DIAGNOSIS — W19XXXA Unspecified fall, initial encounter: Secondary | ICD-10-CM | POA: Diagnosis not present

## 2023-06-02 DIAGNOSIS — K219 Gastro-esophageal reflux disease without esophagitis: Secondary | ICD-10-CM | POA: Diagnosis not present

## 2023-06-02 DIAGNOSIS — I160 Hypertensive urgency: Secondary | ICD-10-CM | POA: Diagnosis not present

## 2023-06-02 DIAGNOSIS — N179 Acute kidney failure, unspecified: Secondary | ICD-10-CM | POA: Diagnosis not present

## 2023-06-02 DIAGNOSIS — R531 Weakness: Secondary | ICD-10-CM | POA: Diagnosis not present

## 2023-06-02 DIAGNOSIS — R262 Difficulty in walking, not elsewhere classified: Secondary | ICD-10-CM | POA: Diagnosis not present

## 2023-06-02 NOTE — Telephone Encounter (Signed)
Copied from CRM [id number]. Topic: General - Other >> Jun 02, 2023 12:52 PM Alvino Blood C wrote: Reason for CRM: Patient's son called looking to confirm the name of the company in which his mother was recving home health services through. Kathlene November would like a call back at [phone] Morrell Riddle- Son

## 2023-06-02 NOTE — Telephone Encounter (Signed)
Left detailed message on sons voicemail that last home health services ordered were through [facility]. Advised to call back to office if anything further needed.

## 2023-06-03 DIAGNOSIS — E876 Hypokalemia: Secondary | ICD-10-CM | POA: Diagnosis not present

## 2023-06-03 DIAGNOSIS — W19XXXA Unspecified fall, initial encounter: Secondary | ICD-10-CM | POA: Diagnosis not present

## 2023-06-03 DIAGNOSIS — R4182 Altered mental status, unspecified: Secondary | ICD-10-CM | POA: Diagnosis not present

## 2023-06-03 DIAGNOSIS — R531 Weakness: Secondary | ICD-10-CM | POA: Diagnosis not present

## 2023-06-03 DIAGNOSIS — R262 Difficulty in walking, not elsewhere classified: Secondary | ICD-10-CM | POA: Diagnosis not present

## 2023-06-03 DIAGNOSIS — K219 Gastro-esophageal reflux disease without esophagitis: Secondary | ICD-10-CM | POA: Diagnosis not present

## 2023-06-03 DIAGNOSIS — N179 Acute kidney failure, unspecified: Secondary | ICD-10-CM | POA: Diagnosis not present

## 2023-06-03 DIAGNOSIS — I160 Hypertensive urgency: Secondary | ICD-10-CM | POA: Diagnosis not present

## 2023-06-03 DIAGNOSIS — M6282 Rhabdomyolysis: Secondary | ICD-10-CM | POA: Diagnosis not present

## 2023-06-04 DIAGNOSIS — W19XXXA Unspecified fall, initial encounter: Secondary | ICD-10-CM | POA: Diagnosis not present

## 2023-06-04 DIAGNOSIS — K219 Gastro-esophageal reflux disease without esophagitis: Secondary | ICD-10-CM | POA: Diagnosis not present

## 2023-06-04 DIAGNOSIS — R262 Difficulty in walking, not elsewhere classified: Secondary | ICD-10-CM | POA: Diagnosis not present

## 2023-06-04 DIAGNOSIS — R4182 Altered mental status, unspecified: Secondary | ICD-10-CM | POA: Diagnosis not present

## 2023-06-04 DIAGNOSIS — R531 Weakness: Secondary | ICD-10-CM | POA: Diagnosis not present

## 2023-06-04 DIAGNOSIS — N179 Acute kidney failure, unspecified: Secondary | ICD-10-CM | POA: Diagnosis not present

## 2023-06-04 DIAGNOSIS — E876 Hypokalemia: Secondary | ICD-10-CM | POA: Diagnosis not present

## 2023-06-04 DIAGNOSIS — I160 Hypertensive urgency: Secondary | ICD-10-CM | POA: Diagnosis not present

## 2023-06-04 DIAGNOSIS — M6282 Rhabdomyolysis: Secondary | ICD-10-CM | POA: Diagnosis not present

## 2023-06-05 DIAGNOSIS — R262 Difficulty in walking, not elsewhere classified: Secondary | ICD-10-CM | POA: Diagnosis not present

## 2023-06-05 DIAGNOSIS — E876 Hypokalemia: Secondary | ICD-10-CM | POA: Diagnosis not present

## 2023-06-05 DIAGNOSIS — N179 Acute kidney failure, unspecified: Secondary | ICD-10-CM | POA: Diagnosis not present

## 2023-06-05 DIAGNOSIS — R531 Weakness: Secondary | ICD-10-CM | POA: Diagnosis not present

## 2023-06-05 DIAGNOSIS — K219 Gastro-esophageal reflux disease without esophagitis: Secondary | ICD-10-CM | POA: Diagnosis not present

## 2023-06-05 DIAGNOSIS — I160 Hypertensive urgency: Secondary | ICD-10-CM | POA: Diagnosis not present

## 2023-06-05 DIAGNOSIS — W19XXXA Unspecified fall, initial encounter: Secondary | ICD-10-CM | POA: Diagnosis not present

## 2023-06-05 DIAGNOSIS — R4182 Altered mental status, unspecified: Secondary | ICD-10-CM | POA: Diagnosis not present

## 2023-06-05 DIAGNOSIS — M6282 Rhabdomyolysis: Secondary | ICD-10-CM | POA: Diagnosis not present

## 2023-06-06 DIAGNOSIS — N179 Acute kidney failure, unspecified: Secondary | ICD-10-CM | POA: Diagnosis not present

## 2023-06-06 DIAGNOSIS — I160 Hypertensive urgency: Secondary | ICD-10-CM | POA: Diagnosis not present

## 2023-06-06 DIAGNOSIS — W19XXXA Unspecified fall, initial encounter: Secondary | ICD-10-CM | POA: Diagnosis not present

## 2023-06-06 DIAGNOSIS — M6282 Rhabdomyolysis: Secondary | ICD-10-CM | POA: Diagnosis not present

## 2023-06-06 DIAGNOSIS — R531 Weakness: Secondary | ICD-10-CM | POA: Diagnosis not present

## 2023-06-06 DIAGNOSIS — E876 Hypokalemia: Secondary | ICD-10-CM | POA: Diagnosis not present

## 2023-06-06 DIAGNOSIS — R4182 Altered mental status, unspecified: Secondary | ICD-10-CM | POA: Diagnosis not present

## 2023-06-06 DIAGNOSIS — K219 Gastro-esophageal reflux disease without esophagitis: Secondary | ICD-10-CM | POA: Diagnosis not present

## 2023-06-06 DIAGNOSIS — R262 Difficulty in walking, not elsewhere classified: Secondary | ICD-10-CM | POA: Diagnosis not present

## 2023-06-07 DIAGNOSIS — E876 Hypokalemia: Secondary | ICD-10-CM | POA: Diagnosis not present

## 2023-06-07 DIAGNOSIS — I160 Hypertensive urgency: Secondary | ICD-10-CM | POA: Diagnosis not present

## 2023-06-07 DIAGNOSIS — R262 Difficulty in walking, not elsewhere classified: Secondary | ICD-10-CM | POA: Diagnosis not present

## 2023-06-07 DIAGNOSIS — W19XXXA Unspecified fall, initial encounter: Secondary | ICD-10-CM | POA: Diagnosis not present

## 2023-06-07 DIAGNOSIS — R4182 Altered mental status, unspecified: Secondary | ICD-10-CM | POA: Diagnosis not present

## 2023-06-07 DIAGNOSIS — M6282 Rhabdomyolysis: Secondary | ICD-10-CM | POA: Diagnosis not present

## 2023-06-07 DIAGNOSIS — K219 Gastro-esophageal reflux disease without esophagitis: Secondary | ICD-10-CM | POA: Diagnosis not present

## 2023-06-07 DIAGNOSIS — R531 Weakness: Secondary | ICD-10-CM | POA: Diagnosis not present

## 2023-06-07 DIAGNOSIS — N179 Acute kidney failure, unspecified: Secondary | ICD-10-CM | POA: Diagnosis not present

## 2023-06-08 DIAGNOSIS — R531 Weakness: Secondary | ICD-10-CM | POA: Diagnosis not present

## 2023-06-08 DIAGNOSIS — E876 Hypokalemia: Secondary | ICD-10-CM | POA: Diagnosis not present

## 2023-06-08 DIAGNOSIS — R4182 Altered mental status, unspecified: Secondary | ICD-10-CM | POA: Diagnosis not present

## 2023-06-08 DIAGNOSIS — I160 Hypertensive urgency: Secondary | ICD-10-CM | POA: Diagnosis not present

## 2023-06-08 DIAGNOSIS — W19XXXA Unspecified fall, initial encounter: Secondary | ICD-10-CM | POA: Diagnosis not present

## 2023-06-08 DIAGNOSIS — K219 Gastro-esophageal reflux disease without esophagitis: Secondary | ICD-10-CM | POA: Diagnosis not present

## 2023-06-08 DIAGNOSIS — N179 Acute kidney failure, unspecified: Secondary | ICD-10-CM | POA: Diagnosis not present

## 2023-06-08 DIAGNOSIS — R262 Difficulty in walking, not elsewhere classified: Secondary | ICD-10-CM | POA: Diagnosis not present

## 2023-06-08 DIAGNOSIS — M6282 Rhabdomyolysis: Secondary | ICD-10-CM | POA: Diagnosis not present

## 2023-06-09 ENCOUNTER — Encounter: Payer: Self-pay | Admitting: Nurse Practitioner

## 2023-06-09 DIAGNOSIS — N179 Acute kidney failure, unspecified: Secondary | ICD-10-CM | POA: Diagnosis not present

## 2023-06-09 DIAGNOSIS — W19XXXA Unspecified fall, initial encounter: Secondary | ICD-10-CM | POA: Diagnosis not present

## 2023-06-09 DIAGNOSIS — R531 Weakness: Secondary | ICD-10-CM | POA: Diagnosis not present

## 2023-06-09 DIAGNOSIS — I160 Hypertensive urgency: Secondary | ICD-10-CM | POA: Diagnosis not present

## 2023-06-09 DIAGNOSIS — K219 Gastro-esophageal reflux disease without esophagitis: Secondary | ICD-10-CM | POA: Diagnosis not present

## 2023-06-09 DIAGNOSIS — R4182 Altered mental status, unspecified: Secondary | ICD-10-CM | POA: Diagnosis not present

## 2023-06-09 DIAGNOSIS — E876 Hypokalemia: Secondary | ICD-10-CM | POA: Diagnosis not present

## 2023-06-09 DIAGNOSIS — M6282 Rhabdomyolysis: Secondary | ICD-10-CM | POA: Diagnosis not present

## 2023-06-09 DIAGNOSIS — R262 Difficulty in walking, not elsewhere classified: Secondary | ICD-10-CM | POA: Diagnosis not present

## 2023-06-10 DIAGNOSIS — N179 Acute kidney failure, unspecified: Secondary | ICD-10-CM | POA: Diagnosis not present

## 2023-06-10 DIAGNOSIS — I160 Hypertensive urgency: Secondary | ICD-10-CM | POA: Diagnosis not present

## 2023-06-10 DIAGNOSIS — M6282 Rhabdomyolysis: Secondary | ICD-10-CM | POA: Diagnosis not present

## 2023-06-10 DIAGNOSIS — R531 Weakness: Secondary | ICD-10-CM | POA: Diagnosis not present

## 2023-06-10 DIAGNOSIS — W19XXXA Unspecified fall, initial encounter: Secondary | ICD-10-CM | POA: Diagnosis not present

## 2023-06-10 DIAGNOSIS — E876 Hypokalemia: Secondary | ICD-10-CM | POA: Diagnosis not present

## 2023-06-10 DIAGNOSIS — R262 Difficulty in walking, not elsewhere classified: Secondary | ICD-10-CM | POA: Diagnosis not present

## 2023-06-10 DIAGNOSIS — R4182 Altered mental status, unspecified: Secondary | ICD-10-CM | POA: Diagnosis not present

## 2023-06-10 DIAGNOSIS — K219 Gastro-esophageal reflux disease without esophagitis: Secondary | ICD-10-CM | POA: Diagnosis not present

## 2023-06-14 DIAGNOSIS — W19XXXA Unspecified fall, initial encounter: Secondary | ICD-10-CM | POA: Diagnosis not present

## 2023-06-14 DIAGNOSIS — M6282 Rhabdomyolysis: Secondary | ICD-10-CM | POA: Diagnosis not present

## 2023-06-14 DIAGNOSIS — N179 Acute kidney failure, unspecified: Secondary | ICD-10-CM | POA: Diagnosis not present

## 2023-06-14 DIAGNOSIS — N390 Urinary tract infection, site not specified: Secondary | ICD-10-CM | POA: Diagnosis not present

## 2023-06-14 DIAGNOSIS — R531 Weakness: Secondary | ICD-10-CM | POA: Diagnosis not present

## 2023-06-14 DIAGNOSIS — I160 Hypertensive urgency: Secondary | ICD-10-CM | POA: Diagnosis not present

## 2023-06-14 DIAGNOSIS — E876 Hypokalemia: Secondary | ICD-10-CM | POA: Diagnosis not present

## 2023-06-14 DIAGNOSIS — R262 Difficulty in walking, not elsewhere classified: Secondary | ICD-10-CM | POA: Diagnosis not present

## 2023-06-14 DIAGNOSIS — K219 Gastro-esophageal reflux disease without esophagitis: Secondary | ICD-10-CM | POA: Diagnosis not present

## 2023-06-14 DIAGNOSIS — R4182 Altered mental status, unspecified: Secondary | ICD-10-CM | POA: Diagnosis not present

## 2023-06-17 DIAGNOSIS — I160 Hypertensive urgency: Secondary | ICD-10-CM | POA: Diagnosis not present

## 2023-06-17 DIAGNOSIS — E876 Hypokalemia: Secondary | ICD-10-CM | POA: Diagnosis not present

## 2023-06-17 DIAGNOSIS — F419 Anxiety disorder, unspecified: Secondary | ICD-10-CM | POA: Diagnosis not present

## 2023-06-17 DIAGNOSIS — E039 Hypothyroidism, unspecified: Secondary | ICD-10-CM | POA: Diagnosis not present

## 2023-06-17 DIAGNOSIS — I10 Essential (primary) hypertension: Secondary | ICD-10-CM | POA: Diagnosis not present

## 2023-06-17 DIAGNOSIS — M6282 Rhabdomyolysis: Secondary | ICD-10-CM | POA: Diagnosis not present

## 2023-06-17 DIAGNOSIS — H9190 Unspecified hearing loss, unspecified ear: Secondary | ICD-10-CM | POA: Diagnosis not present

## 2023-06-17 DIAGNOSIS — K219 Gastro-esophageal reflux disease without esophagitis: Secondary | ICD-10-CM | POA: Diagnosis not present

## 2023-06-17 DIAGNOSIS — N179 Acute kidney failure, unspecified: Secondary | ICD-10-CM | POA: Diagnosis not present

## 2023-06-20 DIAGNOSIS — E039 Hypothyroidism, unspecified: Secondary | ICD-10-CM | POA: Diagnosis not present

## 2023-06-20 DIAGNOSIS — I160 Hypertensive urgency: Secondary | ICD-10-CM | POA: Diagnosis not present

## 2023-06-20 DIAGNOSIS — M6282 Rhabdomyolysis: Secondary | ICD-10-CM | POA: Diagnosis not present

## 2023-06-20 DIAGNOSIS — I10 Essential (primary) hypertension: Secondary | ICD-10-CM | POA: Diagnosis not present

## 2023-06-20 DIAGNOSIS — K219 Gastro-esophageal reflux disease without esophagitis: Secondary | ICD-10-CM | POA: Diagnosis not present

## 2023-06-20 DIAGNOSIS — E876 Hypokalemia: Secondary | ICD-10-CM | POA: Diagnosis not present

## 2023-06-20 DIAGNOSIS — N179 Acute kidney failure, unspecified: Secondary | ICD-10-CM | POA: Diagnosis not present

## 2023-06-20 DIAGNOSIS — H9190 Unspecified hearing loss, unspecified ear: Secondary | ICD-10-CM | POA: Diagnosis not present

## 2023-06-20 DIAGNOSIS — F419 Anxiety disorder, unspecified: Secondary | ICD-10-CM | POA: Diagnosis not present

## 2023-06-22 DIAGNOSIS — M6282 Rhabdomyolysis: Secondary | ICD-10-CM | POA: Diagnosis not present

## 2023-06-22 DIAGNOSIS — I160 Hypertensive urgency: Secondary | ICD-10-CM | POA: Diagnosis not present

## 2023-06-22 DIAGNOSIS — F419 Anxiety disorder, unspecified: Secondary | ICD-10-CM | POA: Diagnosis not present

## 2023-06-22 DIAGNOSIS — H9190 Unspecified hearing loss, unspecified ear: Secondary | ICD-10-CM | POA: Diagnosis not present

## 2023-06-22 DIAGNOSIS — E039 Hypothyroidism, unspecified: Secondary | ICD-10-CM | POA: Diagnosis not present

## 2023-06-22 DIAGNOSIS — E876 Hypokalemia: Secondary | ICD-10-CM | POA: Diagnosis not present

## 2023-06-22 DIAGNOSIS — I10 Essential (primary) hypertension: Secondary | ICD-10-CM | POA: Diagnosis not present

## 2023-06-22 DIAGNOSIS — K219 Gastro-esophageal reflux disease without esophagitis: Secondary | ICD-10-CM | POA: Diagnosis not present

## 2023-06-22 DIAGNOSIS — N179 Acute kidney failure, unspecified: Secondary | ICD-10-CM | POA: Diagnosis not present

## 2023-06-23 DIAGNOSIS — I10 Essential (primary) hypertension: Secondary | ICD-10-CM | POA: Diagnosis not present

## 2023-06-23 DIAGNOSIS — F419 Anxiety disorder, unspecified: Secondary | ICD-10-CM | POA: Diagnosis not present

## 2023-06-23 DIAGNOSIS — E876 Hypokalemia: Secondary | ICD-10-CM | POA: Diagnosis not present

## 2023-06-23 DIAGNOSIS — M6282 Rhabdomyolysis: Secondary | ICD-10-CM | POA: Diagnosis not present

## 2023-06-23 DIAGNOSIS — H9190 Unspecified hearing loss, unspecified ear: Secondary | ICD-10-CM | POA: Diagnosis not present

## 2023-06-23 DIAGNOSIS — K219 Gastro-esophageal reflux disease without esophagitis: Secondary | ICD-10-CM | POA: Diagnosis not present

## 2023-06-23 DIAGNOSIS — N179 Acute kidney failure, unspecified: Secondary | ICD-10-CM | POA: Diagnosis not present

## 2023-06-23 DIAGNOSIS — E039 Hypothyroidism, unspecified: Secondary | ICD-10-CM | POA: Diagnosis not present

## 2023-06-23 DIAGNOSIS — I160 Hypertensive urgency: Secondary | ICD-10-CM | POA: Diagnosis not present

## 2023-06-24 DIAGNOSIS — K219 Gastro-esophageal reflux disease without esophagitis: Secondary | ICD-10-CM | POA: Diagnosis not present

## 2023-06-24 DIAGNOSIS — I10 Essential (primary) hypertension: Secondary | ICD-10-CM | POA: Diagnosis not present

## 2023-06-24 DIAGNOSIS — N179 Acute kidney failure, unspecified: Secondary | ICD-10-CM | POA: Diagnosis not present

## 2023-06-24 DIAGNOSIS — H9190 Unspecified hearing loss, unspecified ear: Secondary | ICD-10-CM | POA: Diagnosis not present

## 2023-06-24 DIAGNOSIS — E039 Hypothyroidism, unspecified: Secondary | ICD-10-CM | POA: Diagnosis not present

## 2023-06-24 DIAGNOSIS — I160 Hypertensive urgency: Secondary | ICD-10-CM | POA: Diagnosis not present

## 2023-06-24 DIAGNOSIS — E876 Hypokalemia: Secondary | ICD-10-CM | POA: Diagnosis not present

## 2023-06-24 DIAGNOSIS — F419 Anxiety disorder, unspecified: Secondary | ICD-10-CM | POA: Diagnosis not present

## 2023-06-24 DIAGNOSIS — M6282 Rhabdomyolysis: Secondary | ICD-10-CM | POA: Diagnosis not present

## 2023-06-27 DIAGNOSIS — N179 Acute kidney failure, unspecified: Secondary | ICD-10-CM | POA: Diagnosis not present

## 2023-06-27 DIAGNOSIS — K219 Gastro-esophageal reflux disease without esophagitis: Secondary | ICD-10-CM | POA: Diagnosis not present

## 2023-06-27 DIAGNOSIS — Z87440 Personal history of urinary (tract) infections: Secondary | ICD-10-CM | POA: Diagnosis not present

## 2023-06-27 DIAGNOSIS — I10 Essential (primary) hypertension: Secondary | ICD-10-CM | POA: Diagnosis not present

## 2023-06-27 DIAGNOSIS — E039 Hypothyroidism, unspecified: Secondary | ICD-10-CM | POA: Diagnosis not present

## 2023-06-27 DIAGNOSIS — F419 Anxiety disorder, unspecified: Secondary | ICD-10-CM | POA: Diagnosis not present

## 2023-06-27 DIAGNOSIS — H9190 Unspecified hearing loss, unspecified ear: Secondary | ICD-10-CM | POA: Diagnosis not present

## 2023-06-27 DIAGNOSIS — I160 Hypertensive urgency: Secondary | ICD-10-CM | POA: Diagnosis not present

## 2023-06-27 DIAGNOSIS — M6282 Rhabdomyolysis: Secondary | ICD-10-CM | POA: Diagnosis not present

## 2023-06-27 DIAGNOSIS — E876 Hypokalemia: Secondary | ICD-10-CM | POA: Diagnosis not present

## 2023-06-28 DIAGNOSIS — H9190 Unspecified hearing loss, unspecified ear: Secondary | ICD-10-CM | POA: Diagnosis not present

## 2023-06-28 DIAGNOSIS — M6282 Rhabdomyolysis: Secondary | ICD-10-CM | POA: Diagnosis not present

## 2023-06-28 DIAGNOSIS — K219 Gastro-esophageal reflux disease without esophagitis: Secondary | ICD-10-CM | POA: Diagnosis not present

## 2023-06-28 DIAGNOSIS — F419 Anxiety disorder, unspecified: Secondary | ICD-10-CM | POA: Diagnosis not present

## 2023-06-28 DIAGNOSIS — I160 Hypertensive urgency: Secondary | ICD-10-CM | POA: Diagnosis not present

## 2023-06-28 DIAGNOSIS — E039 Hypothyroidism, unspecified: Secondary | ICD-10-CM | POA: Diagnosis not present

## 2023-06-28 DIAGNOSIS — E876 Hypokalemia: Secondary | ICD-10-CM | POA: Diagnosis not present

## 2023-06-28 DIAGNOSIS — I10 Essential (primary) hypertension: Secondary | ICD-10-CM | POA: Diagnosis not present

## 2023-06-28 DIAGNOSIS — N179 Acute kidney failure, unspecified: Secondary | ICD-10-CM | POA: Diagnosis not present

## 2023-07-05 DIAGNOSIS — K219 Gastro-esophageal reflux disease without esophagitis: Secondary | ICD-10-CM | POA: Diagnosis not present

## 2023-07-05 DIAGNOSIS — F419 Anxiety disorder, unspecified: Secondary | ICD-10-CM | POA: Diagnosis not present

## 2023-07-05 DIAGNOSIS — H9190 Unspecified hearing loss, unspecified ear: Secondary | ICD-10-CM | POA: Diagnosis not present

## 2023-07-05 DIAGNOSIS — M6282 Rhabdomyolysis: Secondary | ICD-10-CM | POA: Diagnosis not present

## 2023-07-05 DIAGNOSIS — I10 Essential (primary) hypertension: Secondary | ICD-10-CM | POA: Diagnosis not present

## 2023-07-05 DIAGNOSIS — I160 Hypertensive urgency: Secondary | ICD-10-CM | POA: Diagnosis not present

## 2023-07-05 DIAGNOSIS — N179 Acute kidney failure, unspecified: Secondary | ICD-10-CM | POA: Diagnosis not present

## 2023-07-05 DIAGNOSIS — E876 Hypokalemia: Secondary | ICD-10-CM | POA: Diagnosis not present

## 2023-07-05 DIAGNOSIS — E039 Hypothyroidism, unspecified: Secondary | ICD-10-CM | POA: Diagnosis not present

## 2023-07-07 DIAGNOSIS — M6282 Rhabdomyolysis: Secondary | ICD-10-CM | POA: Diagnosis not present

## 2023-07-07 DIAGNOSIS — F419 Anxiety disorder, unspecified: Secondary | ICD-10-CM | POA: Diagnosis not present

## 2023-07-07 DIAGNOSIS — K219 Gastro-esophageal reflux disease without esophagitis: Secondary | ICD-10-CM | POA: Diagnosis not present

## 2023-07-07 DIAGNOSIS — N179 Acute kidney failure, unspecified: Secondary | ICD-10-CM | POA: Diagnosis not present

## 2023-07-07 DIAGNOSIS — H9190 Unspecified hearing loss, unspecified ear: Secondary | ICD-10-CM | POA: Diagnosis not present

## 2023-07-07 DIAGNOSIS — I160 Hypertensive urgency: Secondary | ICD-10-CM | POA: Diagnosis not present

## 2023-07-07 DIAGNOSIS — E876 Hypokalemia: Secondary | ICD-10-CM | POA: Diagnosis not present

## 2023-07-07 DIAGNOSIS — E039 Hypothyroidism, unspecified: Secondary | ICD-10-CM | POA: Diagnosis not present

## 2023-07-07 DIAGNOSIS — I10 Essential (primary) hypertension: Secondary | ICD-10-CM | POA: Diagnosis not present

## 2023-07-12 DIAGNOSIS — K219 Gastro-esophageal reflux disease without esophagitis: Secondary | ICD-10-CM | POA: Diagnosis not present

## 2023-07-12 DIAGNOSIS — F419 Anxiety disorder, unspecified: Secondary | ICD-10-CM | POA: Diagnosis not present

## 2023-07-12 DIAGNOSIS — I10 Essential (primary) hypertension: Secondary | ICD-10-CM | POA: Diagnosis not present

## 2023-07-12 DIAGNOSIS — M6282 Rhabdomyolysis: Secondary | ICD-10-CM | POA: Diagnosis not present

## 2023-07-12 DIAGNOSIS — E876 Hypokalemia: Secondary | ICD-10-CM | POA: Diagnosis not present

## 2023-07-12 DIAGNOSIS — N179 Acute kidney failure, unspecified: Secondary | ICD-10-CM | POA: Diagnosis not present

## 2023-07-12 DIAGNOSIS — E039 Hypothyroidism, unspecified: Secondary | ICD-10-CM | POA: Diagnosis not present

## 2023-07-12 DIAGNOSIS — H9190 Unspecified hearing loss, unspecified ear: Secondary | ICD-10-CM | POA: Diagnosis not present

## 2023-07-12 DIAGNOSIS — I160 Hypertensive urgency: Secondary | ICD-10-CM | POA: Diagnosis not present

## 2023-07-14 DIAGNOSIS — I160 Hypertensive urgency: Secondary | ICD-10-CM | POA: Diagnosis not present

## 2023-07-14 DIAGNOSIS — F419 Anxiety disorder, unspecified: Secondary | ICD-10-CM | POA: Diagnosis not present

## 2023-07-14 DIAGNOSIS — E039 Hypothyroidism, unspecified: Secondary | ICD-10-CM | POA: Diagnosis not present

## 2023-07-14 DIAGNOSIS — N179 Acute kidney failure, unspecified: Secondary | ICD-10-CM | POA: Diagnosis not present

## 2023-07-14 DIAGNOSIS — M6282 Rhabdomyolysis: Secondary | ICD-10-CM | POA: Diagnosis not present

## 2023-07-14 DIAGNOSIS — H9190 Unspecified hearing loss, unspecified ear: Secondary | ICD-10-CM | POA: Diagnosis not present

## 2023-07-14 DIAGNOSIS — E876 Hypokalemia: Secondary | ICD-10-CM | POA: Diagnosis not present

## 2023-07-14 DIAGNOSIS — I10 Essential (primary) hypertension: Secondary | ICD-10-CM | POA: Diagnosis not present

## 2023-07-14 DIAGNOSIS — K219 Gastro-esophageal reflux disease without esophagitis: Secondary | ICD-10-CM | POA: Diagnosis not present

## 2023-07-15 DIAGNOSIS — E876 Hypokalemia: Secondary | ICD-10-CM | POA: Diagnosis not present

## 2023-07-15 DIAGNOSIS — I10 Essential (primary) hypertension: Secondary | ICD-10-CM | POA: Diagnosis not present

## 2023-07-15 DIAGNOSIS — F419 Anxiety disorder, unspecified: Secondary | ICD-10-CM | POA: Diagnosis not present

## 2023-07-15 DIAGNOSIS — M6282 Rhabdomyolysis: Secondary | ICD-10-CM | POA: Diagnosis not present

## 2023-07-15 DIAGNOSIS — K219 Gastro-esophageal reflux disease without esophagitis: Secondary | ICD-10-CM | POA: Diagnosis not present

## 2023-07-15 DIAGNOSIS — E039 Hypothyroidism, unspecified: Secondary | ICD-10-CM | POA: Diagnosis not present

## 2023-07-15 DIAGNOSIS — N179 Acute kidney failure, unspecified: Secondary | ICD-10-CM | POA: Diagnosis not present

## 2023-07-15 DIAGNOSIS — I160 Hypertensive urgency: Secondary | ICD-10-CM | POA: Diagnosis not present

## 2023-07-15 DIAGNOSIS — H9190 Unspecified hearing loss, unspecified ear: Secondary | ICD-10-CM | POA: Diagnosis not present

## 2023-07-18 DIAGNOSIS — I10 Essential (primary) hypertension: Secondary | ICD-10-CM | POA: Diagnosis not present

## 2023-07-18 DIAGNOSIS — I160 Hypertensive urgency: Secondary | ICD-10-CM | POA: Diagnosis not present

## 2023-07-18 DIAGNOSIS — H9190 Unspecified hearing loss, unspecified ear: Secondary | ICD-10-CM | POA: Diagnosis not present

## 2023-07-18 DIAGNOSIS — N179 Acute kidney failure, unspecified: Secondary | ICD-10-CM | POA: Diagnosis not present

## 2023-07-18 DIAGNOSIS — F419 Anxiety disorder, unspecified: Secondary | ICD-10-CM | POA: Diagnosis not present

## 2023-07-18 DIAGNOSIS — K219 Gastro-esophageal reflux disease without esophagitis: Secondary | ICD-10-CM | POA: Diagnosis not present

## 2023-07-18 DIAGNOSIS — M6282 Rhabdomyolysis: Secondary | ICD-10-CM | POA: Diagnosis not present

## 2023-07-18 DIAGNOSIS — E876 Hypokalemia: Secondary | ICD-10-CM | POA: Diagnosis not present

## 2023-07-18 DIAGNOSIS — E039 Hypothyroidism, unspecified: Secondary | ICD-10-CM | POA: Diagnosis not present

## 2023-07-20 DIAGNOSIS — I160 Hypertensive urgency: Secondary | ICD-10-CM | POA: Diagnosis not present

## 2023-07-20 DIAGNOSIS — K219 Gastro-esophageal reflux disease without esophagitis: Secondary | ICD-10-CM | POA: Diagnosis not present

## 2023-07-20 DIAGNOSIS — F419 Anxiety disorder, unspecified: Secondary | ICD-10-CM | POA: Diagnosis not present

## 2023-07-20 DIAGNOSIS — M6282 Rhabdomyolysis: Secondary | ICD-10-CM | POA: Diagnosis not present

## 2023-07-20 DIAGNOSIS — Z87440 Personal history of urinary (tract) infections: Secondary | ICD-10-CM | POA: Diagnosis not present

## 2023-07-20 DIAGNOSIS — E039 Hypothyroidism, unspecified: Secondary | ICD-10-CM | POA: Diagnosis not present

## 2023-07-20 DIAGNOSIS — I10 Essential (primary) hypertension: Secondary | ICD-10-CM | POA: Diagnosis not present

## 2023-07-20 DIAGNOSIS — H9190 Unspecified hearing loss, unspecified ear: Secondary | ICD-10-CM | POA: Diagnosis not present

## 2023-07-20 DIAGNOSIS — N179 Acute kidney failure, unspecified: Secondary | ICD-10-CM | POA: Diagnosis not present

## 2023-07-20 DIAGNOSIS — L570 Actinic keratosis: Secondary | ICD-10-CM | POA: Diagnosis not present

## 2023-07-20 DIAGNOSIS — E876 Hypokalemia: Secondary | ICD-10-CM | POA: Diagnosis not present

## 2023-07-20 DIAGNOSIS — R2681 Unsteadiness on feet: Secondary | ICD-10-CM | POA: Diagnosis not present

## 2023-07-21 ENCOUNTER — Telehealth: Payer: Self-pay | Admitting: Nurse Practitioner

## 2023-07-21 NOTE — Telephone Encounter (Signed)
 FYI   Copied from CRM [id number]. Topic: General - Other >> Jul 20, 2023  4:59 PM [place] B wrote: Reason for CRM: pt called in wanting to let Dr Daphine Deutscher know she isnt upset with her but it going to a different practice closer to her

## 2023-07-25 DIAGNOSIS — M6282 Rhabdomyolysis: Secondary | ICD-10-CM | POA: Diagnosis not present

## 2023-07-25 DIAGNOSIS — F419 Anxiety disorder, unspecified: Secondary | ICD-10-CM | POA: Diagnosis not present

## 2023-07-25 DIAGNOSIS — I10 Essential (primary) hypertension: Secondary | ICD-10-CM | POA: Diagnosis not present

## 2023-07-25 DIAGNOSIS — E039 Hypothyroidism, unspecified: Secondary | ICD-10-CM | POA: Diagnosis not present

## 2023-07-25 DIAGNOSIS — H9190 Unspecified hearing loss, unspecified ear: Secondary | ICD-10-CM | POA: Diagnosis not present

## 2023-07-25 DIAGNOSIS — E876 Hypokalemia: Secondary | ICD-10-CM | POA: Diagnosis not present

## 2023-07-25 DIAGNOSIS — K219 Gastro-esophageal reflux disease without esophagitis: Secondary | ICD-10-CM | POA: Diagnosis not present

## 2023-07-25 DIAGNOSIS — I160 Hypertensive urgency: Secondary | ICD-10-CM | POA: Diagnosis not present

## 2023-07-25 DIAGNOSIS — N179 Acute kidney failure, unspecified: Secondary | ICD-10-CM | POA: Diagnosis not present

## 2023-07-26 DIAGNOSIS — E039 Hypothyroidism, unspecified: Secondary | ICD-10-CM | POA: Diagnosis not present

## 2023-07-26 DIAGNOSIS — N179 Acute kidney failure, unspecified: Secondary | ICD-10-CM | POA: Diagnosis not present

## 2023-07-26 DIAGNOSIS — K219 Gastro-esophageal reflux disease without esophagitis: Secondary | ICD-10-CM | POA: Diagnosis not present

## 2023-07-26 DIAGNOSIS — E876 Hypokalemia: Secondary | ICD-10-CM | POA: Diagnosis not present

## 2023-07-26 DIAGNOSIS — M6282 Rhabdomyolysis: Secondary | ICD-10-CM | POA: Diagnosis not present

## 2023-07-26 DIAGNOSIS — H9190 Unspecified hearing loss, unspecified ear: Secondary | ICD-10-CM | POA: Diagnosis not present

## 2023-07-26 DIAGNOSIS — I10 Essential (primary) hypertension: Secondary | ICD-10-CM | POA: Diagnosis not present

## 2023-07-26 DIAGNOSIS — F419 Anxiety disorder, unspecified: Secondary | ICD-10-CM | POA: Diagnosis not present

## 2023-07-26 DIAGNOSIS — I160 Hypertensive urgency: Secondary | ICD-10-CM | POA: Diagnosis not present

## 2023-07-27 DIAGNOSIS — I160 Hypertensive urgency: Secondary | ICD-10-CM | POA: Diagnosis not present

## 2023-07-27 DIAGNOSIS — H9190 Unspecified hearing loss, unspecified ear: Secondary | ICD-10-CM | POA: Diagnosis not present

## 2023-07-27 DIAGNOSIS — E876 Hypokalemia: Secondary | ICD-10-CM | POA: Diagnosis not present

## 2023-07-27 DIAGNOSIS — N179 Acute kidney failure, unspecified: Secondary | ICD-10-CM | POA: Diagnosis not present

## 2023-07-27 DIAGNOSIS — I10 Essential (primary) hypertension: Secondary | ICD-10-CM | POA: Diagnosis not present

## 2023-07-27 DIAGNOSIS — F419 Anxiety disorder, unspecified: Secondary | ICD-10-CM | POA: Diagnosis not present

## 2023-07-27 DIAGNOSIS — E039 Hypothyroidism, unspecified: Secondary | ICD-10-CM | POA: Diagnosis not present

## 2023-07-27 DIAGNOSIS — M6282 Rhabdomyolysis: Secondary | ICD-10-CM | POA: Diagnosis not present

## 2023-07-27 DIAGNOSIS — K219 Gastro-esophageal reflux disease without esophagitis: Secondary | ICD-10-CM | POA: Diagnosis not present

## 2023-08-02 DIAGNOSIS — N179 Acute kidney failure, unspecified: Secondary | ICD-10-CM | POA: Diagnosis not present

## 2023-08-02 DIAGNOSIS — I160 Hypertensive urgency: Secondary | ICD-10-CM | POA: Diagnosis not present

## 2023-08-02 DIAGNOSIS — K219 Gastro-esophageal reflux disease without esophagitis: Secondary | ICD-10-CM | POA: Diagnosis not present

## 2023-08-02 DIAGNOSIS — E876 Hypokalemia: Secondary | ICD-10-CM | POA: Diagnosis not present

## 2023-08-02 DIAGNOSIS — I10 Essential (primary) hypertension: Secondary | ICD-10-CM | POA: Diagnosis not present

## 2023-08-02 DIAGNOSIS — E039 Hypothyroidism, unspecified: Secondary | ICD-10-CM | POA: Diagnosis not present

## 2023-08-02 DIAGNOSIS — M6282 Rhabdomyolysis: Secondary | ICD-10-CM | POA: Diagnosis not present

## 2023-08-02 DIAGNOSIS — F419 Anxiety disorder, unspecified: Secondary | ICD-10-CM | POA: Diagnosis not present

## 2023-08-02 DIAGNOSIS — H9190 Unspecified hearing loss, unspecified ear: Secondary | ICD-10-CM | POA: Diagnosis not present

## 2023-08-09 DIAGNOSIS — I160 Hypertensive urgency: Secondary | ICD-10-CM | POA: Diagnosis not present

## 2023-08-09 DIAGNOSIS — E876 Hypokalemia: Secondary | ICD-10-CM | POA: Diagnosis not present

## 2023-08-09 DIAGNOSIS — I10 Essential (primary) hypertension: Secondary | ICD-10-CM | POA: Diagnosis not present

## 2023-08-09 DIAGNOSIS — N179 Acute kidney failure, unspecified: Secondary | ICD-10-CM | POA: Diagnosis not present

## 2023-08-09 DIAGNOSIS — H9190 Unspecified hearing loss, unspecified ear: Secondary | ICD-10-CM | POA: Diagnosis not present

## 2023-08-09 DIAGNOSIS — K219 Gastro-esophageal reflux disease without esophagitis: Secondary | ICD-10-CM | POA: Diagnosis not present

## 2023-08-09 DIAGNOSIS — E039 Hypothyroidism, unspecified: Secondary | ICD-10-CM | POA: Diagnosis not present

## 2023-08-09 DIAGNOSIS — M6282 Rhabdomyolysis: Secondary | ICD-10-CM | POA: Diagnosis not present

## 2023-08-09 DIAGNOSIS — F419 Anxiety disorder, unspecified: Secondary | ICD-10-CM | POA: Diagnosis not present

## 2023-08-11 DIAGNOSIS — E876 Hypokalemia: Secondary | ICD-10-CM | POA: Diagnosis not present

## 2023-08-11 DIAGNOSIS — E039 Hypothyroidism, unspecified: Secondary | ICD-10-CM | POA: Diagnosis not present

## 2023-08-11 DIAGNOSIS — K219 Gastro-esophageal reflux disease without esophagitis: Secondary | ICD-10-CM | POA: Diagnosis not present

## 2023-08-11 DIAGNOSIS — I10 Essential (primary) hypertension: Secondary | ICD-10-CM | POA: Diagnosis not present

## 2023-08-11 DIAGNOSIS — N179 Acute kidney failure, unspecified: Secondary | ICD-10-CM | POA: Diagnosis not present

## 2023-08-11 DIAGNOSIS — H9190 Unspecified hearing loss, unspecified ear: Secondary | ICD-10-CM | POA: Diagnosis not present

## 2023-08-11 DIAGNOSIS — M6282 Rhabdomyolysis: Secondary | ICD-10-CM | POA: Diagnosis not present

## 2023-08-11 DIAGNOSIS — F419 Anxiety disorder, unspecified: Secondary | ICD-10-CM | POA: Diagnosis not present

## 2023-08-11 DIAGNOSIS — I160 Hypertensive urgency: Secondary | ICD-10-CM | POA: Diagnosis not present

## 2023-08-12 ENCOUNTER — Ambulatory Visit: Payer: Medicare PPO | Admitting: Nurse Practitioner

## 2023-10-19 DIAGNOSIS — L570 Actinic keratosis: Secondary | ICD-10-CM | POA: Diagnosis not present

## 2023-10-19 DIAGNOSIS — R2681 Unsteadiness on feet: Secondary | ICD-10-CM | POA: Diagnosis not present

## 2023-10-19 DIAGNOSIS — K219 Gastro-esophageal reflux disease without esophagitis: Secondary | ICD-10-CM | POA: Diagnosis not present

## 2023-10-19 DIAGNOSIS — Z0000 Encounter for general adult medical examination without abnormal findings: Secondary | ICD-10-CM | POA: Diagnosis not present

## 2023-10-19 DIAGNOSIS — E039 Hypothyroidism, unspecified: Secondary | ICD-10-CM | POA: Diagnosis not present

## 2023-10-19 DIAGNOSIS — Z96651 Presence of right artificial knee joint: Secondary | ICD-10-CM | POA: Diagnosis not present

## 2023-10-19 DIAGNOSIS — M25561 Pain in right knee: Secondary | ICD-10-CM | POA: Diagnosis not present

## 2023-10-19 DIAGNOSIS — M25551 Pain in right hip: Secondary | ICD-10-CM | POA: Diagnosis not present

## 2023-10-19 DIAGNOSIS — F419 Anxiety disorder, unspecified: Secondary | ICD-10-CM | POA: Diagnosis not present

## 2023-10-19 DIAGNOSIS — Z87440 Personal history of urinary (tract) infections: Secondary | ICD-10-CM | POA: Diagnosis not present

## 2023-10-19 DIAGNOSIS — I10 Essential (primary) hypertension: Secondary | ICD-10-CM | POA: Diagnosis not present

## 2023-10-19 DIAGNOSIS — M1611 Unilateral primary osteoarthritis, right hip: Secondary | ICD-10-CM | POA: Diagnosis not present

## 2023-10-29 ENCOUNTER — Other Ambulatory Visit: Payer: Self-pay | Admitting: Nurse Practitioner

## 2023-10-29 DIAGNOSIS — K219 Gastro-esophageal reflux disease without esophagitis: Secondary | ICD-10-CM

## 2023-11-03 DIAGNOSIS — M25561 Pain in right knee: Secondary | ICD-10-CM | POA: Diagnosis not present

## 2023-11-08 ENCOUNTER — Other Ambulatory Visit: Payer: Self-pay | Admitting: Nurse Practitioner

## 2023-11-08 DIAGNOSIS — K219 Gastro-esophageal reflux disease without esophagitis: Secondary | ICD-10-CM

## 2024-01-19 DIAGNOSIS — I10 Essential (primary) hypertension: Secondary | ICD-10-CM | POA: Diagnosis not present

## 2024-01-19 DIAGNOSIS — Z87440 Personal history of urinary (tract) infections: Secondary | ICD-10-CM | POA: Diagnosis not present

## 2024-01-19 DIAGNOSIS — L570 Actinic keratosis: Secondary | ICD-10-CM | POA: Diagnosis not present

## 2024-01-19 DIAGNOSIS — K219 Gastro-esophageal reflux disease without esophagitis: Secondary | ICD-10-CM | POA: Diagnosis not present

## 2024-01-19 DIAGNOSIS — F419 Anxiety disorder, unspecified: Secondary | ICD-10-CM | POA: Diagnosis not present

## 2024-01-19 DIAGNOSIS — R2681 Unsteadiness on feet: Secondary | ICD-10-CM | POA: Diagnosis not present

## 2024-01-19 DIAGNOSIS — N390 Urinary tract infection, site not specified: Secondary | ICD-10-CM | POA: Diagnosis not present

## 2024-01-19 DIAGNOSIS — E039 Hypothyroidism, unspecified: Secondary | ICD-10-CM | POA: Diagnosis not present

## 2024-02-03 DIAGNOSIS — N390 Urinary tract infection, site not specified: Secondary | ICD-10-CM | POA: Diagnosis not present

## 2024-02-08 DIAGNOSIS — N390 Urinary tract infection, site not specified: Secondary | ICD-10-CM | POA: Diagnosis not present

## 2024-04-11 DIAGNOSIS — N390 Urinary tract infection, site not specified: Secondary | ICD-10-CM | POA: Diagnosis not present
